# Patient Record
Sex: Male | Born: 1945 | Race: White | Hispanic: No | Marital: Married | Smoking: Never smoker
Health system: Southern US, Community
[De-identification: ages and names within clinical notes are randomized; demographics above are authoritative.]

## PROBLEM LIST (undated history)

## (undated) DIAGNOSIS — F419 Anxiety disorder, unspecified: Secondary | ICD-10-CM

## (undated) DIAGNOSIS — F329 Major depressive disorder, single episode, unspecified: Secondary | ICD-10-CM

## (undated) DIAGNOSIS — F32A Depression, unspecified: Secondary | ICD-10-CM

## (undated) DIAGNOSIS — G4733 Obstructive sleep apnea (adult) (pediatric): Secondary | ICD-10-CM

## (undated) DIAGNOSIS — M1990 Unspecified osteoarthritis, unspecified site: Secondary | ICD-10-CM

## (undated) DIAGNOSIS — I509 Heart failure, unspecified: Secondary | ICD-10-CM

## (undated) DIAGNOSIS — N179 Acute kidney failure, unspecified: Secondary | ICD-10-CM

## (undated) DIAGNOSIS — I4891 Unspecified atrial fibrillation: Secondary | ICD-10-CM

## (undated) DIAGNOSIS — I10 Essential (primary) hypertension: Secondary | ICD-10-CM

## (undated) DIAGNOSIS — G20A1 Parkinson's disease without dyskinesia, without mention of fluctuations: Secondary | ICD-10-CM

## (undated) DIAGNOSIS — N319 Neuromuscular dysfunction of bladder, unspecified: Secondary | ICD-10-CM

## (undated) DIAGNOSIS — F209 Schizophrenia, unspecified: Secondary | ICD-10-CM

## (undated) DIAGNOSIS — R569 Unspecified convulsions: Secondary | ICD-10-CM

## (undated) HISTORY — PX: TONSILLECTOMY: SUR1361

## (undated) HISTORY — PX: COLONOSCOPY: SHX174

## (undated) HISTORY — PX: CHOLECYSTECTOMY: SHX55

## (undated) HISTORY — DX: Unspecified convulsions: R56.9

## (undated) HISTORY — DX: Anxiety disorder, unspecified: F41.9

---

## 2002-02-03 ENCOUNTER — Ambulatory Visit (HOSPITAL_COMMUNITY): Admission: RE | Admit: 2002-02-03 | Discharge: 2002-02-03 | Payer: Self-pay | Admitting: Internal Medicine

## 2002-02-04 ENCOUNTER — Observation Stay (HOSPITAL_COMMUNITY): Admission: RE | Admit: 2002-02-04 | Discharge: 2002-02-05 | Payer: Self-pay | Admitting: General Surgery

## 2013-06-10 ENCOUNTER — Encounter (INDEPENDENT_AMBULATORY_CARE_PROVIDER_SITE_OTHER): Payer: Self-pay | Admitting: *Deleted

## 2013-06-14 ENCOUNTER — Other Ambulatory Visit (INDEPENDENT_AMBULATORY_CARE_PROVIDER_SITE_OTHER): Payer: Self-pay | Admitting: *Deleted

## 2013-06-14 ENCOUNTER — Telehealth (INDEPENDENT_AMBULATORY_CARE_PROVIDER_SITE_OTHER): Payer: Self-pay | Admitting: *Deleted

## 2013-06-14 ENCOUNTER — Encounter (INDEPENDENT_AMBULATORY_CARE_PROVIDER_SITE_OTHER): Payer: Self-pay | Admitting: *Deleted

## 2013-06-14 DIAGNOSIS — Z1211 Encounter for screening for malignant neoplasm of colon: Secondary | ICD-10-CM

## 2013-06-14 MED ORDER — PEG-KCL-NACL-NASULF-NA ASC-C 100 G PO SOLR: 1.0000 | Freq: Once | ORAL | Status: DC

## 2013-06-14 NOTE — Telephone Encounter (Signed)
Patient needs movi prep 

## 2013-07-07 DIAGNOSIS — R55 Syncope and collapse: Secondary | ICD-10-CM

## 2013-07-13 ENCOUNTER — Ambulatory Visit (INDEPENDENT_AMBULATORY_CARE_PROVIDER_SITE_OTHER): Payer: Medicare Other | Admitting: Radiology

## 2013-07-13 DIAGNOSIS — R404 Transient alteration of awareness: Secondary | ICD-10-CM

## 2013-07-13 NOTE — Procedures (Signed)
  History:  Cory Pacheco is a 67 year old gentleman with a history of seizures with generalized tonic-clonic episodes, new onset. The patient was just discharged from the hospital on 07/08/2013 after having a generalized seizure at home. The patient is being evaluated for this.  This is a routine EEG. No skull defects are noted. Medications include aspirin, Depakote, folic acid, hydrochlorothiazide, potassium, lisinopril, Norvasc, Pravachol, Risperdal, and Xanax.   EEG classification: Normal awake  Description of the recording: The background rhythms of this recording consists of a fairly well modulated medium amplitude alpha rhythm of 8 Hz that is reactive to eye opening and closure. As the record progresses, the patient appears to remain in the waking state throughout the recording. Photic stimulation was performed, resulting in a bilateral and symmetric photic driving response. Hyperventilation was also performed, resulting in a minimal buildup of the background rhythm activities without significant slowing seen. At no time during the recording does there appear to be evidence of spike or spike wave discharges or evidence of focal slowing. EKG monitor shows no evidence of cardiac rhythm abnormalities with a heart rate of 84.  Impression: This is a normal EEG recording in the waking state. No evidence of ictal or interictal discharges are seen.

## 2013-07-14 ENCOUNTER — Telehealth (INDEPENDENT_AMBULATORY_CARE_PROVIDER_SITE_OTHER): Payer: Self-pay | Admitting: *Deleted

## 2013-07-14 NOTE — Telephone Encounter (Signed)
  Procedure: tcs  Reason/Indication:  screening  Has patient had this procedure before?  Yes, 11 yrs ago  If so, when, by whom and where?    Is there a family history of colon cancer?  no  Who?  What age when diagnosed?    Is patient diabetic?   no      Does patient have prosthetic heart valve?  no  Do you have a pacemaker?  no  Has patient ever had endocarditis? no  Has patient had joint replacement within last 12 months?  no  Is patient on Coumadin, Plavix and/or Aspirin? yes  Medications: asa 81 mg daily, hctz 25 mg daily, lisinopril 10 mg 1/2 tab daily, pravastatin 10 mg daily, divalproex 500 mg daily, amlodipine 10 mg 1/2 tab daily, alprazolam 0.5 mg bid, risperidone 2 mg 1/2 tab bid, folic acid 1 mg 1 tab daily, potassium 20 meq daily, bupropion 150 mg bid, geratol plus daily, vit d 2000 mg daily, fish oil bid, glucosamine 1500 mg daily, fiber therapy prn   Allergies: nkda  Medication Adjustment: asa 2 days  Procedure date & time: 08/03/13 at 930

## 2013-07-15 NOTE — Telephone Encounter (Signed)
agree

## 2013-07-19 ENCOUNTER — Encounter (HOSPITAL_COMMUNITY): Payer: Self-pay | Admitting: Pharmacy Technician

## 2013-07-29 ENCOUNTER — Ambulatory Visit: Payer: Medicare Other | Admitting: Diagnostic Neuroimaging

## 2013-08-03 ENCOUNTER — Encounter (HOSPITAL_COMMUNITY): Payer: Self-pay | Admitting: *Deleted

## 2013-08-03 ENCOUNTER — Ambulatory Visit (HOSPITAL_COMMUNITY)
Admission: RE | Admit: 2013-08-03 | Discharge: 2013-08-03 | Disposition: A | Discharge status: Home / Self Care | Payer: Medicare Other | Source: Ambulatory Visit | Attending: Internal Medicine | Admitting: Internal Medicine

## 2013-08-03 ENCOUNTER — Encounter (HOSPITAL_COMMUNITY)
Admission: RE | Disposition: A | Discharge status: Home / Self Care | Payer: Self-pay | Source: Ambulatory Visit | Attending: Internal Medicine

## 2013-08-03 DIAGNOSIS — I10 Essential (primary) hypertension: Secondary | ICD-10-CM | POA: Insufficient documentation

## 2013-08-03 DIAGNOSIS — F329 Major depressive disorder, single episode, unspecified: Secondary | ICD-10-CM | POA: Insufficient documentation

## 2013-08-03 DIAGNOSIS — Q438 Other specified congenital malformations of intestine: Secondary | ICD-10-CM | POA: Insufficient documentation

## 2013-08-03 DIAGNOSIS — F3289 Other specified depressive episodes: Secondary | ICD-10-CM | POA: Insufficient documentation

## 2013-08-03 DIAGNOSIS — Z1211 Encounter for screening for malignant neoplasm of colon: Secondary | ICD-10-CM | POA: Insufficient documentation

## 2013-08-03 DIAGNOSIS — Z79899 Other long term (current) drug therapy: Secondary | ICD-10-CM | POA: Insufficient documentation

## 2013-08-03 DIAGNOSIS — M129 Arthropathy, unspecified: Secondary | ICD-10-CM | POA: Insufficient documentation

## 2013-08-03 HISTORY — PX: COLONOSCOPY: SHX5424

## 2013-08-03 HISTORY — DX: Depression, unspecified: F32.A

## 2013-08-03 HISTORY — DX: Major depressive disorder, single episode, unspecified: F32.9

## 2013-08-03 HISTORY — DX: Essential (primary) hypertension: I10

## 2013-08-03 HISTORY — DX: Unspecified osteoarthritis, unspecified site: M19.90

## 2013-08-03 SURGERY — COLONOSCOPY
Anesthesia: Moderate Sedation

## 2013-08-03 MED ORDER — MIDAZOLAM HCL 5 MG/5ML IJ SOLN
INTRAMUSCULAR | Status: AC
  Filled 2013-08-03: qty 10

## 2013-08-03 MED ORDER — MIDAZOLAM HCL 5 MG/5ML IJ SOLN
INTRAMUSCULAR | Status: DC | PRN
  Administered 2013-08-03: 1 mg via INTRAVENOUS
  Administered 2013-08-03 (×3): 2 mg via INTRAVENOUS

## 2013-08-03 MED ORDER — STERILE WATER FOR IRRIGATION IR SOLN
Status: DC | PRN
  Administered 2013-08-03: 09:00:00

## 2013-08-03 MED ORDER — SODIUM CHLORIDE 0.9 % IV SOLN
INTRAVENOUS | Status: DC
  Administered 2013-08-03: 09:00:00 via INTRAVENOUS

## 2013-08-03 MED ORDER — MEPERIDINE HCL 50 MG/ML IJ SOLN
INTRAMUSCULAR | Status: DC | PRN
  Administered 2013-08-03: 25 mg via INTRAVENOUS

## 2013-08-03 MED ORDER — MEPERIDINE HCL 50 MG/ML IJ SOLN
INTRAMUSCULAR | Status: AC
  Filled 2013-08-03: qty 1

## 2013-08-03 NOTE — H&P (Signed)
Cory Pacheco is an 67 y.o. male.   Chief Complaint: Patient is here for colonoscopy. HPI: Patient is 67 year old Caucasian male who presents for screening colonoscopy. His Lasix was 11 years ago. He denies abdominal pain change in his bowel habits or rectal bleeding. Family history is negative for CRC or polyps.  Past Medical History  Diagnosis Date  . Hypertension   . Depression   . Arthritis     Past Surgical History  Procedure Laterality Date  . Cholecystectomy    . Colonoscopy    . Tonsillectomy      History reviewed. No pertinent family history. Social History:  reports that he has never smoked. He does not have any smokeless tobacco history on file. His alcohol and drug histories are not on file.  Allergies: No Known Allergies  Medications Prior to Admission  Medication Sig Dispense Refill  . ALPRAZolam (XANAX) 0.25 MG tablet Take 0.25 mg by mouth 2 (two) times daily.      Marland Kitchen amLODipine (NORVASC) 2.5 MG tablet Take 1.25 mg by mouth daily.      Marland Kitchen aspirin EC 81 MG tablet Take 81 mg by mouth daily.      . Calcium Citrate (CITRACAL PO) Take 1 tablet by mouth daily.      . Cholecalciferol (VITAMIN D3) 2000 UNITS TABS Take 1 tablet by mouth daily.      . divalproex (DEPAKOTE) 125 MG DR tablet Take 125 mg by mouth at bedtime.      . folic acid (FOLVITE) 1 MG tablet Take 2 mg by mouth daily.      . Glucosamine HCl-MSM (MSM GLUCOSAMINE PO) Take 1,500 mg by mouth daily.      . hydrochlorothiazide (MICROZIDE) 12.5 MG capsule Take 12.5 mg by mouth daily.      Marland Kitchen lisinopril (PRINIVIL,ZESTRIL) 2.5 MG tablet Take 1.25 mg by mouth daily.      . peg 3350 powder (MOVIPREP) 100 G SOLR Take 1 kit (100 g total) by mouth once.  1 kit  0  . potassium chloride (MICRO-K) 10 MEQ CR capsule Take 10 mEq by mouth daily.      . pravastatin (PRAVACHOL) 10 MG tablet Take 10 mg by mouth daily.      . risperiDONE (RISPERDAL) 0.25 MG tablet Take 0.125 mg by mouth 2 (two) times daily.        No results  found for this or any previous visit (from the past 48 hour(s)). No results found.  ROS  Blood pressure 134/78, pulse 82, temperature 97.7 F (36.5 C), temperature source Oral, resp. rate 17, height 5\' 11"  (1.803 m), weight 215 lb (97.523 kg), SpO2 93.00%. Physical Exam  Constitutional: He appears well-developed and well-nourished.  HENT:  Mouth/Throat: Oropharynx is clear and moist.  Eyes: Conjunctivae are normal. No scleral icterus.  Neck: No thyromegaly present.  Cardiovascular: Normal rate, regular rhythm and normal heart sounds.   No murmur heard. Respiratory: Effort normal and breath sounds normal.  GI: Soft. He exhibits no distension and no mass. There is no tenderness.  Musculoskeletal: He exhibits no edema.  Lymphadenopathy:    He has no cervical adenopathy.  Neurological: He is alert.  Skin: Skin is warm and dry.     Assessment/Plan Average risk screening colonoscopy.  Bettyjean Stefanski U 08/03/2013, 8:56 AM

## 2013-08-03 NOTE — Op Note (Signed)
COLONOSCOPY PROCEDURE REPORT  PATIENT:  Cory Pacheco  MR#:  [id number] Birthdate:  Jan 21, 1946, 67 y.o., male Endoscopist:  Dr. Malissa Hippo, MD Referred By:  Dr. Donzetta Sprung, MD Procedure Date: 08/03/2013  Procedure:   Colonoscopy  Indications:  Patient is 67 year old Caucasian male who is undergoing average risk screening colonoscopy.  Informed Consent:  The procedure and risks were reviewed with the patient and informed consent was obtained.  Medications:  Demerol 25 mg IV Versed 7 mg IV  Description of procedure:  After a digital rectal exam was performed, that colonoscope was advanced from the anus through the rectum and colon to the area of the cecum, ileocecal valve and appendiceal orifice. The cecum was deeply intubated. These structures were well-seen and photographed for the record. From the level of the cecum and ileocecal valve, the scope was slowly and cautiously withdrawn. The mucosal surfaces were carefully surveyed utilizing scope tip to flexion to facilitate fold flattening as needed. The scope was pulled down into the rectum where a thorough exam including retroflexion was performed.  Findings:  Prep satisfactory. He had a lot of liquid stool. Redundant colon but normal mucosa throughout without polyps or diverticular changes. Normal rectal mucosa and anal rectal junction.   Therapeutic/Diagnostic Maneuvers Performed:  None  Complications:  None  Cecal Withdrawal Time:  11 minutes  Impression:  Examination performed to cecum. Redundant but otherwise normal colon.  Recommendations:  Standard instructions given. Next screening exam in 10 years.  REHMAN,NAJEEB U  08/03/2013 9:35 AM  CC: Dr. Donzetta Sprung, MD & Dr. Bonnetta Barry ref. provider found

## 2013-08-04 ENCOUNTER — Encounter (HOSPITAL_COMMUNITY): Payer: Self-pay | Admitting: Internal Medicine

## 2016-02-19 DIAGNOSIS — I10 Essential (primary) hypertension: Secondary | ICD-10-CM | POA: Diagnosis not present

## 2016-02-19 DIAGNOSIS — F3132 Bipolar disorder, current episode depressed, moderate: Secondary | ICD-10-CM | POA: Diagnosis not present

## 2016-02-19 DIAGNOSIS — E782 Mixed hyperlipidemia: Secondary | ICD-10-CM | POA: Diagnosis not present

## 2016-02-19 DIAGNOSIS — E119 Type 2 diabetes mellitus without complications: Secondary | ICD-10-CM | POA: Diagnosis not present

## 2016-02-19 DIAGNOSIS — K210 Gastro-esophageal reflux disease with esophagitis: Secondary | ICD-10-CM | POA: Diagnosis not present

## 2016-02-29 DIAGNOSIS — G40309 Generalized idiopathic epilepsy and epileptic syndromes, not intractable, without status epilepticus: Secondary | ICD-10-CM | POA: Diagnosis not present

## 2016-02-29 DIAGNOSIS — K219 Gastro-esophageal reflux disease without esophagitis: Secondary | ICD-10-CM | POA: Diagnosis not present

## 2016-02-29 DIAGNOSIS — F3132 Bipolar disorder, current episode depressed, moderate: Secondary | ICD-10-CM | POA: Diagnosis not present

## 2016-02-29 DIAGNOSIS — E782 Mixed hyperlipidemia: Secondary | ICD-10-CM | POA: Diagnosis not present

## 2016-02-29 DIAGNOSIS — I10 Essential (primary) hypertension: Secondary | ICD-10-CM | POA: Diagnosis not present

## 2016-02-29 DIAGNOSIS — Z0001 Encounter for general adult medical examination with abnormal findings: Secondary | ICD-10-CM | POA: Diagnosis not present

## 2016-02-29 DIAGNOSIS — Z1212 Encounter for screening for malignant neoplasm of rectum: Secondary | ICD-10-CM | POA: Diagnosis not present

## 2016-06-27 DIAGNOSIS — F3132 Bipolar disorder, current episode depressed, moderate: Secondary | ICD-10-CM | POA: Diagnosis not present

## 2016-06-27 DIAGNOSIS — E119 Type 2 diabetes mellitus without complications: Secondary | ICD-10-CM | POA: Diagnosis not present

## 2016-06-27 DIAGNOSIS — G40309 Generalized idiopathic epilepsy and epileptic syndromes, not intractable, without status epilepticus: Secondary | ICD-10-CM | POA: Diagnosis not present

## 2016-06-27 DIAGNOSIS — I10 Essential (primary) hypertension: Secondary | ICD-10-CM | POA: Diagnosis not present

## 2016-06-27 DIAGNOSIS — K210 Gastro-esophageal reflux disease with esophagitis: Secondary | ICD-10-CM | POA: Diagnosis not present

## 2016-06-27 DIAGNOSIS — E782 Mixed hyperlipidemia: Secondary | ICD-10-CM | POA: Diagnosis not present

## 2016-06-30 DIAGNOSIS — K219 Gastro-esophageal reflux disease without esophagitis: Secondary | ICD-10-CM | POA: Diagnosis not present

## 2016-06-30 DIAGNOSIS — G4733 Obstructive sleep apnea (adult) (pediatric): Secondary | ICD-10-CM | POA: Diagnosis not present

## 2016-06-30 DIAGNOSIS — F3132 Bipolar disorder, current episode depressed, moderate: Secondary | ICD-10-CM | POA: Diagnosis not present

## 2016-06-30 DIAGNOSIS — G40309 Generalized idiopathic epilepsy and epileptic syndromes, not intractable, without status epilepticus: Secondary | ICD-10-CM | POA: Diagnosis not present

## 2016-06-30 DIAGNOSIS — I10 Essential (primary) hypertension: Secondary | ICD-10-CM | POA: Diagnosis not present

## 2016-06-30 DIAGNOSIS — E782 Mixed hyperlipidemia: Secondary | ICD-10-CM | POA: Diagnosis not present

## 2016-06-30 DIAGNOSIS — E119 Type 2 diabetes mellitus without complications: Secondary | ICD-10-CM | POA: Diagnosis not present

## 2016-06-30 DIAGNOSIS — Z6836 Body mass index (BMI) 36.0-36.9, adult: Secondary | ICD-10-CM | POA: Diagnosis not present

## 2016-09-29 DIAGNOSIS — Z23 Encounter for immunization: Secondary | ICD-10-CM | POA: Diagnosis not present

## 2016-10-27 DIAGNOSIS — E119 Type 2 diabetes mellitus without complications: Secondary | ICD-10-CM | POA: Diagnosis not present

## 2016-10-27 DIAGNOSIS — I10 Essential (primary) hypertension: Secondary | ICD-10-CM | POA: Diagnosis not present

## 2016-10-27 DIAGNOSIS — E782 Mixed hyperlipidemia: Secondary | ICD-10-CM | POA: Diagnosis not present

## 2016-10-31 DIAGNOSIS — K219 Gastro-esophageal reflux disease without esophagitis: Secondary | ICD-10-CM | POA: Diagnosis not present

## 2016-10-31 DIAGNOSIS — E782 Mixed hyperlipidemia: Secondary | ICD-10-CM | POA: Diagnosis not present

## 2016-10-31 DIAGNOSIS — E119 Type 2 diabetes mellitus without complications: Secondary | ICD-10-CM | POA: Diagnosis not present

## 2016-10-31 DIAGNOSIS — Z6836 Body mass index (BMI) 36.0-36.9, adult: Secondary | ICD-10-CM | POA: Diagnosis not present

## 2016-10-31 DIAGNOSIS — F3132 Bipolar disorder, current episode depressed, moderate: Secondary | ICD-10-CM | POA: Diagnosis not present

## 2016-10-31 DIAGNOSIS — G40309 Generalized idiopathic epilepsy and epileptic syndromes, not intractable, without status epilepticus: Secondary | ICD-10-CM | POA: Diagnosis not present

## 2016-10-31 DIAGNOSIS — I10 Essential (primary) hypertension: Secondary | ICD-10-CM | POA: Diagnosis not present

## 2016-10-31 DIAGNOSIS — G4733 Obstructive sleep apnea (adult) (pediatric): Secondary | ICD-10-CM | POA: Diagnosis not present

## 2016-11-15 DIAGNOSIS — E7800 Pure hypercholesterolemia, unspecified: Secondary | ICD-10-CM | POA: Diagnosis not present

## 2016-11-15 DIAGNOSIS — R7989 Other specified abnormal findings of blood chemistry: Secondary | ICD-10-CM | POA: Diagnosis not present

## 2016-11-15 DIAGNOSIS — D529 Folate deficiency anemia, unspecified: Secondary | ICD-10-CM | POA: Diagnosis not present

## 2016-11-15 DIAGNOSIS — I10 Essential (primary) hypertension: Secondary | ICD-10-CM | POA: Diagnosis not present

## 2016-11-15 DIAGNOSIS — F209 Schizophrenia, unspecified: Secondary | ICD-10-CM | POA: Diagnosis not present

## 2016-11-15 DIAGNOSIS — F319 Bipolar disorder, unspecified: Secondary | ICD-10-CM | POA: Diagnosis not present

## 2016-11-15 DIAGNOSIS — J9811 Atelectasis: Secondary | ICD-10-CM | POA: Diagnosis not present

## 2016-11-15 DIAGNOSIS — Z79899 Other long term (current) drug therapy: Secondary | ICD-10-CM | POA: Diagnosis not present

## 2016-11-15 DIAGNOSIS — R569 Unspecified convulsions: Secondary | ICD-10-CM | POA: Diagnosis not present

## 2016-11-15 DIAGNOSIS — G40409 Other generalized epilepsy and epileptic syndromes, not intractable, without status epilepticus: Secondary | ICD-10-CM | POA: Diagnosis not present

## 2016-11-17 DIAGNOSIS — Z6835 Body mass index (BMI) 35.0-35.9, adult: Secondary | ICD-10-CM | POA: Diagnosis not present

## 2016-11-17 DIAGNOSIS — G40309 Generalized idiopathic epilepsy and epileptic syndromes, not intractable, without status epilepticus: Secondary | ICD-10-CM | POA: Diagnosis not present

## 2016-11-24 DIAGNOSIS — G40309 Generalized idiopathic epilepsy and epileptic syndromes, not intractable, without status epilepticus: Secondary | ICD-10-CM | POA: Diagnosis not present

## 2016-11-24 DIAGNOSIS — E782 Mixed hyperlipidemia: Secondary | ICD-10-CM | POA: Diagnosis not present

## 2016-11-24 DIAGNOSIS — F3132 Bipolar disorder, current episode depressed, moderate: Secondary | ICD-10-CM | POA: Diagnosis not present

## 2016-11-24 DIAGNOSIS — I10 Essential (primary) hypertension: Secondary | ICD-10-CM | POA: Diagnosis not present

## 2016-11-24 DIAGNOSIS — E1065 Type 1 diabetes mellitus with hyperglycemia: Secondary | ICD-10-CM | POA: Diagnosis not present

## 2016-11-24 DIAGNOSIS — Z9189 Other specified personal risk factors, not elsewhere classified: Secondary | ICD-10-CM | POA: Diagnosis not present

## 2016-11-24 DIAGNOSIS — K210 Gastro-esophageal reflux disease with esophagitis: Secondary | ICD-10-CM | POA: Diagnosis not present

## 2017-02-01 DIAGNOSIS — Z79899 Other long term (current) drug therapy: Secondary | ICD-10-CM | POA: Diagnosis not present

## 2017-02-01 DIAGNOSIS — F209 Schizophrenia, unspecified: Secondary | ICD-10-CM | POA: Diagnosis not present

## 2017-02-01 DIAGNOSIS — Z882 Allergy status to sulfonamides status: Secondary | ICD-10-CM | POA: Diagnosis not present

## 2017-02-01 DIAGNOSIS — R42 Dizziness and giddiness: Secondary | ICD-10-CM | POA: Diagnosis not present

## 2017-02-01 DIAGNOSIS — E785 Hyperlipidemia, unspecified: Secondary | ICD-10-CM | POA: Diagnosis not present

## 2017-02-01 DIAGNOSIS — W19XXXA Unspecified fall, initial encounter: Secondary | ICD-10-CM | POA: Diagnosis not present

## 2017-02-01 DIAGNOSIS — R410 Disorientation, unspecified: Secondary | ICD-10-CM | POA: Diagnosis not present

## 2017-02-01 DIAGNOSIS — G40409 Other generalized epilepsy and epileptic syndromes, not intractable, without status epilepticus: Secondary | ICD-10-CM | POA: Diagnosis not present

## 2017-02-01 DIAGNOSIS — Z7982 Long term (current) use of aspirin: Secondary | ICD-10-CM | POA: Diagnosis not present

## 2017-02-01 DIAGNOSIS — Z818 Family history of other mental and behavioral disorders: Secondary | ICD-10-CM | POA: Diagnosis not present

## 2017-02-01 DIAGNOSIS — I10 Essential (primary) hypertension: Secondary | ICD-10-CM | POA: Diagnosis not present

## 2017-02-01 DIAGNOSIS — Z9049 Acquired absence of other specified parts of digestive tract: Secondary | ICD-10-CM | POA: Diagnosis not present

## 2017-02-01 DIAGNOSIS — Z981 Arthrodesis status: Secondary | ICD-10-CM | POA: Diagnosis not present

## 2017-02-01 DIAGNOSIS — E876 Hypokalemia: Secondary | ICD-10-CM | POA: Diagnosis not present

## 2017-02-01 DIAGNOSIS — Z809 Family history of malignant neoplasm, unspecified: Secondary | ICD-10-CM | POA: Diagnosis not present

## 2017-02-01 DIAGNOSIS — Z85828 Personal history of other malignant neoplasm of skin: Secondary | ICD-10-CM | POA: Diagnosis not present

## 2017-02-01 DIAGNOSIS — Z888 Allergy status to other drugs, medicaments and biological substances status: Secondary | ICD-10-CM | POA: Diagnosis not present

## 2017-02-01 DIAGNOSIS — M4810 Ankylosing hyperostosis [Forestier], site unspecified: Secondary | ICD-10-CM | POA: Diagnosis not present

## 2017-02-01 DIAGNOSIS — E119 Type 2 diabetes mellitus without complications: Secondary | ICD-10-CM | POA: Diagnosis not present

## 2017-02-01 DIAGNOSIS — E871 Hypo-osmolality and hyponatremia: Secondary | ICD-10-CM | POA: Diagnosis not present

## 2017-02-01 DIAGNOSIS — Z9852 Vasectomy status: Secondary | ICD-10-CM | POA: Diagnosis not present

## 2017-02-01 DIAGNOSIS — R270 Ataxia, unspecified: Secondary | ICD-10-CM | POA: Diagnosis not present

## 2017-02-01 DIAGNOSIS — D72819 Decreased white blood cell count, unspecified: Secondary | ICD-10-CM | POA: Diagnosis not present

## 2017-02-01 DIAGNOSIS — S5012XA Contusion of left forearm, initial encounter: Secondary | ICD-10-CM | POA: Diagnosis not present

## 2017-02-03 DIAGNOSIS — E871 Hypo-osmolality and hyponatremia: Secondary | ICD-10-CM | POA: Diagnosis not present

## 2017-02-27 DIAGNOSIS — E782 Mixed hyperlipidemia: Secondary | ICD-10-CM | POA: Diagnosis not present

## 2017-02-27 DIAGNOSIS — E1065 Type 1 diabetes mellitus with hyperglycemia: Secondary | ICD-10-CM | POA: Diagnosis not present

## 2017-02-27 DIAGNOSIS — I10 Essential (primary) hypertension: Secondary | ICD-10-CM | POA: Diagnosis not present

## 2017-02-27 DIAGNOSIS — K210 Gastro-esophageal reflux disease with esophagitis: Secondary | ICD-10-CM | POA: Diagnosis not present

## 2017-02-27 DIAGNOSIS — G4733 Obstructive sleep apnea (adult) (pediatric): Secondary | ICD-10-CM | POA: Diagnosis not present

## 2017-02-27 DIAGNOSIS — Z9189 Other specified personal risk factors, not elsewhere classified: Secondary | ICD-10-CM | POA: Diagnosis not present

## 2017-02-27 DIAGNOSIS — F3132 Bipolar disorder, current episode depressed, moderate: Secondary | ICD-10-CM | POA: Diagnosis not present

## 2017-02-27 DIAGNOSIS — G40309 Generalized idiopathic epilepsy and epileptic syndromes, not intractable, without status epilepticus: Secondary | ICD-10-CM | POA: Diagnosis not present

## 2017-03-03 DIAGNOSIS — Z1212 Encounter for screening for malignant neoplasm of rectum: Secondary | ICD-10-CM | POA: Diagnosis not present

## 2017-03-03 DIAGNOSIS — I10 Essential (primary) hypertension: Secondary | ICD-10-CM | POA: Diagnosis not present

## 2017-03-03 DIAGNOSIS — K219 Gastro-esophageal reflux disease without esophagitis: Secondary | ICD-10-CM | POA: Diagnosis not present

## 2017-03-03 DIAGNOSIS — E782 Mixed hyperlipidemia: Secondary | ICD-10-CM | POA: Diagnosis not present

## 2017-03-03 DIAGNOSIS — G40309 Generalized idiopathic epilepsy and epileptic syndromes, not intractable, without status epilepticus: Secondary | ICD-10-CM | POA: Diagnosis not present

## 2017-03-03 DIAGNOSIS — E119 Type 2 diabetes mellitus without complications: Secondary | ICD-10-CM | POA: Diagnosis not present

## 2017-03-03 DIAGNOSIS — F3132 Bipolar disorder, current episode depressed, moderate: Secondary | ICD-10-CM | POA: Diagnosis not present

## 2017-03-03 DIAGNOSIS — Z0001 Encounter for general adult medical examination with abnormal findings: Secondary | ICD-10-CM | POA: Diagnosis not present

## 2017-03-05 DIAGNOSIS — G40309 Generalized idiopathic epilepsy and epileptic syndromes, not intractable, without status epilepticus: Secondary | ICD-10-CM | POA: Diagnosis not present

## 2017-03-05 DIAGNOSIS — Z6834 Body mass index (BMI) 34.0-34.9, adult: Secondary | ICD-10-CM | POA: Diagnosis not present

## 2017-03-05 DIAGNOSIS — M4810 Ankylosing hyperostosis [Forestier], site unspecified: Secondary | ICD-10-CM | POA: Diagnosis not present

## 2017-04-26 DIAGNOSIS — R569 Unspecified convulsions: Secondary | ICD-10-CM | POA: Diagnosis not present

## 2017-04-26 DIAGNOSIS — Z79899 Other long term (current) drug therapy: Secondary | ICD-10-CM | POA: Diagnosis not present

## 2017-04-26 DIAGNOSIS — E7800 Pure hypercholesterolemia, unspecified: Secondary | ICD-10-CM | POA: Diagnosis not present

## 2017-04-26 DIAGNOSIS — F419 Anxiety disorder, unspecified: Secondary | ICD-10-CM | POA: Diagnosis not present

## 2017-04-26 DIAGNOSIS — I10 Essential (primary) hypertension: Secondary | ICD-10-CM | POA: Diagnosis not present

## 2017-04-26 DIAGNOSIS — G40409 Other generalized epilepsy and epileptic syndromes, not intractable, without status epilepticus: Secondary | ICD-10-CM | POA: Diagnosis not present

## 2017-05-05 ENCOUNTER — Inpatient Hospital Stay (HOSPITAL_COMMUNITY): Payer: PPO

## 2017-05-05 ENCOUNTER — Inpatient Hospital Stay (HOSPITAL_COMMUNITY)
Admission: AD | Admit: 2017-05-05 | Discharge: 2017-05-12 | DRG: 100 | Disposition: A | Discharge status: Skilled Nursing Facility | Payer: PPO | Source: Other Acute Inpatient Hospital | Attending: Internal Medicine | Admitting: Internal Medicine

## 2017-05-05 ENCOUNTER — Encounter (HOSPITAL_COMMUNITY): Payer: Self-pay

## 2017-05-05 DIAGNOSIS — K219 Gastro-esophageal reflux disease without esophagitis: Secondary | ICD-10-CM | POA: Diagnosis present

## 2017-05-05 DIAGNOSIS — E7800 Pure hypercholesterolemia, unspecified: Secondary | ICD-10-CM | POA: Diagnosis not present

## 2017-05-05 DIAGNOSIS — Z7982 Long term (current) use of aspirin: Secondary | ICD-10-CM

## 2017-05-05 DIAGNOSIS — M6281 Muscle weakness (generalized): Secondary | ICD-10-CM | POA: Diagnosis not present

## 2017-05-05 DIAGNOSIS — M1990 Unspecified osteoarthritis, unspecified site: Secondary | ICD-10-CM | POA: Diagnosis present

## 2017-05-05 DIAGNOSIS — E119 Type 2 diabetes mellitus without complications: Secondary | ICD-10-CM | POA: Diagnosis not present

## 2017-05-05 DIAGNOSIS — J9600 Acute respiratory failure, unspecified whether with hypoxia or hypercapnia: Secondary | ICD-10-CM | POA: Diagnosis not present

## 2017-05-05 DIAGNOSIS — Z8249 Family history of ischemic heart disease and other diseases of the circulatory system: Secondary | ICD-10-CM | POA: Diagnosis not present

## 2017-05-05 DIAGNOSIS — Z4659 Encounter for fitting and adjustment of other gastrointestinal appliance and device: Secondary | ICD-10-CM | POA: Diagnosis not present

## 2017-05-05 DIAGNOSIS — G40909 Epilepsy, unspecified, not intractable, without status epilepticus: Secondary | ICD-10-CM | POA: Diagnosis not present

## 2017-05-05 DIAGNOSIS — R569 Unspecified convulsions: Secondary | ICD-10-CM

## 2017-05-05 DIAGNOSIS — J9690 Respiratory failure, unspecified, unspecified whether with hypoxia or hypercapnia: Secondary | ICD-10-CM | POA: Diagnosis not present

## 2017-05-05 DIAGNOSIS — R2681 Unsteadiness on feet: Secondary | ICD-10-CM | POA: Diagnosis not present

## 2017-05-05 DIAGNOSIS — E785 Hyperlipidemia, unspecified: Secondary | ICD-10-CM | POA: Diagnosis present

## 2017-05-05 DIAGNOSIS — Z9049 Acquired absence of other specified parts of digestive tract: Secondary | ICD-10-CM | POA: Diagnosis not present

## 2017-05-05 DIAGNOSIS — Z79899 Other long term (current) drug therapy: Secondary | ICD-10-CM

## 2017-05-05 DIAGNOSIS — Z4682 Encounter for fitting and adjustment of non-vascular catheter: Secondary | ICD-10-CM | POA: Diagnosis not present

## 2017-05-05 DIAGNOSIS — E872 Acidosis: Secondary | ICD-10-CM | POA: Diagnosis present

## 2017-05-05 DIAGNOSIS — E876 Hypokalemia: Secondary | ICD-10-CM | POA: Diagnosis present

## 2017-05-05 DIAGNOSIS — I10 Essential (primary) hypertension: Secondary | ICD-10-CM | POA: Diagnosis present

## 2017-05-05 DIAGNOSIS — R4182 Altered mental status, unspecified: Secondary | ICD-10-CM | POA: Diagnosis not present

## 2017-05-05 DIAGNOSIS — R1312 Dysphagia, oropharyngeal phase: Secondary | ICD-10-CM | POA: Diagnosis not present

## 2017-05-05 DIAGNOSIS — L89151 Pressure ulcer of sacral region, stage 1: Secondary | ICD-10-CM | POA: Diagnosis present

## 2017-05-05 DIAGNOSIS — G40901 Epilepsy, unspecified, not intractable, with status epilepticus: Principal | ICD-10-CM

## 2017-05-05 DIAGNOSIS — L8990 Pressure ulcer of unspecified site, unspecified stage: Secondary | ICD-10-CM | POA: Insufficient documentation

## 2017-05-05 DIAGNOSIS — R34 Anuria and oliguria: Secondary | ICD-10-CM | POA: Diagnosis not present

## 2017-05-05 DIAGNOSIS — F419 Anxiety disorder, unspecified: Secondary | ICD-10-CM | POA: Diagnosis not present

## 2017-05-05 DIAGNOSIS — F319 Bipolar disorder, unspecified: Secondary | ICD-10-CM | POA: Diagnosis not present

## 2017-05-05 DIAGNOSIS — R41841 Cognitive communication deficit: Secondary | ICD-10-CM | POA: Diagnosis not present

## 2017-05-05 DIAGNOSIS — G9340 Encephalopathy, unspecified: Secondary | ICD-10-CM | POA: Diagnosis not present

## 2017-05-05 DIAGNOSIS — G4700 Insomnia, unspecified: Secondary | ICD-10-CM | POA: Diagnosis not present

## 2017-05-05 DIAGNOSIS — R410 Disorientation, unspecified: Secondary | ICD-10-CM | POA: Diagnosis not present

## 2017-05-05 LAB — CBC WITH DIFFERENTIAL/PLATELET
Basophils Absolute: 0 10*3/uL (ref 0.0–0.1)
Basophils Relative: 0 %
Eosinophils Absolute: 0.1 10*3/uL (ref 0.0–0.7)
Eosinophils Relative: 1 %
HCT: 38.9 % — ABNORMAL LOW (ref 39.0–52.0)
Hemoglobin: 13.4 g/dL (ref 13.0–17.0)
Lymphocytes Relative: 20 %
Lymphs Abs: 1.6 10*3/uL (ref 0.7–4.0)
MCH: 33 pg (ref 26.0–34.0)
MCHC: 34.4 g/dL (ref 30.0–36.0)
MCV: 95.8 fL (ref 78.0–100.0)
Monocytes Absolute: 0.6 10*3/uL (ref 0.1–1.0)
Monocytes Relative: 8 %
Neutro Abs: 5.8 10*3/uL (ref 1.7–7.7)
Neutrophils Relative %: 71 %
Platelets: 199 10*3/uL (ref 150–400)
RBC: 4.06 MIL/uL — ABNORMAL LOW (ref 4.22–5.81)
RDW: 13 % (ref 11.5–15.5)
WBC: 8.2 10*3/uL (ref 4.0–10.5)

## 2017-05-05 LAB — COMPREHENSIVE METABOLIC PANEL
ALT: 17 U/L (ref 17–63)
AST: 24 U/L (ref 15–41)
Albumin: 3.2 g/dL — ABNORMAL LOW (ref 3.5–5.0)
Alkaline Phosphatase: 48 U/L (ref 38–126)
Anion gap: 8 (ref 5–15)
BUN: 7 mg/dL (ref 6–20)
CO2: 23 mmol/L (ref 22–32)
Calcium: 8.5 mg/dL — ABNORMAL LOW (ref 8.9–10.3)
Chloride: 103 mmol/L (ref 101–111)
Creatinine, Ser: 0.86 mg/dL (ref 0.61–1.24)
GFR calc Af Amer: >60 mL/min (ref >60)
GFR calc non Af Amer: >60 mL/min (ref >60)
Glucose, Bld: 116 mg/dL — ABNORMAL HIGH (ref 65–99)
Potassium: 3.4 mmol/L — ABNORMAL LOW (ref 3.5–5.1)
Sodium: 134 mmol/L — ABNORMAL LOW (ref 135–145)
Total Bilirubin: 0.8 mg/dL (ref 0.3–1.2)
Total Protein: 5.9 g/dL — ABNORMAL LOW (ref 6.5–8.1)

## 2017-05-05 LAB — BLOOD GAS, ARTERIAL
Acid-base deficit: 1.4 mmol/L (ref 0.0–2.0)
Bicarbonate: 22.1 mmol/L (ref 20.0–28.0)
Drawn by: 441371
FIO2: 40
MECHVT: 550 mL
O2 Saturation: 99.3 %
PEEP: 5 cmH2O
Patient temperature: 98.4
RATE: 24 resp/min
pCO2 arterial: 32.9 mmHg (ref 32.0–48.0)
pH, Arterial: 7.443 (ref 7.350–7.450)
pO2, Arterial: 180 mmHg — ABNORMAL HIGH (ref 83.0–108.0)

## 2017-05-05 LAB — URINALYSIS, ROUTINE W REFLEX MICROSCOPIC
Bacteria, UA: NONE SEEN
Bilirubin Urine: NEGATIVE
Glucose, UA: NEGATIVE mg/dL
Ketones, ur: 5 mg/dL — AB
Leukocytes, UA: NEGATIVE
Nitrite: NEGATIVE
Protein, ur: 30 mg/dL — AB
Specific Gravity, Urine: 1.015 (ref 1.005–1.030)
pH: 6 (ref 5.0–8.0)

## 2017-05-05 LAB — PROTIME-INR
INR: 1.15
Prothrombin Time: 14.8 seconds (ref 11.4–15.2)

## 2017-05-05 LAB — LACTIC ACID, PLASMA: Lactic Acid, Venous: 2.3 mmol/L (ref 0.5–1.9)

## 2017-05-05 LAB — PHOSPHORUS: Phosphorus: 2.1 mg/dL — ABNORMAL LOW (ref 2.5–4.6)

## 2017-05-05 LAB — PROCALCITONIN: Procalcitonin: <0.1 ng/mL

## 2017-05-05 LAB — MAGNESIUM: Magnesium: 1.8 mg/dL (ref 1.7–2.4)

## 2017-05-05 LAB — MRSA PCR SCREENING: MRSA by PCR: NEGATIVE

## 2017-05-05 LAB — VALPROIC ACID LEVEL: Valproic Acid Lvl: 60 ug/mL (ref 50.0–100.0)

## 2017-05-05 MED ORDER — SODIUM CHLORIDE 0.9 % IV SOLN
INTRAVENOUS | Status: DC
  Administered 2017-05-05 (×2): via INTRAVENOUS

## 2017-05-05 MED ORDER — SODIUM CHLORIDE 0.9 % IV SOLN
500.0000 mg | Freq: Two times a day (BID) | INTRAVENOUS | Status: DC
  Administered 2017-05-05 – 2017-05-06 (×2): 500 mg via INTRAVENOUS
  Filled 2017-05-05 (×2): qty 5

## 2017-05-05 MED ORDER — MIDAZOLAM HCL 2 MG/2ML IJ SOLN
1.0000 mg | INTRAMUSCULAR | Status: DC | PRN
  Administered 2017-05-05 – 2017-05-06 (×2): 1 mg via INTRAVENOUS
  Filled 2017-05-05 (×2): qty 2

## 2017-05-05 MED ORDER — ORAL CARE MOUTH RINSE
15.0000 mL | OROMUCOSAL | Status: DC
  Administered 2017-05-05 – 2017-05-06 (×12): 15 mL via OROMUCOSAL

## 2017-05-05 MED ORDER — POTASSIUM CHLORIDE 10 MEQ/100ML IV SOLN
10.0000 meq | INTRAVENOUS | Status: AC
  Administered 2017-05-05 – 2017-05-06 (×4): 10 meq via INTRAVENOUS
  Filled 2017-05-05: qty 100

## 2017-05-05 MED ORDER — SODIUM CHLORIDE 0.9 % IV SOLN: 500.0000 mg | Freq: Two times a day (BID) | INTRAVENOUS | Status: DC

## 2017-05-05 MED ORDER — MIDAZOLAM HCL 2 MG/2ML IJ SOLN
1.0000 mg | INTRAMUSCULAR | Status: DC | PRN
  Administered 2017-05-06 (×2): 1 mg via INTRAVENOUS
  Filled 2017-05-05 (×3): qty 2

## 2017-05-05 MED ORDER — PROPOFOL 1000 MG/100ML IV EMUL
5.0000 ug/kg/min | INTRAVENOUS | Status: DC
  Administered 2017-05-05 (×3): 41.36 ug/kg/min via INTRAVENOUS
  Administered 2017-05-05: 45 ug/kg/min via INTRAVENOUS
  Administered 2017-05-05: 41.36 ug/kg/min via INTRAVENOUS
  Administered 2017-05-06: 40 ug/kg/min via INTRAVENOUS
  Administered 2017-05-06: 35 ug/kg/min via INTRAVENOUS
  Administered 2017-05-06: 50 ug/kg/min via INTRAVENOUS
  Administered 2017-05-06: 40 ug/kg/min via INTRAVENOUS
  Filled 2017-05-05 (×9): qty 100

## 2017-05-05 MED ORDER — ACETAMINOPHEN 325 MG PO TABS: 650.0000 mg | ORAL_TABLET | ORAL | Status: DC | PRN

## 2017-05-05 MED ORDER — CHLORHEXIDINE GLUCONATE 0.12% ORAL RINSE (MEDLINE KIT)
15.0000 mL | Freq: Two times a day (BID) | OROMUCOSAL | Status: DC
  Administered 2017-05-05 – 2017-05-06 (×2): 15 mL via OROMUCOSAL

## 2017-05-05 MED ORDER — PANTOPRAZOLE SODIUM 40 MG IV SOLR
40.0000 mg | Freq: Every day | INTRAVENOUS | Status: DC
  Administered 2017-05-05 – 2017-05-08 (×4): 40 mg via INTRAVENOUS
  Filled 2017-05-05 (×5): qty 40

## 2017-05-05 MED ORDER — MIDAZOLAM HCL 5 MG/ML IJ SOLN
0.0000 mg/h | INTRAMUSCULAR | Status: DC
  Filled 2017-05-05: qty 10

## 2017-05-05 MED ORDER — HEPARIN SODIUM (PORCINE) 5000 UNIT/ML IJ SOLN
5000.0000 [IU] | Freq: Three times a day (TID) | INTRAMUSCULAR | Status: DC
  Administered 2017-05-05 – 2017-05-12 (×23): 5000 [IU] via SUBCUTANEOUS
  Filled 2017-05-05 (×23): qty 1

## 2017-05-05 NOTE — Progress Notes (Signed)
Critical Lctic Acid of 2.3 Paged and spoke with Georgann Housekeeper PA. No orders, level is greatly improved from admission.

## 2017-05-05 NOTE — Care Management Note (Signed)
Case Management Note  Patient Details  Name: Cory Pacheco MRN: [id number] Date of Birth: Apr 09, 1946  Subjective/Objective:  Pt admitted on 05/05/17 with confusion and seizure activity.  PTA, pt independent and living at home with spouse.  [facility], but wife reports he does not have a neurologist.                    Action/Plan: Pt currently remains intubated.  Will follow for discharge planning as pt progresses.    Expected Discharge Date:       Expected Discharge Plan:     In-House Referral:     Discharge planning Services  CM Consult  Post Acute Care Choice:    Choice offered to:     DME Arranged:    DME Agency:     HH Arranged:    HH Agency:     Status of Service:  In process, will continue to follow  If discussed at Long Length of Stay Meetings, dates discussed:    Additional Comments:  Reinaldo Raddle, RN, BSN  Trauma/Neuro ICU Case Manager [phone]

## 2017-05-05 NOTE — Progress Notes (Signed)
Midazolam from [facility] ER wasted down sink. 150 ml waste witnessed by [facility].

## 2017-05-05 NOTE — Procedures (Signed)
ELECTROENCEPHALOGRAM REPORT  Date of Study: 05/05/2017  Patient's Name: Cory Pacheco MRN: [id number] Date of Birth: 09/25/1946  Referring Provider: Dr. Roland Rack  Clinical History: This is a 70 year old man with seizures.  Medications: levETIRAcetam (KEPPRA) 500 mg in sodium chloride 0.9 % 100 mL IVPB  acetaminophen (TYLENOL) tablet 650 mg  propofol (DIPRIVAN) 1000 MG/100ML infusion  pantoprazole (PROTONIX) injection 40 mg   Technical Summary: A multichannel digital EEG recording measured by the international 10-20 system with electrodes applied with paste and impedances below 5000 ohms performed as portable with EKG monitoring in an intubated and sedated patient.  Hyperventilation and photic stimulation were not performed.  The digital EEG was referentially recorded, reformatted, and digitally filtered in a variety of bipolar and referential montages for optimal display.   Description: The patient is intubated and sedated on Propofol during the recording. There is no clear posterior dominant rhythm. The background consists of a large amount of diffuse 4-5 Hz theta and 2-3 Hz delta slowing admixed with diffuse alpha and beta activity. With noxious stimulation, there is slight increase in faster frequencies and muscle artifact. Normal sleep architecture is not seen. Hyperventilation and photic stimulation were not performed.  There were no epileptiform discharges or electrographic seizures seen.    EKG lead was unremarkable.  Impression: This sedated EEG is abnormal due to diffuse background slowing.  Clinical Correlation of the above findings indicates diffuse cerebral dysfunction that is non-specific in etiology and can be seen with hypoxic/ischemic injury, toxic/metabolic encephalopathies, or medication effect from Propofol. There were no electrographic seizures seen in this study.  The absence of epileptiform discharges does not rule out a clinical diagnosis of epilepsy.   Clinical correlation is advised.   Ellouise Newer, M.D.

## 2017-05-05 NOTE — H&P (Addendum)
PULMONARY / CRITICAL CARE MEDICINE   Name: Cory Pacheco MRN: [id number] DOB: August 03, 1946    ADMISSION DATE:  05/05/2017 CONSULTATION DATE:  05/05/2017  REFERRING MD:  [facility]  CHIEF COMPLAINT:  Altered mental status, seizure  HISTORY OF PRESENT ILLNESS:   71 year old male with past medical history as below, which is significant for seizures managed on Depakote, bipolar disorder, hypertension. The patient is encephalopathic and therefore history is taken from chart review and the patient's wife. She is not the best historian. 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 About 5 years ago he suffered a fall where he injured his head. Imaging at that time was negative. 3 months later he experienced his first seizure. Since then he has continued to have poorly controlled seizure activity. He has already had to encounter year 2018. Most recently he suffered a seizure on May 13. He was taken to the ER where workup was negative and he was discharged home. Since that time he has experienced intermittent alterations in mental status. His wife claims that he will "talk out of his head" and be very confused. This would happen briefly and then he would return to his usual state of health. She took him to his primary psychiatrist 5/21 who instructed them to utilize the when necessary risperidone. Then overnight in the early a.m. hours of 5/22 he awoke from sleep and was speaking gibberish. It was for that reason she took him to the [facility] emergency department. While in the emergency department he suffered one grand mal seizure and required intubation.  He was given a loading dose of Keppra. Laboratory evaluation significant for bicarbonate 12.6, anion gap 34, lactic acid 12.4, Depakote level 82. He was transferred to [facility] for ICU care.  PAST MEDICAL HISTORY :  He  has a past medical  history of Arthritis; Depression; and Hypertension.  PAST SURGICAL HISTORY: He  has a past surgical history that includes Cholecystectomy; Colonoscopy; Tonsillectomy; and Colonoscopy (N/A, 08/03/2013).  No Known Allergies  No current facility-administered medications on file prior to encounter.    Current Outpatient Prescriptions on File Prior to Encounter  Medication Sig  . ALPRAZolam (XANAX) 0.25 MG tablet Take 0.25 mg by mouth 2 (two) times daily.  Marland Kitchen amLODipine (NORVASC) 2.5 MG tablet Take 1.25 mg by mouth daily.  Marland Kitchen aspirin EC 81 MG tablet Take 81 mg by mouth daily.  . Calcium Citrate (CITRACAL PO) Take 1 tablet by mouth daily.  . Cholecalciferol (VITAMIN D3) 2000 UNITS TABS Take 1 tablet by mouth daily.  . divalproex (DEPAKOTE) 125 MG DR tablet Take 125 mg by mouth at bedtime.  . folic acid (FOLVITE) 1 MG tablet Take 2 mg by mouth daily.  . Glucosamine HCl-MSM (MSM GLUCOSAMINE PO) Take 1,500 mg by mouth daily.  . hydrochlorothiazide (MICROZIDE) 12.5 MG capsule Take 12.5 mg by mouth daily.  Marland Kitchen lisinopril (PRINIVIL,ZESTRIL) 2.5 MG tablet Take 1.25 mg by mouth daily.  . potassium chloride (MICRO-K) 10 MEQ CR capsule Take 10 mEq by mouth daily.  . pravastatin (PRAVACHOL) 10 MG tablet Take 10 mg by mouth daily.  . risperiDONE (RISPERDAL) 0.25 MG tablet Take 0.125 mg by mouth 2 (two) times daily.    FAMILY HISTORY:  His has no family status information on file.    SOCIAL HISTORY: He  reports that he has never smoked. He does not have any smokeless  tobacco history on file.  REVIEW OF SYSTEMS:   Unable as patient is encephalopathic and intubated  SUBJECTIVE:  No seizure on exam at [facility]   VITAL SIGNS: BP 135/86   Pulse 75   Resp (!) 21   Ht 6\' 2"  (1.88 m)   Wt 108.8 kg (239 lb 13.8 oz)   SpO2 99%   BMI 30.80 kg/m   HEMODYNAMICS:    VENTILATOR SETTINGS: Vent Mode: PRVC FiO2 (%):  [40 %] 40 % Set Rate:  [18 bmp] 18 bmp Vt Set:  [550 mL] 550 mL PEEP:  [5 cmH20] 5  cmH20 Plateau Pressure:  [24 cmH20] 24 cmH20  INTAKE / OUTPUT: No intake/output data recorded.  PHYSICAL EXAMINATION: General: elderly male on vent in NAD Neuro:  RASS -3.  HEENT:  [place]/AT, PERRL, no JVD Cardiovascular:  RRR, no MRG Lungs:  Clear, unlabored Abdomen:  SOft, non-tender, non-distended Musculoskeletal:  NO acute deformity Skin:  Grossly intact  LABS:  BMET No results for input(s): NA, K, CL, CO2, BUN, CREATININE, GLUCOSE in the last 168 hours.  Electrolytes No results for input(s): CALCIUM, MG, PHOS in the last 168 hours.  CBC No results for input(s): WBC, HGB, HCT, PLT in the last 168 hours.  Coag's No results for input(s): APTT, INR in the last 168 hours.  Sepsis Markers No results for input(s): LATICACIDVEN, PROCALCITON, O2SATVEN in the last 168 hours.  ABG No results for input(s): PHART, PCO2ART, PO2ART in the last 168 hours.  Liver Enzymes No results for input(s): AST, ALT, ALKPHOS, BILITOT, ALBUMIN in the last 168 hours.  Cardiac Enzymes No results for input(s): TROPONINI, PROBNP in the last 168 hours.  Glucose No results for input(s): GLUCAP in the last 168 hours.  Imaging No results found.   STUDIES:  CT head 5/22 >>> EEG 5/22 >>>  CULTURES: BCx2 5/22 >  ANTIBIOTICS:   SIGNIFICANT EVENTS:   LINES/TUBES: L fem CVL 5/22 >  DISCUSSION: 71 year old male with history of seizure managed on depakote. Presented 5/22 with one week confusion following a seizure. Had seizure again in ED. Intubated after prolonged postictal period and transferred to [facility].   ASSESSMENT / PLAN:  PULMONARY A: Need for mechanical ventilation in the setting of status epilepticus P:   Full vent support Chest x-ray ABG Ventilator associated pneumonia prevention bundle  CARDIOVASCULAR A:  History of hypertension   P:  Telemetry monitoring in ICU setting Holding home HCTZ, lisinopril  RENAL A:   Anion Gap Metabolic acidosis (lactic 12 on  admit)  P:   Repeat BMP Ensure lactic clearing Maintenance IVF NS 75/Hr  GASTROINTESTINAL A:   No acute issues  P:   NPO Protonix for SUP  HEMATOLOGIC A:   No acute issues  P:  CBC Coags  INFECTIOUS A:   No acute issues  P:   Follow cultures Monitor off ABX  ENDOCRINE A:   No acute issues   P:   Follow glucose on chemistry  NEUROLOGIC A:   Status epilepticus with history of seizure. Restarted on risperidone which can lower seizure threshold. History or Bipolar disorder and questionable schizophrenia history P:   RASS goal: 0 to -1 Keppra 500mg  BID Propofol infusion PRN versed CT head EEG Will hold home depakote. Level therapuetic at time of seizure. May need new therapy as he has been having seizures three times this year already.   FAMILY  - Updates: Wife updated bedside  - Inter-disciplinary family meet or Palliative Care meeting  due by:  5/30   Joneen Roach, AGACNP-BC [facility] Pager [phone] or [phone]  05/05/2017 8:03 AM  STAFF NOTE: Cindi Carbon, MD FACP have personally reviewed patient's available data, including medical history, events of note, physical examination and test results as part of my evaluation. I have discussed with resident/NP and other care providers such as pharmacist, RN and RRT. In addition, I personally evaluated patient and elicited key findings of: white male, no shaking, lungs clear, no distress, abdo soft, BS wnl, no r/g, trace edema, h/o reviewed and all labs from more head reviewed, presentation c/w uncontrolled seizures on therapeutic Depakote with lactic elevated from same and VDRF, needs stat abg to ensure compensation for lactic acidosis, stat pcxr, requires imaging now for etiology seizures other then ineffective meds, any fevers will LP, will consult neurology, ppi, SSI, sub q heparin once CT head done, wife updated in full, keppra load on top done and will continue  maintenance, it is concerning that Risperdal can lower seziure threshold and could have provoked, will dc, he also has a fem line we need to dc as had a stool encounter with this area, also size 6 ETT, I would like to avoid changing in a pt that has a high chance of extubation fast, will wean PS 8 when sbt The patient is critically ill with multiple organ systems failure and requires high complexity decision making for assessment and support, frequent evaluation and titration of therapies, application of advanced monitoring technologies and extensive interpretation of multiple databases.   Critical Care Time devoted to patient care services described in this note is 35 Minutes. This time reflects time of care of this signee: Rory Percy, MD FACP. This critical care time does not reflect procedure time, or teaching time or supervisory time of PA/NP/Med student/Med Resident etc but could involve care discussion time. Rest per NP/medical resident whose note is outlined above and that I agree with   Mcarthur Rossetti. Tyson Alias, MD, FACP Pgr: [phone] [facility] 05/05/2017 8:27 AM

## 2017-05-05 NOTE — Consult Note (Signed)
NEURO HOSPITALIST CONSULT NOTE   Requestig physician: Dr. Titus Mould   Reason for Consult: Possible status   History obtained from: Chart  HPI:                                                                                                                                          Cory Pacheco is an 71 y.o. male who has a seizure history and is followed by the [facility] hospital. 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 5 years ago he suffered a fall in which she started to have seizures 3 months later. He is continuing to have poorly controlled seizure activity. Most recently he did suffer a seizure which was tonic-clonic on May 13. At that time he was taken to the ED where they increased his Depakote to 1250 mg twice a day and sent home. On Monday patient was noted to suddenly have abnormal speech and talking nonsensically. The wife became very concerned and brought him to [facility] emergency department. At that point he did suffer a grand mal seizure the last for about 1 minute and then was having decreased airway which at that point he was intubated. At that hospital his Depakote level was 82. Patient was transferred to [facility] ICU. Patient has remained intubated on propofol and Versed. Recently his Versed has been turned off but he remains on propofol. Currently patient is intubated and breathing over the vent, localizing to noxious stimuli. At this point he is going down for a stat CT of head and then will obtain EEG.   Talking to wife this was atypical for his normal seizure. Usually he screams before but his time he did not. He can go three months without seizures. . Over the past year he as had 3 total seizures. There is concern Resperidal is triggering seizures. He did have Risperdal last night.   Past Medical History:  Diagnosis Date  . Arthritis   . Depression   . Hypertension     Past Surgical History:  Procedure Laterality Date  .  CHOLECYSTECTOMY    . COLONOSCOPY    . COLONOSCOPY N/A 08/03/2013   Procedure: COLONOSCOPY;  Surgeon: Rogene Houston, MD;  Location: [facility];  Service: Endoscopy;  Laterality: N/A;  930  . TONSILLECTOMY       Family History: Mother HTN Father HTN  Social History:  reports that he has never smoked. He does not have any smokeless tobacco history on file. His alcohol and drug histories are not on file.  No Known Allergies  MEDICATIONS:  Scheduled: . heparin  5,000 Units Subcutaneous Q8H  . pantoprazole (PROTONIX) IV  40 mg Intravenous QHS     ROS:                                                                                                                                       History obtained from unobtainable from patient due to Intubation     Blood pressure 135/86, pulse 75, resp. rate (!) 21, height 6\' 2"  (1.88 m), weight 108.8 kg (239 lb 13.8 oz), SpO2 99 %.   Neurologic Examination:                                                                                                      HEENT-  Normocephalic, no lesions, without obvious abnormality.  Normal external eye and conjunctiva.  Normal TM's bilaterally.  Normal auditory canals and external ears. Normal external nose, mucus membranes and septum.  Normal pharynx. Cardiovascular- S1, S2 normal, pulses palpable throughout   Lungs- chest clear, no wheezing, rales, normal symmetric air entry Abdomen- soft, non-tender; bowel sounds normal; no masses,  no organomegaly Extremities- no edema Lymph-no adenopathy palpable Musculoskeletal-no joint tenderness, deformity or swelling Skin-warm and dry, no hyperpigmentation, vitiligo, or suspicious lesions  Neurological Examination Mental Status: Currently intubated, breathing over the vent, localizes to sternal rub with bilateral arms. Cranial Nerves: II: No  blink to threat, pupils equal 1 mm, round, disconjugate but there is no eye deviation III,IV, VI: ptosis not present, extra-ocular motions intact bilaterally V,VII: symmetric, winces to noxious stimuli VIII: Unable to assess IX,X: Unable to assess XI: Unable to assess  XII: Unable to assess Motor: Localizes to pain and withdraws antigravity to noxious stimuli Sensory: As above Deep Tendon Reflexes: 2+ and symmetric throughout upper extremities with no knee jerk or ankle jerk Plantars: Upgoing bilaterally Cerebellar: Not able to assess Gait: Not able to assess      Lab Results: Basic Metabolic Panel: No results for input(s): NA, K, CL, CO2, GLUCOSE, BUN, CREATININE, CALCIUM, MG, PHOS in the last 168 hours.  Liver Function Tests: No results for input(s): AST, ALT, ALKPHOS, BILITOT, PROT, ALBUMIN in the last 168 hours. No results for input(s): LIPASE, AMYLASE in the last 168 hours. No results for input(s): AMMONIA in the last 168 hours.  CBC: No results for input(s): WBC, NEUTROABS, HGB, HCT, MCV, PLT in the last 168 hours.  Cardiac Enzymes: No results for input(s): CKTOTAL, CKMB, CKMBINDEX, TROPONINI in the last 168 hours.  Lipid Panel: No results for input(s): CHOL, TRIG, HDL, CHOLHDL, VLDL, LDLCALC in the last 168 hours.  CBG: No results for input(s): GLUCAP in the last 168 hours.  Microbiology: No results found for this or any previous visit.  Coagulation Studies: No results for input(s): LABPROT, INR in the last 72 hours.  Imaging: No results found.     Assessment and plan per attending neurologist  Etta Quill PA-C [facility] [phone]  05/05/2017, 8:31 AM   Assessment/Plan: 71 year old male presenting to [facility] after witnessed tonic-clonic seizure intubated due to concerns for airway in post-ictal state. I have concern that he may have been having a manic episode due to statesments of the wife such as his claim to speak  vietnamese, etc. I will want to rule out ongoign seizure with EEG and assess structural issues with CT. Risperdal could possibly have lowered seizure threshold some, no other obivious reason for increased frequency.   1) Stat CT 2) Stat EEG.  3) If above negative, would consider MRI brain 4) will follow.   Roland Rack, MD [facility] [phone]  If 7pm- 7am, please page neurology on call as listed in [place].

## 2017-05-05 NOTE — Progress Notes (Signed)
[place] Progress Note Patient Name: Cory Pacheco DOB: 05/27/1946 MRN: [id number]   Date of Service  05/05/2017  HPI/Events of Note  K+ = 3.4 and Creatinine = 0.86 this AM and was not replaced.   eICU Interventions  Will replace K+ and repeat BMP in AM.     Intervention Category Major Interventions: Electrolyte abnormality - evaluation and management  Amyriah Buras Eugene 05/05/2017, 8:20 PM

## 2017-05-05 NOTE — Progress Notes (Addendum)
2119- Pt seen to be having light tremor in left upper extremity sustaining for 11 min. Propofol increased to 50 mcg/kg/min. PRN dose midazolam admin.  5/23 0100- pt noted to have 30 ml/hr output over the past 6 hrs , calculated minimum output  (0.5 mL/xkg/h)  For given weight x=108.8 kg is 0.5 mL/108.8kg/h =  52mL/hr. sommer MD made aware orders to be entered by provider. 0223- pt observed having mild shaking of left upper extremity, PRN midazolam admin with prompt cessation of shaking total time observed shaking 2 min 0607- notified provided R Byrum MD of standing order to call if SYS BP <100, provider made aware of two events over night and step wise decrease of propofol in response to SYS <100 current hemodynamic status, and current Nero status. No new orders at this time.

## 2017-05-05 NOTE — Progress Notes (Signed)
EEG completed, results pending. 

## 2017-05-06 ENCOUNTER — Inpatient Hospital Stay (HOSPITAL_COMMUNITY): Payer: PPO

## 2017-05-06 DIAGNOSIS — L8990 Pressure ulcer of unspecified site, unspecified stage: Secondary | ICD-10-CM | POA: Insufficient documentation

## 2017-05-06 DIAGNOSIS — Z4659 Encounter for fitting and adjustment of other gastrointestinal appliance and device: Secondary | ICD-10-CM

## 2017-05-06 LAB — BASIC METABOLIC PANEL
Anion gap: 8 (ref 5–15)
BUN: 7 mg/dL (ref 6–20)
CO2: 22 mmol/L (ref 22–32)
Calcium: 8.5 mg/dL — ABNORMAL LOW (ref 8.9–10.3)
Chloride: 105 mmol/L (ref 101–111)
Creatinine, Ser: 0.81 mg/dL (ref 0.61–1.24)
GFR calc Af Amer: >60 mL/min (ref >60)
GFR calc non Af Amer: >60 mL/min (ref >60)
Glucose, Bld: 95 mg/dL (ref 65–99)
Potassium: 4.2 mmol/L (ref 3.5–5.1)
Sodium: 135 mmol/L (ref 135–145)

## 2017-05-06 LAB — GLUCOSE, CAPILLARY
Glucose-Capillary: 85 mg/dL (ref 65–99)
Glucose-Capillary: 93 mg/dL (ref 65–99)
Glucose-Capillary: 84 mg/dL (ref 65–99)
Glucose-Capillary: 91 mg/dL (ref 65–99)

## 2017-05-06 LAB — CBC
HCT: 39.6 % (ref 39.0–52.0)
Hemoglobin: 14 g/dL (ref 13.0–17.0)
MCH: 33.9 pg (ref 26.0–34.0)
MCHC: 35.4 g/dL (ref 30.0–36.0)
MCV: 95.9 fL (ref 78.0–100.0)
Platelets: 158 10*3/uL (ref 150–400)
RBC: 4.13 MIL/uL — ABNORMAL LOW (ref 4.22–5.81)
RDW: 12.8 % (ref 11.5–15.5)
WBC: 7.8 10*3/uL (ref 4.0–10.5)

## 2017-05-06 LAB — PHOSPHORUS
Phosphorus: 2.8 mg/dL (ref 2.5–4.6)
Phosphorus: 4 mg/dL (ref 2.5–4.6)

## 2017-05-06 LAB — MAGNESIUM
Magnesium: 1.8 mg/dL (ref 1.7–2.4)
Magnesium: 1.9 mg/dL (ref 1.7–2.4)

## 2017-05-06 MED ORDER — VITAL HIGH PROTEIN PO LIQD: 1000.0000 mL | ORAL | Status: DC

## 2017-05-06 MED ORDER — ADULT MULTIVITAMIN LIQUID CH
15.0000 mL | Freq: Every day | ORAL | Status: DC
  Administered 2017-05-09 – 2017-05-12 (×4): 15 mL
  Filled 2017-05-06 (×8): qty 15

## 2017-05-06 MED ORDER — SODIUM CHLORIDE 0.9 % IV SOLN
500.0000 mg | Freq: Once | INTRAVENOUS | Status: AC
  Administered 2017-05-06: 500 mg via INTRAVENOUS
  Filled 2017-05-06 (×2): qty 5

## 2017-05-06 MED ORDER — SODIUM CHLORIDE 0.9 % IV BOLUS (SEPSIS)
1000.0000 mL | Freq: Once | INTRAVENOUS | Status: AC
  Administered 2017-05-06: 1000 mL via INTRAVENOUS

## 2017-05-06 MED ORDER — LEVETIRACETAM 500 MG PO TABS: 500.0000 mg | ORAL_TABLET | Freq: Two times a day (BID) | ORAL | Status: DC

## 2017-05-06 MED ORDER — PRO-STAT SUGAR FREE PO LIQD: 60.0000 mL | Freq: Four times a day (QID) | ORAL | Status: DC

## 2017-05-06 MED ORDER — VITAL HIGH PROTEIN PO LIQD
1000.0000 mL | ORAL | Status: DC
  Administered 2017-05-06: 1000 mL

## 2017-05-06 MED ORDER — LORAZEPAM 2 MG/ML IJ SOLN
2.0000 mg | INTRAMUSCULAR | Status: DC | PRN
  Administered 2017-05-06: 2 mg via INTRAVENOUS
  Filled 2017-05-06: qty 1

## 2017-05-06 MED ORDER — ORAL CARE MOUTH RINSE
15.0000 mL | Freq: Two times a day (BID) | OROMUCOSAL | Status: DC
  Administered 2017-05-07 – 2017-05-11 (×10): 15 mL via OROMUCOSAL

## 2017-05-06 MED ORDER — VALPROATE SODIUM 500 MG/5ML IV SOLN
500.0000 mg | Freq: Three times a day (TID) | INTRAVENOUS | Status: DC
  Administered 2017-05-06 – 2017-05-12 (×17): 500 mg via INTRAVENOUS
  Filled 2017-05-06 (×20): qty 5

## 2017-05-06 MED ORDER — SODIUM CHLORIDE 0.9 % IV SOLN
1000.0000 mg | Freq: Two times a day (BID) | INTRAVENOUS | Status: DC
  Administered 2017-05-06 – 2017-05-09 (×6): 1000 mg via INTRAVENOUS
  Filled 2017-05-06 (×7): qty 10

## 2017-05-06 MED ORDER — PRO-STAT SUGAR FREE PO LIQD
60.0000 mL | Freq: Every day | ORAL | Status: DC
  Administered 2017-05-06: 60 mL
  Filled 2017-05-06: qty 60

## 2017-05-06 MED ORDER — PRO-STAT SUGAR FREE PO LIQD: 30.0000 mL | Freq: Two times a day (BID) | ORAL | Status: DC

## 2017-05-06 MED ORDER — CHLORHEXIDINE GLUCONATE 0.12 % MT SOLN
15.0000 mL | Freq: Two times a day (BID) | OROMUCOSAL | Status: DC
  Administered 2017-05-06 – 2017-05-09 (×5): 15 mL via OROMUCOSAL
  Filled 2017-05-06 (×6): qty 15

## 2017-05-06 NOTE — Progress Notes (Signed)
Subjective: Had 2 episodes last night concerning for seizure  Exam: Vitals:   05/06/17 0930 05/06/17 1000  BP: 138/81 (!) 147/84  Pulse: 84 95  Resp: (!) 21 20  Temp:     Gen: In bed, NAD Resp: non-labored breathing, no acute distress Abd: soft, nt  Neuro: MS: Opens eyes, follows commands CN: Pupils equal round reactive, extra movements intact he does cross midline both directions Motor: He moves all extremities against gravity Sensory: Responds to noxious stim relation 4  Pertinent Labs: BMP-unremarkable  Impression: 71 year old male with breakthrough seizures who appears to be improving. With his 2 episodes last night, I will add additional medication, but we'll perform continuous to confirm ictal nature of any further spells  Recommendations: 1) increase Keppra to a gram twice a day 2) valproic acid 500 mg 3 times a day 3) neurology will continue to follow  Roland Rack, MD [facility] [phone]  If 7pm- 7am, please page neurology on call as listed in [place].

## 2017-05-06 NOTE — Progress Notes (Signed)
vLTM EEG running. No skin breakdown. Tested event button. Notified neuro

## 2017-05-06 NOTE — Progress Notes (Signed)
[place] Progress Note Patient Name: Cory Pacheco DOB: 1946/06/17 MRN: [id number]   Date of Service  05/06/2017  HPI/Events of Note  Oliguria   eICU Interventions  Will bolus with 0.9 NaCl 1 liter IV over 1 hour now.      Intervention Category Intermediate Interventions: Oliguria - evaluation and management  Sommer,Steven Eugene 05/06/2017, 1:12 AM

## 2017-05-06 NOTE — Progress Notes (Signed)
Initial Nutrition Assessment  DOCUMENTATION CODES:   Obesity unspecified  INTERVENTION:   Vital High Protein @ 10 ml/hr (240 ml/day) 60 ml Prostat five times per day Provides: 1240 kcal, 171 grams protein, and 200 ml free water.  TF regimen and propofol at current rate providing 1794 total kcal/day   NUTRITION DIAGNOSIS:   Inadequate oral intake related to inability to eat as evidenced by NPO status.  GOAL:   Patient will meet greater than or equal to 90% of their needs  MONITOR:   TF tolerance, Vent status  REASON FOR ASSESSMENT:   Consult, Ventilator Enteral/tube feeding initiation and management  ASSESSMENT:   Pt with hx of sz d/o, bipolar d/o, HTN admitted with with confusion following a sz a week ago, had another seizure in the ED and intubated for prolonged postictal period.    Pt discussed during ICU rounds and with RN.   Patient is currently intubated on ventilator support Propofol: 21 ml/hr provides: 554 kcal per day Spoke with wife at bedside who reports no recent weight changes, good appetite PTA. No findings on physical exam.   Diet Order:  Diet NPO time specified  Skin:   (stage I sacrum)  Last BM:  unknown  Height:   Ht Readings from Last 1 Encounters:  05/05/17 6\' 2"  (1.88 m)    Weight:   Wt Readings from Last 1 Encounters:  05/06/17 245 lb 6 oz (111.3 kg)    Ideal Body Weight:  86.3 kg  BMI:  Body mass index is 31.5 kg/m.  Estimated Nutritional Needs:   Kcal:  4166-0630  Protein:  >/= 172 grams  Fluid:  2 L/day  EDUCATION NEEDS:   No education needs identified at this time  [place], [place], [place] Pager [phone] After Hours Pager

## 2017-05-06 NOTE — Progress Notes (Signed)
PULMONARY / CRITICAL CARE MEDICINE   Name: Cory Pacheco MRN: [id number] DOB: [birth year]    ADMISSION DATE:  05/05/2017 CONSULTATION DATE:  05/05/2017  REFERRING MD:  [facility] emergency department  CHIEF COMPLAINT:  Altered mental status, seizure  HISTORY OF PRESENT ILLNESS:   71 year old male with past medical history as below, which is significant for seizures managed on Depakote, bipolar disorder, hypertension. ETT needed  SUBJECTIVE:  some twitching left shoulder concerns    VITAL SIGNS: BP 116/70   Pulse 69   Temp 97.4 F (36.3 C) (Axillary)   Resp (!) 24   Ht 6\' 2"  (1.88 m)   Wt 111.3 kg (245 lb 6 oz)   SpO2 100%   BMI 31.50 kg/m   HEMODYNAMICS:    VENTILATOR SETTINGS: Vent Mode: PRVC FiO2 (%):  [30 %] 30 % Set Rate:  [24 bmp] 24 bmp Vt Set:  [550 mL] 550 mL PEEP:  [5 cmH20] 5 cmH20 Plateau Pressure:  [16 cmH20-24 cmH20] 17 cmH20  INTAKE / OUTPUT: I/O last 3 completed shifts: In: 3089.2 [I.V.:2479.2; IV Piggyback:610] Out: 1575 [Urine:1575]  PHYSICAL EXAMINATION: General: rass-1, vent Neuro: rass -1, on prop, no focuas on exam, moving ext, perrl HEENT: ett, jvd wnl PULM: CTA reduced CV: ss1 s2 RRR no r GI: soft, bs wnl no r/g Extremities:  No edema, no rash  LABS:  BMET  Recent Labs Lab 05/05/17 0950 05/06/17 0253  NA 134* 135  K 3.4* 4.2  CL 103 105  CO2 23 22  BUN 7 7  CREATININE 0.86 0.81  GLUCOSE 116* 95    Electrolytes  Recent Labs Lab 05/05/17 0950 05/06/17 0253  CALCIUM 8.5* 8.5*  MG 1.8 1.8  PHOS 2.1* 2.8    CBC  Recent Labs Lab 05/05/17 0950 05/06/17 0253  WBC 8.2 7.8  HGB 13.4 14.0  HCT 38.9* 39.6  PLT 199 158    Coag's  Recent Labs Lab 05/05/17 0950  INR 1.15    Sepsis Markers  Recent Labs Lab 05/05/17 0950  LATICACIDVEN 2.3*  PROCALCITON <0.10    ABG  Recent Labs Lab 05/05/17 0809  PHART 7.443  PCO2ART 32.9  PO2ART 180*    Liver Enzymes  Recent Labs Lab  05/05/17 0950  AST 24  ALT 17  ALKPHOS 48  BILITOT 0.8  ALBUMIN 3.2*    Cardiac Enzymes No results for input(s): TROPONINI, PROBNP in the last 168 hours.  Glucose No results for input(s): GLUCAP in the last 168 hours.  Imaging Ct Head Wo Contrast  Result Date: 05/05/2017 CLINICAL DATA:  Intermittent alteration in mental status. Confused. History seizure disorder. EXAM: CT HEAD WITHOUT CONTRAST TECHNIQUE: Contiguous axial images were obtained from the base of the skull through the vertex without intravenous contrast. COMPARISON:  02/01/2017 FINDINGS: Brain: No evidence of acute infarction, hemorrhage, extra-axial collection, ventriculomegaly, or mass effect. Generalized cerebral atrophy. Periventricular white matter low attenuation likely secondary to microangiopathy. Vascular: Cerebrovascular atherosclerotic calcifications are noted. Skull: Negative for fracture or focal lesion. Sinuses/Orbits: Visualized portions of the orbits are unremarkable. Visualized portions of the paranasal sinuses and mastoid air cells are unremarkable. Other: None. IMPRESSION: No acute intracranial pathology. Electronically Signed   By: Elige Ko   On: 05/05/2017 09:13   Dg Chest Port 1 View  Result Date: 05/05/2017 CLINICAL DATA:  71 year old male with altered mental status, intubated. EXAM: PORTABLE CHEST 1 VIEW COMPARISON:  0500 hours today and earlier. FINDINGS: Portable AP semi upright view at 1057 hours. Endotracheal tube  tip at the level the clavicles in good position. Normal cardiac size and mediastinal contours. Allowing for portable technique the lungs are clear. Improved lung volumes. Stable cholecystectomy clips. Partially visible thoracolumbar posterior spinal hardware. IMPRESSION: 1. Endotracheal tube tip in good position at the level the clavicles. 2.  No acute cardiopulmonary abnormality. Electronically Signed   By: Odessa Fleming M.D.   On: 05/05/2017 11:12     STUDIES:  CT head 5/22 >>neg acute EEG  5/22 >>>This sedated EEG is abnormal due to diffuse background slowing.  CULTURES: BCx2 5/22 >  ANTIBIOTICS:   SIGNIFICANT EVENTS: 5/22- ct, eeg  LINES/TUBES: L fem CVL 5/22 >5/23  DISCUSSION: 71 year old male with history of seizure managed on depakote. Presented 5/22 with one week confusion following a seizure. Had seizure again in ED. Intubated after prolonged postictal period and transferred to [facility].   ASSESSMENT / PLAN:  PULMONARY A: Need for mechanical ventilation in the setting of status epilepticus P:   Last abg reviewed, slight reduce MV Wean today cpap 5 ps5, goal 2 hours, with WUA pcxr in am to ensure no asp infiltrate  CARDIOVASCULAR A:  History of hypertension   P:  Tele Holding home HCTZ, lisinopril ( evaluate upper airway in future) Bp ok on own  RENAL A:   Anion Gap Metabolic acidosis (lactic 12 on admit)  P:   Chem in am  kvo fluids today  GASTROINTESTINAL A:   Remains on vent, NPO  P:   NPO Protonix for SUP Start feeds, await neuro to support extubation  HEMATOLOGIC A:   No acute issues  P:  Pt wnl scd Ensure sub q hep  INFECTIOUS A:   No asp noted Pct neg  P:   pcxr in am  Fem line was dc 5/22, told had stool on it at outside hospital  ENDOCRINE A:   No acute issues   P:   Follow glucose cbg q4h  NEUROLOGIC A:   Status epilepticus with history of seizure. Restarted on risperidone which can lower seizure threshold. History or Bipolar disorder and questionable schizophrenia history P:   RASS goal: 0 to -1 Keppra 500mg  BID, with twitching, should we increase Keep prn ativan? Propofol infusion, with wua in full to off PRN versed CT head reviewed, role MRI? EEG neg, repeat needed with twitchign or cEEG>?  FAMILY  - Updates: Wife updated bedside 5/22 updated by me  - Inter-disciplinary family meet or Palliative Care meeting due by:  5/30  Ccm time 30 min   Mcarthur Rossetti. Tyson Alias, MD, FACP Pgr:  [phone] [facility] 05/06/2017 8:51 AM

## 2017-05-06 NOTE — Progress Notes (Signed)
PCCM Progress Note.  Call to assess pt with air leak from ETT.  He was intubated for airway protection in setting of seizure.  CXR clear.  Had good inhaled tidal volume with pressure support.  BP 118/69   Pulse 78   Temp 99.1 F (37.3 C) (Axillary)   Resp 18   Ht 6\' 2"  (1.88 m)   Wt 245 lb 6 oz (111.3 kg)   SpO2 100%   BMI 31.50 kg/m   Follows simple commands.  HR regular.  No wheeze.  Abd soft.  Moves extremities.  CMP Latest Ref Rng & Units 05/06/2017 05/05/2017  Glucose 65 - 99 mg/dL 95 116(H)  BUN 6 - 20 mg/dL 7 7  Creatinine 0.61 - 1.24 mg/dL 0.81 0.86  Sodium 135 - 145 mmol/L 135 134(L)  Potassium 3.5 - 5.1 mmol/L 4.2 3.4(L)  Chloride 101 - 111 mmol/L 105 103  CO2 22 - 32 mmol/L 22 23  Calcium 8.9 - 10.3 mg/dL 8.5(L) 8.5(L)  Total Protein 6.5 - 8.1 g/dL - 5.9(L)  Total Bilirubin 0.3 - 1.2 mg/dL - 0.8  Alkaline Phos 38 - 126 U/L - 48  AST 15 - 41 U/L - 24  ALT 17 - 63 U/L - 17   CBC Latest Ref Rng & Units 05/06/2017 05/05/2017  WBC 4.0 - 10.5 K/uL 7.8 8.2  Hemoglobin 13.0 - 17.0 g/dL 14.0 13.4  Hematocrit 39.0 - 52.0 % 39.6 38.9(L)  Platelets 150 - 400 K/uL 158 199   ABG    Component Value Date/Time   PHART 7.443 05/05/2017 0809   PCO2ART 32.9 05/05/2017 0809   PO2ART 180 (H) 05/05/2017 0809   HCO3 22.1 05/05/2017 0809   ACIDBASEDEF 1.4 05/05/2017 0809   O2SAT 99.3 05/05/2017 0809   Ct Head Wo Contrast  Result Date: 05/05/2017 CLINICAL DATA:  Intermittent alteration in mental status. Confused. History seizure disorder. EXAM: CT HEAD WITHOUT CONTRAST TECHNIQUE: Contiguous axial images were obtained from the base of the skull through the vertex without intravenous contrast. COMPARISON:  02/01/2017 FINDINGS: Brain: No evidence of acute infarction, hemorrhage, extra-axial collection, ventriculomegaly, or mass effect. Generalized cerebral atrophy. Periventricular white matter low attenuation likely secondary to microangiopathy. Vascular: Cerebrovascular atherosclerotic  calcifications are noted. Skull: Negative for fracture or focal lesion. Sinuses/Orbits: Visualized portions of the orbits are unremarkable. Visualized portions of the paranasal sinuses and mastoid air cells are unremarkable. Other: None. IMPRESSION: No acute intracranial pathology. Electronically Signed   By: Kathreen Devoid   On: 05/05/2017 09:13   Dg Chest Port 1 View  Result Date: 05/05/2017 CLINICAL DATA:  71 year old male with altered mental status, intubated. EXAM: PORTABLE CHEST 1 VIEW COMPARISON:  0500 hours today and earlier. FINDINGS: Portable AP semi upright view at 1057 hours. Endotracheal tube tip at the level the clavicles in good position. Normal cardiac size and mediastinal contours. Allowing for portable technique the lungs are clear. Improved lung volumes. Stable cholecystectomy clips. Partially visible thoracolumbar posterior spinal hardware. IMPRESSION: 1. Endotracheal tube tip in good position at the level the clavicles. 2.  No acute cardiopulmonary abnormality. Electronically Signed   By: Genevie Ann M.D.   On: 05/05/2017 11:12   Dg Abd Portable 1v  Result Date: 05/06/2017 CLINICAL DATA:  OG tube placement. EXAM: PORTABLE ABDOMEN - 1 VIEW COMPARISON:  None. FINDINGS: OG tube tip is in the region of the distal stomach. Bowel gas pattern is normal. IMPRESSION: OG tube tip in the region of the distal stomach. Electronically Signed   By: Jeneen Rinks  Maxwell M.D.   On: 05/06/2017 13:13    Assessment/plan:  Compromised airway in setting of seizure. - air leak from ETT - meets criteria for extubation - will proceed with extubation - noted to be difficult airway at outside hospital >> pt's wife reports issue was related to immobility of neck  Chesley Mires, MD [place] 05/06/2017, 4:52 PM Pager:  [phone] After 3pm call: [phone]

## 2017-05-06 NOTE — Progress Notes (Signed)
Vent alarming almost constantly. Unable to reach RT. E Link called to camera in to help me troubleshoot. Pt in no distress, but not returning volumes. Dr Halford Chessman to room. Propofol stopped in hopes of extubating. If not, may exchange ETT.

## 2017-05-07 ENCOUNTER — Encounter (HOSPITAL_COMMUNITY): Payer: Self-pay

## 2017-05-07 ENCOUNTER — Inpatient Hospital Stay (HOSPITAL_COMMUNITY): Payer: PPO

## 2017-05-07 LAB — BASIC METABOLIC PANEL
Anion gap: 8 (ref 5–15)
BUN: <5 mg/dL — ABNORMAL LOW (ref 6–20)
CO2: 26 mmol/L (ref 22–32)
Calcium: 9.1 mg/dL (ref 8.9–10.3)
Chloride: 106 mmol/L (ref 101–111)
Creatinine, Ser: 0.81 mg/dL (ref 0.61–1.24)
GFR calc Af Amer: >60 mL/min (ref >60)
GFR calc non Af Amer: >60 mL/min (ref >60)
Glucose, Bld: 99 mg/dL (ref 65–99)
Potassium: 3.5 mmol/L (ref 3.5–5.1)
Sodium: 140 mmol/L (ref 135–145)

## 2017-05-07 LAB — GLUCOSE, CAPILLARY
Glucose-Capillary: 85 mg/dL (ref 65–99)
Glucose-Capillary: 88 mg/dL (ref 65–99)
Glucose-Capillary: 87 mg/dL (ref 65–99)
Glucose-Capillary: 91 mg/dL (ref 65–99)
Glucose-Capillary: 80 mg/dL (ref 65–99)

## 2017-05-07 LAB — PHOSPHORUS
Phosphorus: 3.9 mg/dL (ref 2.5–4.6)
Phosphorus: 3.1 mg/dL (ref 2.5–4.6)

## 2017-05-07 LAB — MAGNESIUM
Magnesium: 1.9 mg/dL (ref 1.7–2.4)
Magnesium: 2 mg/dL (ref 1.7–2.4)

## 2017-05-07 NOTE — Progress Notes (Signed)
Subjective: Extubated, no further episodes concerning for seizure  Exam: Vitals:   05/07/17 1300 05/07/17 1400  BP: (!) 149/86 (!) 148/95  Pulse: 88 86  Resp: 18 16  Temp:     Gen: In bed, NAD Resp: non-labored breathing, no acute distress Abd: soft, nt  Neuro: MS: Opens eyes, follows commands, he is able to answer questions, but is not oriented CN: Pupils equal round reactive, extra movements intact, face symmetric Motor: He displays good strength in all 4 extremities Sensory: Endorses symmetric sensation to light touch bilaterally  Pertinent Labs: BMP-unremarkable  Impression: 71 year old male with breakthrough seizures. He appears to be delirious at this point, but is clearly improving.  Recommendations: 1) Keppra 1 g twice a day 2) valproic acid 500 mg 3 times a day 3) neurology will continue to follow  Roland Rack, MD [facility] [phone]  If 7pm- 7am, please page neurology on call as listed in [place].

## 2017-05-07 NOTE — Procedures (Signed)
Continuous Video-EEG Monitoring Report  Patient: Cory Pacheco, [place]     EEG No.ID: [id number]     DOB: 1946/04/30 Age: 71 Room#4N21  MED REC NO: [id number] Gender: Male   TECH: [place]     Physician: Winfield Cunas     Referring Physician: Roland Rack     Report Date: 05/07/2017      Study Duration: 05/06/2017 11:56 to 05/07/2017 08:48 CPT Code:  25366 Diagnosis:  Seizure (R56.9); Altered mental status (R41.82)  History: This is a 71 year old male with a reported history of epilepsy presenting with breakthrough seizures and altered mental status.  Video-EEG monitoring was performed to evaluate for seizures.  Technical Details:  Long-term video-EEG monitoring was performed using standard setting per the guidelines.  Briefly, a minimum of 21 electrodes were placed on scalp according to the International 10-20 or/and 10-10 Systems.  Supplemental electrodes were placed as needed.  Single EKG electrode was also used to detect cardiac arrhythmia.  Patient's behavior was continuously recorded on video simultaneously with EEG.  A minimum of 16 channels were used for data display.  Each epoch of study was reviewed manually daily and as needed using standard referential and bipolar montages.    EEG Description:  There was generalized polymorphic delta slowing.  Diffuse beta activity was present, which was most likely due to medication effect (such as benzodiazepine and propfol).  No posterior dominant rhythm or sleep architecture was seen.  No epileptiform discharges or seizures were present.  Impression:  This is an abnormal EEG due to the presence of generalized polymorphic delta slowing, suggesting severe encephalopathy, but medication effect (such as benzodiazepine and propofol) could not be excluded.    Reading Physician: Winfield Cunas, MD, PhD

## 2017-05-07 NOTE — Progress Notes (Signed)
Initial Nutrition Assessment  DOCUMENTATION CODES:   Obesity unspecified  INTERVENTION:   Magic cup TID with meals, each supplement provides 290 kcal and 9 grams of protein  NUTRITION DIAGNOSIS:   Inadequate oral intake related to dysphagia (limited cognition) as evidenced by  (per RN report). Ongoing.   GOAL:   Patient will meet greater than or equal to 90% of their needs Progressing.   MONITOR:   PO intake, Supplement acceptance, Diet advancement, Skin  ASSESSMENT:   Pt with hx of sz d/o, bipolar d/o, HTN admitted with with confusion following a sz a week ago, had another seizure in the ED and intubated for prolonged postictal period.    Pt discussed during ICU rounds and with RN.   Pt extubated yesterday.  SLP eval today reports that pt with dysphagia and has decreased mentation which limits his PO intake.   Diet Order:  DIET DYS 2 Room service appropriate? Yes; Fluid consistency: Nectar Thick  Skin:   (stage I sacrum)  Last BM:  5/23  Height:   Ht Readings from Last 1 Encounters:  05/05/17 6\' 2"  (1.88 m)    Weight:   Wt Readings from Last 1 Encounters:  05/07/17 253 lb 8.5 oz (115 kg)    Ideal Body Weight:  86.3 kg  BMI:  Body mass index is 32.55 kg/m.  Estimated Nutritional Needs:   Kcal:  1900-2100  Protein:  100-115 grams  Fluid:  2 L/day  EDUCATION NEEDS:   No education needs identified at this time  [place], [place], [place] Pager [phone] After Hours Pager

## 2017-05-07 NOTE — Evaluation (Signed)
Clinical/Bedside Swallow Evaluation Patient Details  Name: Cory Pacheco MRN: [id number] Date of Birth: December 05, 1946  Today's Date: 05/07/2017 Time: SLP Start Time (ACUTE ONLY): 1448 SLP Stop Time (ACUTE ONLY): 1510 SLP Time Calculation (min) (ACUTE ONLY): 22 min  Past Medical History:  Past Medical History:  Diagnosis Date  . Arthritis   . Depression   . Hypertension    Past Surgical History:  Past Surgical History:  Procedure Laterality Date  . CHOLECYSTECTOMY    . COLONOSCOPY    . COLONOSCOPY N/A 08/03/2013   Procedure: COLONOSCOPY;  Surgeon: Malissa Hippo, MD;  Location: [facility];  Service: Endoscopy;  Laterality: N/A;  930  . TONSILLECTOMY     HPI:  Cory Bessler Crownoveris an 71 y.o.malewho has a seizure history and is followed by the [facility] hospital. 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 5 years ago he suffered a fall in which she started to have seizures 3 months later. He is continuing to have poorly controlled seizure activity. e did suffer a grand mal seizure the last for about 1 minute and then was having decreased airway which at that point he was intubated, 05/05/17 with extubation 05/06/17. Chest x ray without active disease.     Assessment / Plan / Recommendation Clinical Impression  Pt presents with a moderate oropharyngeal dysphagia of multifactorial etiology which is exacerbated by recent intubation and decreased mentation. Pt inconsistently able to follow simple commands. Vocal quality noted to be hoarse and weak with extubation noted 5/23. Pt with delayed AP transit and decreased bolus cohesion of solid PO. Thin liquids trials by teaspoon resulted in intermittent immediate & delayed coughing, concerning for reduced airway protection. Trials of nectar thick liquids without overt s/sx of aspiration. Recommend conservative dysphagia 2 (chopped) and nectar thick liquid diet with full supervision with all PO. Spouse at bedside for swallow education and diet  recommendations. ST to continue to monitor for upgraded PO trials and diet tolerance.   SLP Visit Diagnosis: Dysphagia, oropharyngeal phase (R13.12)    Aspiration Risk  Moderate aspiration risk    Diet Recommendation   Dysphagia 2 (chopped) nectar thick liquids   Medication Administration: Whole meds with puree    Other  Recommendations Oral Care Recommendations: Oral care BID Other Recommendations: Order thickener from pharmacy   Follow up Recommendations 24 hour supervision/assistance      Frequency and Duration min 2x/week  1 week       Prognosis Prognosis for Safe Diet Advancement: Fair Barriers to Reach Goals: Time post onset;Cognitive deficits      Swallow Study   General Date of Onset: 05/05/17 HPI: Cory Honorato Crownoveris an 71 y.o.malewho has a seizure history and is followed by the [facility] hospital. 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 5 years ago he suffered a fall in which she started to have seizures 3 months later. He is continuing to have poorly controlled seizure activity. e did suffer a grand mal seizure the last for about 1 minute and then was having decreased airway which at that point he was intubated, 05/05/17 with extubation 05/06/17. Chest x ray without active disease.   Type of Study: Bedside Swallow Evaluation Previous Swallow Assessment: none on file  Diet Prior to this Study: NPO Temperature Spikes Noted: No Respiratory Status: Nasal cannula History of Recent Intubation: Yes Length of Intubations (days): 1 days Date extubated: 05/06/17 Behavior/Cognition: Requires cueing;Distractible;Lethargic/Drowsy Oral Cavity Assessment: Within Functional Limits Oral Care Completed by SLP: No Oral Cavity - Dentition: Adequate  natural dentition Self-Feeding Abilities: Needs assist Patient Positioning: Upright in bed Baseline Vocal Quality: Low vocal intensity (hoarse) Volitional Cough: Strong Volitional Swallow: Able to elicit    Oral/Motor/Sensory  Function Overall Oral Motor/Sensory Function: Generalized oral weakness   Ice Chips Ice chips: Impaired Presentation: Spoon Oral Phase Impairments: Reduced lingual movement/coordination Oral Phase Functional Implications: Prolonged oral transit Pharyngeal Phase Impairments: Suspected delayed Swallow;Multiple swallows;Throat Clearing - Delayed   Thin Liquid Thin Liquid: Impaired Presentation: Spoon Oral Phase Impairments: Reduced lingual movement/coordination Oral Phase Functional Implications: Prolonged oral transit Pharyngeal  Phase Impairments: Suspected delayed Swallow;Multiple swallows;Cough - Immediate;Cough - Delayed;Throat Clearing - Delayed    Nectar Thick Nectar Thick Liquid: Within functional limits   Honey Thick Honey Thick Liquid: Not tested   Puree Puree: Within functional limits   Solid   GO   Solid: Impaired Presentation: Spoon Oral Phase Impairments: Impaired mastication;Reduced lingual movement/coordination Oral Phase Functional Implications: Impaired mastication;Prolonged oral transit Pharyngeal Phase Impairments: Multiple swallows;Suspected delayed Swallow        Marcene Duos MA, CCC-SLP Acute Care Speech Language Pathologist    Kennieth Rad 05/07/2017,3:25 PM

## 2017-05-07 NOTE — Progress Notes (Signed)
vLTM EEG complete. Results pending. No skin breakdown

## 2017-05-07 NOTE — Progress Notes (Signed)
PULMONARY / CRITICAL CARE MEDICINE   Name: Cory Pacheco MRN: [id number] DOB: [birth year]    ADMISSION DATE:  05/05/2017 CONSULTATION DATE:  05/05/2017  REFERRING MD:  [facility]  CHIEF COMPLAINT:  Altered mental status, seizure  HISTORY OF PRESENT ILLNESS:   71 year old male with past medical history as below, which is significant for seizures managed on Depakote, bipolar disorder, hypertension. ETT needed  SUBJECTIVE:  Air leak, extubated ceeg on going No focus overnight Neg 3.3 liters  VITAL SIGNS: BP (!) 144/80   Pulse 90   Temp 98 F (36.7 C) (Oral)   Resp (!) 22   Ht 6\' 2"  (1.88 m)   Wt 115 kg (253 lb 8.5 oz)   SpO2 98%   BMI 32.55 kg/m   HEMODYNAMICS:    VENTILATOR SETTINGS: Vent Mode: PRVC FiO2 (%):  [30 %] 30 % Set Rate:  [18 bmp-24 bmp] 18 bmp Vt Set:  [550 mL] 550 mL PEEP:  [5 cmH20] 5 cmH20 Plateau Pressure:  [17 cmH20-18 cmH20] 18 cmH20  INTAKE / OUTPUT: I/O last 3 completed shifts: In: 2488.2 [I.V.:1773.2; IV Piggyback:715] Out: 7253 [GUYQI:3474; Stool:1]  PHYSICAL EXAMINATION: General: no distress, no ETT Neuro: awakens, O x1, moves all ext HEENT: no ET, no stridor PULM: CTA CV:  s1 s2 RRT GI: soft, slight obese, NT,. ND,. nor Extremities: no edema   LABS:  BMET  Recent Labs Lab 05/05/17 0950 05/06/17 0253 05/07/17 0430  NA 134* 135 140  K 3.4* 4.2 3.5  CL 103 105 106  CO2 23 22 26   BUN 7 7 <5*  CREATININE 0.86 0.81 0.81  GLUCOSE 116* 95 99    Electrolytes  Recent Labs Lab 05/05/17 0950 05/06/17 0253 05/06/17 1647 05/07/17 0430  CALCIUM 8.5* 8.5*  --  9.1  MG 1.8 1.8 1.9 1.9  PHOS 2.1* 2.8 4.0 3.9    CBC  Recent Labs Lab 05/05/17 0950 05/06/17 0253  WBC 8.2 7.8  HGB 13.4 14.0  HCT 38.9* 39.6  PLT 199 158    Coag's  Recent Labs Lab 05/05/17 0950  INR 1.15    Sepsis Markers  Recent Labs Lab 05/05/17 0950  LATICACIDVEN 2.3*  PROCALCITON <0.10    ABG  Recent  Labs Lab 05/05/17 0809  PHART 7.443  PCO2ART 32.9  PO2ART 180*    Liver Enzymes  Recent Labs Lab 05/05/17 0950  AST 24  ALT 17  ALKPHOS 48  BILITOT 0.8  ALBUMIN 3.2*    Cardiac Enzymes No results for input(s): TROPONINI, PROBNP in the last 168 hours.  Glucose  Recent Labs Lab 05/06/17 1147 05/06/17 1613 05/06/17 1946 05/06/17 2313 05/07/17 0329 05/07/17 0720  GLUCAP 85 93 84 91 85 88    Imaging Dg Chest Port 1 View  Result Date: 05/07/2017 CLINICAL DATA:  Acute respiratory failure EXAM: PORTABLE CHEST 1 VIEW COMPARISON:  05/05/2017 FINDINGS: Cardiac shadow is stable. Aortic calcifications are seen. Postoperative changes are noted. The lungs are well aerated bilaterally. No focal infiltrate is noted. No bony abnormality is seen. IMPRESSION: No active disease. Electronically Signed   By: Alcide Clever M.D.   On: 05/07/2017 07:50   Dg Abd Portable 1v  Result Date: 05/06/2017 CLINICAL DATA:  OG tube placement. EXAM: PORTABLE ABDOMEN - 1 VIEW COMPARISON:  None. FINDINGS: OG tube tip is in the region of the distal stomach. Bowel gas pattern is normal. IMPRESSION: OG tube tip in the region of the distal stomach. Electronically Signed   By: Fayrene Fearing  Maxwell M.D.   On: 05/06/2017 13:13     STUDIES:  CT head 5/22 >>neg acute EEG 5/22 >>>This sedated EEG is abnormal due to diffuse background slowing.  CULTURES: BCx2 5/22 >  ANTIBIOTICS:   SIGNIFICANT EVENTS: 5/22- ct, eeg 5/23 - cuff leak, extubated, cEEG  LINES/TUBES: L fem CVL 5/22 >5/23  DISCUSSION: 71 year old male with history of seizure managed on depakote. Presented 5/22 with one week confusion following a seizure. Had seizure again in ED. Intubated after prolonged postictal period and transferred to [facility]   ASSESSMENT / PLAN:  PULMONARY A: Need for mechanical ventilation in the setting of status epilepticus P:   IS as able upright  CARDIOVASCULAR A:  History of hypertension   P:   Tele Holding home HCTZ still  RENAL A:   Anion Gap Metabolic acidosis (lactic 12 on admit) reoslved Had neg balance 3 liters P:   Chem in am  kvo fluids today Need to observe output further, may need UA, urine na, osm Na remains WNL  GASTROINTESTINAL A:   R/o dyspahgia  P:   NPO Protonix for SUP Need SLP  HEMATOLOGIC A:   No acute issues  P:  Pt wnl scd Ensure sub q hep  INFECTIOUS A:   No asp noted Pct neg  P:   Monitor temp curve  ENDOCRINE A:   Glu WNL   P:   Follow glucose cbg q4h  NEUROLOGIC A:   Status epilepticus with history of seizure. Restarted on risperidone which can lower seizure threshold. History or Bipolar disorder and questionable schizophrenia history P:   RASS goal: 0 to -1 Keppra per neuro PRN versed - would limit this roel MRi? cEEG on going  FAMILY  - Updates: Wife updated bedside 5/22, 5.24 updated by me  - Inter-disciplinary family meet or Palliative Care meeting due by:  5/30  Ccm time 30 min   Mcarthur Rossetti. Tyson Alias, MD, FACP Pgr: [phone] [facility] 05/07/2017 8:20 AM

## 2017-05-08 ENCOUNTER — Encounter (HOSPITAL_COMMUNITY): Payer: Self-pay

## 2017-05-08 ENCOUNTER — Inpatient Hospital Stay (HOSPITAL_COMMUNITY): Payer: PPO

## 2017-05-08 DIAGNOSIS — I10 Essential (primary) hypertension: Secondary | ICD-10-CM

## 2017-05-08 DIAGNOSIS — R569 Unspecified convulsions: Secondary | ICD-10-CM

## 2017-05-08 LAB — BASIC METABOLIC PANEL
Anion gap: 7 (ref 5–15)
BUN: 10 mg/dL (ref 6–20)
CO2: 27 mmol/L (ref 22–32)
Calcium: 9.2 mg/dL (ref 8.9–10.3)
Chloride: 104 mmol/L (ref 101–111)
Creatinine, Ser: 0.79 mg/dL (ref 0.61–1.24)
GFR calc Af Amer: >60 mL/min (ref >60)
GFR calc non Af Amer: >60 mL/min (ref >60)
Glucose, Bld: 79 mg/dL (ref 65–99)
Potassium: 3.4 mmol/L — ABNORMAL LOW (ref 3.5–5.1)
Sodium: 138 mmol/L (ref 135–145)

## 2017-05-08 LAB — GLUCOSE, CAPILLARY
Glucose-Capillary: 88 mg/dL (ref 65–99)
Glucose-Capillary: 88 mg/dL (ref 65–99)

## 2017-05-08 MED ORDER — RESOURCE THICKENUP CLEAR PO POWD
ORAL | Status: DC | PRN
  Filled 2017-05-08: qty 125

## 2017-05-08 MED ORDER — POTASSIUM CHLORIDE 10 MEQ/100ML IV SOLN
10.0000 meq | INTRAVENOUS | Status: AC
  Administered 2017-05-08 (×4): 10 meq via INTRAVENOUS
  Filled 2017-05-08 (×4): qty 100

## 2017-05-08 MED ORDER — ALPRAZOLAM 0.25 MG PO TABS: 0.2500 mg | ORAL_TABLET | Freq: Two times a day (BID) | ORAL | Status: DC | PRN

## 2017-05-08 MED ORDER — AMLODIPINE BESYLATE 2.5 MG PO TABS
2.5000 mg | ORAL_TABLET | Freq: Every day | ORAL | Status: DC
  Administered 2017-05-08 – 2017-05-12 (×5): 2.5 mg via ORAL
  Filled 2017-05-08 (×5): qty 1

## 2017-05-08 NOTE — Progress Notes (Signed)
[facility] ADULT ICU REPLACEMENT PROTOCOL FOR AM LAB REPLACEMENT ONLY  The patient does not apply for the [facility] Adult ICU Electrolyte Replacment Protocol based on the criteria listed below:    Is urine output >/= 0.5 ml/kg/hr for the last 6 hours? Yes.   Patient's UOP is 0.3 ml/kg/hr  Abnormal electrolyte(s)K3.4  If a panic level lab has been reported, has the CCM MD in charge been notified? Yes.  .   Physician:  Jasper Loser, MD  [facility] 05/08/2017 6:17 AM

## 2017-05-08 NOTE — Progress Notes (Signed)
Occupational Therapy Evaluation Patient Details Name: Cory Pacheco MRN: [id number] DOB: July 18, 1946 Today's Date: 05/08/2017    History of Present Illness 71 year old male with history of seizure managed on depakote. Presented 5/22 with one week confusion following a seizure. Had seizure again in ED. Intubated after prolonged postictal period and transferred to [facility], seen by neurology, seizure has been controlled, successfully extubated. Encephalopathy.    Clinical Impression   PTA, pt lived at ome with wife and was independent with ADL and mobility. Pt currently requires min A for mobility and Mod A for ADL due to below deficits. Feel pt would benefit from rehab at SNF to facilitate return to PLOF. Will follow acutely to maximize functional level of independence and facilitate DC to next venue of care.     Follow Up Recommendations  SNF;Supervision/Assistance - 24 hour    Equipment Recommendations  None recommended by OT    Recommendations for Other Services       Precautions / Restrictions Precautions Precautions: Fall      Mobility Bed Mobility Overal bed mobility: Needs Assistance Bed Mobility: Supine to Sit     Supine to sit: Min assist        Transfers Overall transfer level: Needs assistance   Transfers: Sit to/from Stand;Stand Pivot Transfers Sit to Stand: Min assist Stand pivot transfers: Min assist;+2 physical assistance       General transfer comment: several LOB during mobility - primarily posteriorly    Balance Overall balance assessment: Needs assistance   Sitting balance-Leahy Scale: Fair Sitting balance - Comments: r ibas in chair     Standing balance-Leahy Scale: Poor                             ADL either performed or assessed with clinical judgement   ADL Overall ADL's : Needs assistance/impaired Eating/Feeding: Minimal assistance;Cueing for safety;Cueing for sequencing;Cueing for compensatory  techinques Eating/Feeding Details (indicate cue type and reason): Attempted to educaqte wife on safe feeding tachniques. Educated wife on need to have pt sitting upright and alert before she attempts to assist him with meals. On entry, pt reclined @ 60 degrees HOB with wife pouring thickened liquid into his mouth. Wife appears to need further education on this. Grooming: Standing;Minimal assistance Grooming Details (indicate cue type and reason): mod vc for sequening and task completion Upper Body Bathing: Moderate assistance   Lower Body Bathing: Moderate assistance;Sit to/from stand   Upper Body Dressing : Moderate assistance;Sitting   Lower Body Dressing: Moderate assistance;Sit to/from stand   Toilet Transfer: Minimal assistance;+2 for safety/equipment Toilet Transfer Details (indicate cue type and reason): simulated         Functional mobility during ADLs: +2 for safety/equipment;Minimal assistance;Cueing for safety;Cueing for sequencing General ADL Comments: Pt easily distracted/poor attention to complete tasks adn cues for seqeusing of tasks. Educated wife on need to minimize distraction during functional tasks.     Vision Baseline Vision/History: Wears glasses Additional Comments: does not have glasses at this time     Perception Perception Comments: will further assess   Praxis Praxis Praxis tested?: Deficits Deficits: Initiation;Organization    Pertinent Vitals/Pain Pain Assessment: Faces Faces Pain Scale: Hurts little more Pain Location: L hand with use Pain Descriptors / Indicators: Grimacing Pain Intervention(s): Limited activity within patient's tolerance     Hand Dominance Left   Extremity/Trunk Assessment Upper Extremity Assessment Upper Extremity Assessment: Generalized weakness   Lower Extremity Assessment  Lower Extremity Assessment: Defer to PT evaluation   Cervical / Trunk Assessment Cervical / Trunk Assessment: Kyphotic   Communication  Communication Communication: No difficulties   Cognition Arousal/Alertness: Awake/alert Behavior During Therapy: Flat affect Overall Cognitive Status: Impaired/Different from baseline Area of Impairment: Orientation;Attention;Memory;Following commands;Safety/judgement;Awareness;Problem solving                 Orientation Level: Disoriented to;Place;Time Current Attention Level: Sustained Memory: Decreased recall of precautions;Decreased short-term memory Following Commands: Follows one step commands with increased time Safety/Judgement: Decreased awareness of safety;Decreased awareness of deficits Awareness: Emergent Problem Solving: Slow processing;Decreased initiation;Difficulty sequencing;Requires verbal cues     General Comments       Exercises     Shoulder Instructions      Home Living Family/patient expects to be discharged to:: Private residence Living Arrangements: Spouse/significant other Available Help at Discharge: Family;Available PRN/intermittently (wife works part time) Type of Home: House Home Access: Stairs to enter Secretary/administrator of Steps: 2 Entrance Stairs-Rails: Right;Left Home Layout: One level     Bathroom Shower/Tub: Producer, television/film/video: Handicapped height Bathroom Accessibility: Yes How Accessible: Accessible via walker Home Equipment: Walker - 2 wheels;Bedside commode;Walker - standard;Cane - single point          Prior Functioning/Environment Level of Independence: Independent                 OT Problem List: Decreased strength;Decreased activity tolerance;Impaired balance (sitting and/or standing);Decreased coordination;Decreased cognition;Decreased safety awareness;Decreased knowledge of use of DME or AE      OT Treatment/Interventions: Self-care/ADL training;Therapeutic exercise;DME and/or AE instruction;Therapeutic activities;Cognitive remediation/compensation;Visual/perceptual  remediation/compensation;Patient/family education;Balance training    OT Goals(Current goals can be found in the care plan section) Acute Rehab OT Goals Patient Stated Goal: to get better OT Goal Formulation: With patient/family Time For Goal Achievement: 05/22/17 Potential to Achieve Goals: Good ADL Goals Pt Will Perform Upper Body Bathing: with supervision;with set-up;sitting Pt Will Perform Lower Body Bathing: with supervision;with set-up;sit to/from stand Pt Will Transfer to Toilet: with supervision;ambulating;bedside commode Pt Will Perform Toileting - Clothing Manipulation and hygiene: with supervision;sit to/from stand Additional ADL Goal #1: Pt will demonstrate anticipatory awareness during ADL task in nondistractive environment  OT Frequency: Min 2X/week   Barriers to D/C:            Co-evaluation PT/OT/SLP Co-Evaluation/Treatment: Yes Reason for Co-Treatment: Necessary to address cognition/behavior during functional activity;For patient/therapist safety   OT goals addressed during session: ADL's and self-care      AM-PAC PT "6 Clicks" Daily Activity     Outcome Measure Help from another person eating meals?: A Lot Help from another person taking care of personal grooming?: A Lot Help from another person toileting, which includes using toliet, bedpan, or urinal?: A Lot Help from another person bathing (including washing, rinsing, drying)?: A Lot Help from another person to put on and taking off regular upper body clothing?: A Lot Help from another person to put on and taking off regular lower body clothing?: A Lot 6 Click Score: 12   End of Session Equipment Utilized During Treatment: Gait belt Nurse Communication: Mobility status  Activity Tolerance: Patient tolerated treatment well Patient left: in chair;with call bell/phone within reach;with chair alarm set;with family/visitor present  OT Visit Diagnosis: Unsteadiness on feet (R26.81);Muscle weakness  (generalized) (M62.81);Cognitive communication deficit (R41.841) Symptoms and signs involving cognitive functions: Other cerebrovascular disease                Time: 1200-1226 OT Time  Calculation (min): 26 min Charges:  OT General Charges $OT Visit: 1 Procedure OT Evaluation $OT Eval Moderate Complexity: 1 Procedure G-Codes:     Cory Pacheco, OT/L  [phone] 05/08/2017  Cory Pacheco,HILLARY 05/08/2017, 1:33 PM

## 2017-05-08 NOTE — Progress Notes (Signed)
Speech Language Pathology Treatment: Dysphagia  Patient Details Name: Cory Pacheco MRN: [id number] DOB: 07-05-1946 Today's Date: 05/08/2017 Time: 1027-2536 SLP Time Calculation (min) (ACUTE ONLY): 16 min  Assessment / Plan / Recommendation Clinical Impression  Pt remains confused, needing Mod cues for awareness, sustained attention, and basic problem solving. RN reports coughing this morning when pt attempted thin liquids and when he drank nectar thick liquids from a straw. His wife shares that she has been mixing his chopped solids with the oatmeal to make it softer to reduce coughing as well. During skilled observation from SLP he does still have intermittent coughing that appears to be with larger boluses, although he does cough with his Cory Pacheco, too. Recommend to proceed with MBS to better assess oropharyngeal swallow. Until then would continue current diet with aspiration precautions and strategies listed below to maximize safety. Should he continue to cough, would consider holding POs until test is completed this afternoon.   HPI HPI: Cory Pacheco an 71 y.o.malewho has a seizure history and is followed by the [facility] hospital. 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 5 years ago he suffered a fall in which she started to have seizures 3 months later. He is continuing to have poorly controlled seizure activity. e did suffer a grand mal seizure the last for about 1 minute and then was having decreased airway which at that point he was intubated, 05/05/17 with extubation 05/06/17. Chest x ray without active disease.        SLP Plan  Continue with current plan of care       Recommendations  Diet recommendations: Dysphagia 2 (fine chop);Nectar-thick liquid Liquids provided via: Pacheco;No straw Medication Administration: Whole meds with puree Supervision: Staff to assist with self feeding;Full supervision/cueing for compensatory strategies Compensations: Minimize  environmental distractions;Slow rate;Small sips/bites Postural Changes and/or Swallow Maneuvers: Seated upright 90 degrees                Oral Care Recommendations: Oral care BID Follow up Recommendations: Home health SLP;24 hour supervision/assistance SLP Visit Diagnosis: Dysphagia, unspecified (R13.10) Plan: Continue with current plan of care       GO                Maxcine Ham 05/08/2017, 9:32 AM  Maxcine Ham, M.A. CCC-SLP [phone]

## 2017-05-08 NOTE — Progress Notes (Signed)
Subjective: Mental status is improving, though not quite at baseline  Exam: Vitals:   05/08/17 1115 05/08/17 1513  BP: 139/80 (!) 143/76  Pulse: 82 74  Resp: 20 18  Temp: 98.2 F (36.8 C) 97.9 F (36.6 C)   Gen: In bed, NAD Resp: non-labored breathing, no acute distress Abd: soft, nt  Neuro: MS: Opens eyes, follows commands, he is Oriented to hospital, year but not month CN: Pupils equal round reactive, extra movements intact, face symmetric Motor: He displays good strength in all 4 extremities Sensory: Endorses symmetric sensation to light touch bilaterally  Pertinent Labs: BMP-unremarkable  Impression: 71 year old male with breakthrough seizures who was intubated in the setting of postictal state. He is currently mildly delirious, but improving. I do wonder about some underlying neurocognitive deficit. I would expect him to continue to improve. I do think it would be reasonable to perform an MRI given that he is still mildly confused.  Recommendations: 1) Keppra 1 g twice a day 2) valproic acid 500 mg 3 times a day 3) neurology will continue to follow  Roland Rack, MD [facility] [phone]  If 7pm- 7am, please page neurology on call as listed in [place].

## 2017-05-08 NOTE — Progress Notes (Signed)
PROGRESS NOTE                                                                                                                                                                                                             Patient Demographics:    Cory Pacheco, is a 71 y.o. male, DOB - 10/29/1946, NWG:[id number]  Admit date - 05/05/2017   Admitting Physician Oretha Milch, MD  Outpatient Primary MD for the patient is Richardean Chimera, MD  LOS - 3  Brief Narrative   71 year old male with history of seizure managed on depakote. Presented 5/22 with one week confusion following a seizure. Had seizure again in ED. Intubated after prolonged postictal period and transferred to [facility], seen by neurology, seizure has been controlled, successfully extubated.   Subjective:    [name] today Denies any complaints , afebrile, no chills, no chest pain, no shortness of breath , no nausea or vomiting     Assessment  & Plan :    Active Problems:   Seizure (HCC)   Status epilepticus (HCC)   Acute respiratory failure (HCC)   Pressure injury of skin   Encounter for orogastric (OG) tube placement  Encephalopathy - Secondary to seizures/postictal seizures, not back at baseline yet, currently with delirium. - Continue with dysphagia diet, followed by SLP Seizures - Management per neurology, continuous video EEG 5/24, suggestive of severe encephalopathy, with no epileptiform discharges or seizures were present. - On Keppra and valproic acid  Respiratory failure - intubated  for airway protection, stability, back to baseline - Resolved  Hypertension - Blood pressure started to increase, will resume on amlodipine, continue to hold hydrochlorothiazide and lisinopril  Hyperlipidemia - Continue to hold statin  Code Status : Full  Family Communication  : wife at bedside  Disposition Plan  : pending PT  Consults  :  PCCM, neurology  Procedures  :  ETT, continuous EEG  DVT Prophylaxis  :   Heparin - SCDs   Lab Results  Component Value Date   PLT 158 05/06/2017    Antibiotics  :   Anti-infectives    None        Objective:   Vitals:   05/08/17 0700 05/08/17 0800 05/08/17 0900 05/08/17 1000  BP: 131/71 (!) 144/81 140/88 (!) 145/75  Pulse: 75 77 86  80  Resp: 13 16 13 18   Temp:  98.1 F (36.7 C)    TempSrc:  Oral    SpO2: 95% 95% 95% 98%  Weight:      Height:        Wt Readings from Last 3 Encounters:  05/08/17 110.7 kg (244 lb 0.8 oz)  08/03/13 97.5 kg (215 lb)     Intake/Output Summary (Last 24 hours) at 05/08/17 1058 Last data filed at 05/08/17 1045  Gross per 24 hour  Intake              780 ml  Output             1175 ml  Net             -395 ml     Physical Exam  Awake Alert, Oriented X 2, Or appropriate today per wife Symmetrical Chest wall movement, Good air movement bilaterally, CTAB RRR,No Gallops,Rubs or new Murmurs, No Parasternal Heave +ve B.Sounds, Abd Soft, No tenderness,  No rebound - guarding or rigidity. No Cyanosis, Clubbing or edema, No new Rash or bruise      Data Review:    CBC  Recent Labs Lab 05/05/17 0950 05/06/17 0253  WBC 8.2 7.8  HGB 13.4 14.0  HCT 38.9* 39.6  PLT 199 158  MCV 95.8 95.9  MCH 33.0 33.9  MCHC 34.4 35.4  RDW 13.0 12.8  LYMPHSABS 1.6  --   MONOABS 0.6  --   EOSABS 0.1  --   BASOSABS 0.0  --     Chemistries   Recent Labs Lab 05/05/17 0950 05/06/17 0253 05/06/17 1647 05/07/17 0430 05/07/17 1624 05/08/17 0345  NA 134* 135  --  140  --  138  K 3.4* 4.2  --  3.5  --  3.4*  CL 103 105  --  106  --  104  CO2 23 22  --  26  --  27  GLUCOSE 116* 95  --  99  --  79  BUN 7 7  --  <5*  --  10  CREATININE 0.86 0.81  --  0.81  --  0.79  CALCIUM 8.5* 8.5*  --  9.1  --  9.2  MG 1.8 1.8 1.9 1.9 2.0  --   AST 24  --   --   --   --   --   ALT 17  --   --   --   --   --   ALKPHOS 48  --   --   --   --   --   BILITOT 0.8  --   --   --   --   --      ------------------------------------------------------------------------------------------------------------------ No results for input(s): CHOL, HDL, LDLCALC, TRIG, CHOLHDL, LDLDIRECT in the last 72 hours.  No results found for: HGBA1C ------------------------------------------------------------------------------------------------------------------ No results for input(s): TSH, T4TOTAL, T3FREE, THYROIDAB in the last 72 hours.  Invalid input(s): FREET3 ------------------------------------------------------------------------------------------------------------------ No results for input(s): VITAMINB12, FOLATE, FERRITIN, TIBC, IRON, RETICCTPCT in the last 72 hours.  Coagulation profile  Recent Labs Lab 05/05/17 0950  INR 1.15    No results for input(s): DDIMER in the last 72 hours.  Cardiac Enzymes No results for input(s): CKMB, TROPONINI, MYOGLOBIN in the last 168 hours.  Invalid input(s): CK ------------------------------------------------------------------------------------------------------------------ No results found for: BNP  Inpatient Medications  Scheduled Meds: . chlorhexidine  15 mL Mouth Rinse BID  . heparin  5,000 Units Subcutaneous Q8H  .  mouth rinse  15 mL Mouth Rinse q12n4p  . multivitamin  15 mL Per Tube Daily  . pantoprazole (PROTONIX) IV  40 mg Intravenous QHS   Continuous Infusions: . sodium chloride 10 mL/hr at 05/08/17 0200  . levETIRAcetam 1,000 mg (05/08/17 0403)  . potassium chloride 10 mEq (05/08/17 0925)  . valproate sodium Stopped (05/08/17 0650)   PRN Meds:.acetaminophen, LORazepam  Micro Results Recent Results (from the past 240 hour(s))  MRSA PCR Screening     Status: None   Collection Time: 05/05/17  8:20 AM  Result Value Ref Range Status   MRSA by PCR NEGATIVE NEGATIVE Final    Comment:        The GeneXpert MRSA Assay (FDA approved for NASAL specimens only), is one component of a comprehensive MRSA colonization surveillance  program. It is not intended to diagnose MRSA infection nor to guide or monitor treatment for MRSA infections.   Culture, blood (routine x 2)     Status: None (Preliminary result)   Collection Time: 05/05/17 10:52 AM  Result Value Ref Range Status   Specimen Description BLOOD LEFT HAND  Final   Special Requests IN PEDIATRIC BOTTLE Blood Culture adequate volume  Final   Culture NO GROWTH 2 DAYS  Final   Report Status PENDING  Incomplete  Culture, blood (routine x 2)     Status: None (Preliminary result)   Collection Time: 05/05/17 10:52 AM  Result Value Ref Range Status   Specimen Description BLOOD RIGHT HAND  Final   Special Requests IN PEDIATRIC BOTTLE Blood Culture adequate volume  Final   Culture NO GROWTH 2 DAYS  Final   Report Status PENDING  Incomplete    Radiology Reports Ct Head Wo Contrast  Result Date: 05/05/2017 CLINICAL DATA:  Intermittent alteration in mental status. Confused. History seizure disorder. EXAM: CT HEAD WITHOUT CONTRAST TECHNIQUE: Contiguous axial images were obtained from the base of the skull through the vertex without intravenous contrast. COMPARISON:  02/01/2017 FINDINGS: Brain: No evidence of acute infarction, hemorrhage, extra-axial collection, ventriculomegaly, or mass effect. Generalized cerebral atrophy. Periventricular white matter low attenuation likely secondary to microangiopathy. Vascular: Cerebrovascular atherosclerotic calcifications are noted. Skull: Negative for fracture or focal lesion. Sinuses/Orbits: Visualized portions of the orbits are unremarkable. Visualized portions of the paranasal sinuses and mastoid air cells are unremarkable. Other: None. IMPRESSION: No acute intracranial pathology. Electronically Signed   By: Elige Ko   On: 05/05/2017 09:13   Dg Chest Port 1 View  Result Date: 05/07/2017 CLINICAL DATA:  Acute respiratory failure EXAM: PORTABLE CHEST 1 VIEW COMPARISON:  05/05/2017 FINDINGS: Cardiac shadow is stable. Aortic  calcifications are seen. Postoperative changes are noted. The lungs are well aerated bilaterally. No focal infiltrate is noted. No bony abnormality is seen. IMPRESSION: No active disease. Electronically Signed   By: Alcide Clever M.D.   On: 05/07/2017 07:50   Dg Chest Port 1 View  Result Date: 05/05/2017 CLINICAL DATA:  71 year old male with altered mental status, intubated. EXAM: PORTABLE CHEST 1 VIEW COMPARISON:  0500 hours today and earlier. FINDINGS: Portable AP semi upright view at 1057 hours. Endotracheal tube tip at the level the clavicles in good position. Normal cardiac size and mediastinal contours. Allowing for portable technique the lungs are clear. Improved lung volumes. Stable cholecystectomy clips. Partially visible thoracolumbar posterior spinal hardware. IMPRESSION: 1. Endotracheal tube tip in good position at the level the clavicles. 2.  No acute cardiopulmonary abnormality. Electronically Signed   By: Althea Grimmer.D.  On: 05/05/2017 11:12   Dg Abd Portable 1v  Result Date: 05/06/2017 CLINICAL DATA:  OG tube placement. EXAM: PORTABLE ABDOMEN - 1 VIEW COMPARISON:  None. FINDINGS: OG tube tip is in the region of the distal stomach. Bowel gas pattern is normal. IMPRESSION: OG tube tip in the region of the distal stomach. Electronically Signed   By: Francene Boyers M.D.   On: 05/06/2017 13:13     Amalie Koran M.D on 05/08/2017 at 10:58 AM  Between 7am to 7pm - Pager - [phone]  After 7pm go to [email or web address] - password [facility]  [facility] -  Office  [phone]

## 2017-05-08 NOTE — Evaluation (Signed)
Physical Therapy Evaluation Patient Details Name: Cory Pacheco MRN: [id number] DOB: April 05, 1946 Today's Date: 05/08/2017   History of Present Illness  71 year old male with history of seizure managed on depakote. Presented 5/22 with one week confusion following a seizure. Had seizure again in ED. Intubated after prolonged postictal period and transferred to [facility], seen by neurology, seizure has been controlled, successfully extubated. Encephalopathy.   Clinical Impression  Patient presents with generalized weakness, impaired balance, confusion, decreased attention, and impaired mobility s/p above. Tolerated gait training with Min A for balance/safety. Pt with several LOB during gait training posteriorly and to the right. Pt independent PTA. Would benefit from SNF to maximize independence and mobility prior to return home. Will follow acutely.    Follow Up Recommendations SNF;Supervision/Assistance - 24 hour    Equipment Recommendations  None recommended by PT    Recommendations for Other Services       Precautions / Restrictions Precautions Precautions: Fall Restrictions Weight Bearing Restrictions: No      Mobility  Bed Mobility Overal bed mobility: Needs Assistance Bed Mobility: Supine to Sit     Supine to sit: Min assist     General bed mobility comments: INcreased time and use of rail. No dizziness.  Transfers Overall transfer level: Needs assistance   Transfers: Sit to/from Stand;Stand Pivot Transfers Sit to Stand: Min assist Stand pivot transfers: Min assist;+2 physical assistance       General transfer comment: several LOB during mobility - primarily posteriorly and to the right.   Ambulation/Gait Ambulation/Gait assistance: Mod assist;Min assist Ambulation Distance (Feet): 40 Feet Assistive device: None Gait Pattern/deviations: Step-through pattern;Step-to pattern;Shuffle;Staggering right;Leaning posteriorly;Trunk flexed Gait velocity: decreased   General Gait Details: Pt with flexed posture (hx of spine surgeries), shuffling like gait with decreased stance time RLE, instability noted and LOB x3 to the right and posteriorly. Unsteady.  Stairs            Wheelchair Mobility    Modified Rankin (Stroke Patients Only) Modified Rankin (Stroke Patients Only) Pre-Morbid Rankin Score: No symptoms Modified Rankin: Moderately severe disability     Balance Overall balance assessment: Needs assistance Sitting-balance support: Feet supported;No upper extremity supported Sitting balance-Leahy Scale: Fair Sitting balance - Comments: right lean in chair   Standing balance support: During functional activity Standing balance-Leahy Scale: Poor Standing balance comment: Requires UE support in standing to prevent LOB. Able to stand at sink and wash hands but needs Min-Mod A due to posteriorly lean.                             Pertinent Vitals/Pain Pain Assessment: Faces Pain Score: 4  Faces Pain Scale: Hurts little more Pain Location: right hand Pain Descriptors / Indicators: Grimacing Pain Intervention(s): Limited activity within patient's tolerance    Home Living Family/patient expects to be discharged to:: Private residence Living Arrangements: Spouse/significant other Available Help at Discharge: Family;Available PRN/intermittently Type of Home: House Home Access: Stairs to enter Entrance Stairs-Rails: Doctor, general practice of Steps: 2 Home Layout: One level Home Equipment: Walker - 2 wheels;Bedside commode;Walker - standard;Cane - single point      Prior Function Level of Independence: Independent               Hand Dominance   Dominant Hand: Left    Extremity/Trunk Assessment   Upper Extremity Assessment Upper Extremity Assessment: Defer to OT evaluation    Lower Extremity Assessment Lower Extremity Assessment:  Generalized weakness (Possibly more functional weakness noted in RLE  due to LOB towards right and instability noted with gait.)    Cervical / Trunk Assessment Cervical / Trunk Assessment: Kyphotic  Communication   Communication: No difficulties  Cognition Arousal/Alertness: Awake/alert Behavior During Therapy: Flat affect Overall Cognitive Status: Impaired/Different from baseline Area of Impairment: Orientation;Attention;Memory;Following commands;Safety/judgement;Awareness;Problem solving                 Orientation Level: Disoriented to;Place;Time Current Attention Level: Sustained Memory: Decreased recall of precautions;Decreased short-term memory Following Commands: Follows one step commands with increased time Safety/Judgement: Decreased awareness of safety;Decreased awareness of deficits Awareness: Emergent Problem Solving: Slow processing;Decreased initiation;Difficulty sequencing;Requires verbal cues        General Comments General comments (skin integrity, edema, etc.): Wife present during session.    Exercises     Assessment/Plan    PT Assessment Patient needs continued PT services  PT Problem List Decreased strength;Decreased mobility;Decreased safety awareness;Decreased activity tolerance;Decreased balance;Decreased cognition       PT Treatment Interventions DME instruction;Therapeutic activities;Therapeutic exercise;Cognitive remediation;Gait training;Patient/family education;Balance training;Functional mobility training    PT Goals (Current goals can be found in the Care Plan section)  Acute Rehab PT Goals Patient Stated Goal: to get better PT Goal Formulation: With patient/family Time For Goal Achievement: 05/22/17 Potential to Achieve Goals: Fair    Frequency Min 2X/week   Barriers to discharge Decreased caregiver support wife works during the day part time    Co-evaluation PT/OT/SLP Co-Evaluation/Treatment: Yes Reason for Co-Treatment: Necessary to address cognition/behavior during functional activity;For  patient/therapist safety PT goals addressed during session: Mobility/safety with mobility;Balance OT goals addressed during session: ADL's and self-care       AM-PAC PT "6 Clicks" Daily Activity  Outcome Measure Difficulty turning over in bed (including adjusting bedclothes, sheets and blankets)?: None Difficulty moving from lying on back to sitting on the side of the bed? : Total Difficulty sitting down on and standing up from a chair with arms (e.g., wheelchair, bedside commode, etc,.)?: Total Help needed moving to and from a bed to chair (including a wheelchair)?: A Little Help needed walking in hospital room?: A Lot Help needed climbing 3-5 steps with a railing? : A Lot 6 Click Score: 13    End of Session Equipment Utilized During Treatment: Gait belt Activity Tolerance: Patient tolerated treatment well Patient left: in chair;with call bell/phone within reach;with family/visitor present;with chair alarm set Nurse Communication: Mobility status PT Visit Diagnosis: Unsteadiness on feet (R26.81);Other symptoms and signs involving the nervous system (R29.898);Other abnormalities of gait and mobility (R26.89);Muscle weakness (generalized) (M62.81)    Time: 0737-1062 PT Time Calculation (min) (ACUTE ONLY): 26 min   Charges:   PT Evaluation $PT Eval Moderate Complexity: 1 Procedure     PT G Codes:        Mylo Red, PT, DPT [phone]    Blake Divine A Adeline Petitfrere 05/08/2017, 2:41 PM

## 2017-05-08 NOTE — Progress Notes (Signed)
Modified Barium Swallow Progress Note  Patient Details  Name: Cory Pacheco MRN: [id number] Date of Birth: 09-03-1946  Today's Date: 05/08/2017  Modified Barium Swallow completed.  Full report located under Chart Review in the Imaging Section.  Brief recommendations include the following:  Clinical Impression  Pt has a moderate oropharyngeal dysphagia, with oral phase largely impacted by mentation. He has reduced bolus cohesion and slow transit of all consistencies, and he consumes liquids at a rapid rate of intake despite cues from SLP to attempt smaller, single sips. Barium spills into the pharynx with thickened liquids pooling all the way in the pyriform sinuses in large volumes before he initiates a swallow. Pt has reduced hyolaryngeal movement, epiglottic deflection, and airway closure, causing deep, silent penetration and intermittent aspiration with nectar thick liquids. His cued cough is not effective at clearing laryngeal penetrates. He has similar penetration with soft solid boluses, but when he consumes purees and honey thick liquids he has only intermittent, shallow penetration that he can more easily clear. Pt has moderate-severe vallecular residue post-swallow, but this is reduced with cues for a second swallow. Recommend downgrade to Dys 1 diet and honey thick liquids with use of a second swallow and full supervision to monitor for small, single boluses.   Swallow Evaluation Recommendations       SLP Diet Recommendations: Dysphagia 1 (Puree) solids;Honey thick liquids   Liquid Administration via: Cup;No straw   Medication Administration: Crushed with puree   Supervision: Staff to assist with self feeding;Full supervision/cueing for compensatory strategies   Compensations: Minimize environmental distractions;Slow rate;Small sips/bites;Multiple dry swallows after each bite/sip   Postural Changes: Remain semi-upright after after feeds/meals (Comment);Seated upright at 90  degrees   Oral Care Recommendations: Oral care BID   Other Recommendations: Order thickener from pharmacy;Prohibited food (jello, ice cream, thin soups);Remove water pitcher    Germain Osgood 05/08/2017,3:46 PM   Germain Osgood, M.A. CCC-SLP [phone]

## 2017-05-08 NOTE — Progress Notes (Signed)
Pt arrived to 5M16 via wheelchair.  Alert, in no apparent distress.  VSS.  Will continue to monitor.  Cori Razor, RN

## 2017-05-09 ENCOUNTER — Inpatient Hospital Stay (HOSPITAL_COMMUNITY): Payer: PPO

## 2017-05-09 LAB — CBC
HCT: 39.7 % (ref 39.0–52.0)
Hemoglobin: 14 g/dL (ref 13.0–17.0)
MCH: 33 pg (ref 26.0–34.0)
MCHC: 35.3 g/dL (ref 30.0–36.0)
MCV: 93.6 fL (ref 78.0–100.0)
Platelets: 210 10*3/uL (ref 150–400)
RBC: 4.24 MIL/uL (ref 4.22–5.81)
RDW: 12.5 % (ref 11.5–15.5)
WBC: 6.7 10*3/uL (ref 4.0–10.5)

## 2017-05-09 LAB — BASIC METABOLIC PANEL
Anion gap: 9 (ref 5–15)
BUN: 12 mg/dL (ref 6–20)
CO2: 27 mmol/L (ref 22–32)
Calcium: 9.3 mg/dL (ref 8.9–10.3)
Chloride: 102 mmol/L (ref 101–111)
Creatinine, Ser: 0.8 mg/dL (ref 0.61–1.24)
GFR calc Af Amer: >60 mL/min (ref >60)
GFR calc non Af Amer: >60 mL/min (ref >60)
Glucose, Bld: 94 mg/dL (ref 65–99)
Potassium: 3.4 mmol/L — ABNORMAL LOW (ref 3.5–5.1)
Sodium: 138 mmol/L (ref 135–145)

## 2017-05-09 MED ORDER — VITAMIN D3 50 MCG (2000 UT) PO TABS: 1.0000 | ORAL_TABLET | Freq: Every day | ORAL | Status: DC

## 2017-05-09 MED ORDER — PRAVASTATIN SODIUM 40 MG PO TABS
40.0000 mg | ORAL_TABLET | Freq: Every day | ORAL | Status: DC
  Administered 2017-05-09 – 2017-05-11 (×3): 40 mg via ORAL
  Filled 2017-05-09 (×3): qty 1

## 2017-05-09 MED ORDER — POTASSIUM CHLORIDE CRYS ER 20 MEQ PO TBCR
40.0000 meq | EXTENDED_RELEASE_TABLET | Freq: Once | ORAL | Status: AC
  Administered 2017-05-09: 40 meq via ORAL
  Filled 2017-05-09: qty 2

## 2017-05-09 MED ORDER — ASPIRIN EC 81 MG PO TBEC
81.0000 mg | DELAYED_RELEASE_TABLET | Freq: Every day | ORAL | Status: DC
  Administered 2017-05-09 – 2017-05-12 (×4): 81 mg via ORAL
  Filled 2017-05-09 (×4): qty 1

## 2017-05-09 MED ORDER — SERTRALINE HCL 50 MG PO TABS
75.0000 mg | ORAL_TABLET | Freq: Every day | ORAL | Status: DC
  Administered 2017-05-09 – 2017-05-12 (×4): 75 mg via ORAL
  Filled 2017-05-09 (×4): qty 2

## 2017-05-09 MED ORDER — SODIUM CHLORIDE 0.9 % IV SOLN
500.0000 mg | Freq: Two times a day (BID) | INTRAVENOUS | Status: DC
  Administered 2017-05-09 – 2017-05-12 (×6): 500 mg via INTRAVENOUS
  Filled 2017-05-09 (×7): qty 5

## 2017-05-09 MED ORDER — POTASSIUM CHLORIDE CRYS ER 20 MEQ PO TBCR
20.0000 meq | EXTENDED_RELEASE_TABLET | Freq: Every day | ORAL | Status: DC
  Administered 2017-05-10 – 2017-05-12 (×3): 20 meq via ORAL
  Filled 2017-05-09 (×3): qty 1

## 2017-05-09 MED ORDER — CHOLECALCIFEROL 10 MCG (400 UNIT) PO TABS
400.0000 [IU] | ORAL_TABLET | Freq: Every day | ORAL | Status: DC
  Administered 2017-05-10 – 2017-05-12 (×3): 400 [IU] via ORAL
  Filled 2017-05-09 (×3): qty 1

## 2017-05-09 MED ORDER — PANTOPRAZOLE SODIUM 40 MG PO TBEC
40.0000 mg | DELAYED_RELEASE_TABLET | Freq: Every day | ORAL | Status: DC
  Administered 2017-05-09 – 2017-05-12 (×4): 40 mg via ORAL
  Filled 2017-05-09 (×3): qty 1

## 2017-05-09 MED ORDER — FOLIC ACID 1 MG PO TABS
2.0000 mg | ORAL_TABLET | Freq: Every day | ORAL | Status: DC
  Administered 2017-05-09 – 2017-05-12 (×4): 2 mg via ORAL
  Filled 2017-05-09 (×4): qty 2

## 2017-05-09 NOTE — Progress Notes (Signed)
structures are symmetric without focal signal abnormality. Small foci of T2 hyperintensity in the cerebral white matter are unchanged from the prior MRI and nonspecific but compatible with mild chronic small vessel ischemic disease. Vascular: Major intracranial vascular flow voids are preserved. Skull and upper cervical spine: Unremarkable bone marrow signal. Sinuses/Orbits: Bilateral cataract extraction. Mild bilateral ethmoid air cell mucosal thickening. No significant mastoid fluid. Other: None. IMPRESSION: 1. No acute intracranial abnormality. 2. Mild chronic small vessel ischemic disease. 3. 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 Electronically Signed   By: Logan Bores M.D.   On: 05/09/2017 09:08   Dg Chest Port 1 View  Result Date: 05/07/2017 CLINICAL DATA:  Acute respiratory failure EXAM: PORTABLE CHEST 1 VIEW COMPARISON:  05/05/2017 FINDINGS: Cardiac shadow is stable. Aortic calcifications are seen. Postoperative changes are noted. The lungs are well aerated bilaterally. No focal infiltrate is noted. No bony abnormality is seen. IMPRESSION: No active disease. Electronically Signed   By: Inez Catalina M.D.   On: 05/07/2017  07:50   Dg Chest Port 1 View  Result Date: 05/05/2017 CLINICAL DATA:  71 year old male with altered mental status, intubated. EXAM: PORTABLE CHEST 1 VIEW COMPARISON:  0500 hours today and earlier. FINDINGS: Portable AP semi upright view at 1057 hours. Endotracheal tube tip at the level the clavicles in good position. Normal cardiac size and mediastinal contours. Allowing for portable technique the lungs are clear. Improved lung volumes. Stable cholecystectomy clips. Partially visible thoracolumbar posterior spinal hardware. IMPRESSION: 1. Endotracheal tube tip in good position at the level the clavicles. 2.  No acute cardiopulmonary abnormality. Electronically Signed   By: Genevie Ann M.D.   On: 05/05/2017 11:12   Dg Abd Portable 1v  Result Date: 05/06/2017 CLINICAL DATA:  OG tube placement. EXAM: PORTABLE ABDOMEN - 1 VIEW COMPARISON:  None. FINDINGS: OG tube tip is in the region of the distal stomach. Bowel gas pattern is normal. IMPRESSION: OG tube tip in the region of the distal stomach. Electronically Signed   By: Lorriane Shire M.D.   On: 05/06/2017 13:13   Dg Swallowing Func-speech Pathology  Result Date: 05/08/2017 Objective Swallowing Evaluation: Type of Study: MBS-Modified Barium Swallow Study Patient Details Name: Cory Pacheco MRN: [id number] Date of Birth: 10-17-1946 Today's Date: 05/08/2017 Time: SLP Start Time (ACUTE ONLY): 1301-SLP Stop Time (ACUTE ONLY): 1312 SLP Time Calculation (min) (ACUTE ONLY): 11 min Past Medical History: Past Medical History: Diagnosis Date . Arthritis  . Depression  . Hypertension  Past Surgical History: Past Surgical History: Procedure Laterality Date . CHOLECYSTECTOMY   . COLONOSCOPY   . COLONOSCOPY N/A 08/03/2013  Procedure: COLONOSCOPY;  Surgeon: Rogene Houston, MD;  [facility];  Service: Endoscopy;  Laterality: N/A;  40 . TONSILLECTOMY   HPI: Cory Clauson Crownoveris an 71 y.o.malewho has a seizure history and is followed by the [facility].  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 5 years ago he suffered a fall in which she started to have seizures 3 months later. He is continuing to have poorly controlled seizure activity. e did suffer a grand mal seizure the last for about 1 minute and then was having decreased airway which at that point he was intubated, 05/05/17 with extubation 05/06/17. Chest x ray without active disease.   Subjective: pt alert, requires cueing Assessment / Plan / Recommendation CHL IP CLINICAL IMPRESSIONS 05/08/2017 Clinical Impression Pt has a moderate oropharyngeal dysphagia, with oral phase largely impacted by mentation. He has reduced bolus cohesion and slow transit  structures are symmetric without focal signal abnormality. Small foci of T2 hyperintensity in the cerebral white matter are unchanged from the prior MRI and nonspecific but compatible with mild chronic small vessel ischemic disease. Vascular: Major intracranial vascular flow voids are preserved. Skull and upper cervical spine: Unremarkable bone marrow signal. Sinuses/Orbits: Bilateral cataract extraction. Mild bilateral ethmoid air cell mucosal thickening. No significant mastoid fluid. Other: None. IMPRESSION: 1. No acute intracranial abnormality. 2. Mild chronic small vessel ischemic disease. 3. 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 Electronically Signed   By: Logan Bores M.D.   On: 05/09/2017 09:08   Dg Chest Port 1 View  Result Date: 05/07/2017 CLINICAL DATA:  Acute respiratory failure EXAM: PORTABLE CHEST 1 VIEW COMPARISON:  05/05/2017 FINDINGS: Cardiac shadow is stable. Aortic calcifications are seen. Postoperative changes are noted. The lungs are well aerated bilaterally. No focal infiltrate is noted. No bony abnormality is seen. IMPRESSION: No active disease. Electronically Signed   By: Inez Catalina M.D.   On: 05/07/2017  07:50   Dg Chest Port 1 View  Result Date: 05/05/2017 CLINICAL DATA:  71 year old male with altered mental status, intubated. EXAM: PORTABLE CHEST 1 VIEW COMPARISON:  0500 hours today and earlier. FINDINGS: Portable AP semi upright view at 1057 hours. Endotracheal tube tip at the level the clavicles in good position. Normal cardiac size and mediastinal contours. Allowing for portable technique the lungs are clear. Improved lung volumes. Stable cholecystectomy clips. Partially visible thoracolumbar posterior spinal hardware. IMPRESSION: 1. Endotracheal tube tip in good position at the level the clavicles. 2.  No acute cardiopulmonary abnormality. Electronically Signed   By: Genevie Ann M.D.   On: 05/05/2017 11:12   Dg Abd Portable 1v  Result Date: 05/06/2017 CLINICAL DATA:  OG tube placement. EXAM: PORTABLE ABDOMEN - 1 VIEW COMPARISON:  None. FINDINGS: OG tube tip is in the region of the distal stomach. Bowel gas pattern is normal. IMPRESSION: OG tube tip in the region of the distal stomach. Electronically Signed   By: Lorriane Shire M.D.   On: 05/06/2017 13:13   Dg Swallowing Func-speech Pathology  Result Date: 05/08/2017 Objective Swallowing Evaluation: Type of Study: MBS-Modified Barium Swallow Study Patient Details Name: Cory Pacheco MRN: [id number] Date of Birth: 10-17-1946 Today's Date: 05/08/2017 Time: SLP Start Time (ACUTE ONLY): 1301-SLP Stop Time (ACUTE ONLY): 1312 SLP Time Calculation (min) (ACUTE ONLY): 11 min Past Medical History: Past Medical History: Diagnosis Date . Arthritis  . Depression  . Hypertension  Past Surgical History: Past Surgical History: Procedure Laterality Date . CHOLECYSTECTOMY   . COLONOSCOPY   . COLONOSCOPY N/A 08/03/2013  Procedure: COLONOSCOPY;  Surgeon: Rogene Houston, MD;  [facility];  Service: Endoscopy;  Laterality: N/A;  40 . TONSILLECTOMY   HPI: Cory Clauson Crownoveris an 71 y.o.malewho has a seizure history and is followed by the [facility] hospital.  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 5 years ago he suffered a fall in which she started to have seizures 3 months later. He is continuing to have poorly controlled seizure activity. e did suffer a grand mal seizure the last for about 1 minute and then was having decreased airway which at that point he was intubated, 05/05/17 with extubation 05/06/17. Chest x ray without active disease.   Subjective: pt alert, requires cueing Assessment / Plan / Recommendation CHL IP CLINICAL IMPRESSIONS 05/08/2017 Clinical Impression Pt has a moderate oropharyngeal dysphagia, with oral phase largely impacted by mentation. He has reduced bolus cohesion and slow transit  structures are symmetric without focal signal abnormality. Small foci of T2 hyperintensity in the cerebral white matter are unchanged from the prior MRI and nonspecific but compatible with mild chronic small vessel ischemic disease. Vascular: Major intracranial vascular flow voids are preserved. Skull and upper cervical spine: Unremarkable bone marrow signal. Sinuses/Orbits: Bilateral cataract extraction. Mild bilateral ethmoid air cell mucosal thickening. No significant mastoid fluid. Other: None. IMPRESSION: 1. No acute intracranial abnormality. 2. Mild chronic small vessel ischemic disease. 3. 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 Electronically Signed   By: Logan Bores M.D.   On: 05/09/2017 09:08   Dg Chest Port 1 View  Result Date: 05/07/2017 CLINICAL DATA:  Acute respiratory failure EXAM: PORTABLE CHEST 1 VIEW COMPARISON:  05/05/2017 FINDINGS: Cardiac shadow is stable. Aortic calcifications are seen. Postoperative changes are noted. The lungs are well aerated bilaterally. No focal infiltrate is noted. No bony abnormality is seen. IMPRESSION: No active disease. Electronically Signed   By: Inez Catalina M.D.   On: 05/07/2017  07:50   Dg Chest Port 1 View  Result Date: 05/05/2017 CLINICAL DATA:  71 year old male with altered mental status, intubated. EXAM: PORTABLE CHEST 1 VIEW COMPARISON:  0500 hours today and earlier. FINDINGS: Portable AP semi upright view at 1057 hours. Endotracheal tube tip at the level the clavicles in good position. Normal cardiac size and mediastinal contours. Allowing for portable technique the lungs are clear. Improved lung volumes. Stable cholecystectomy clips. Partially visible thoracolumbar posterior spinal hardware. IMPRESSION: 1. Endotracheal tube tip in good position at the level the clavicles. 2.  No acute cardiopulmonary abnormality. Electronically Signed   By: Genevie Ann M.D.   On: 05/05/2017 11:12   Dg Abd Portable 1v  Result Date: 05/06/2017 CLINICAL DATA:  OG tube placement. EXAM: PORTABLE ABDOMEN - 1 VIEW COMPARISON:  None. FINDINGS: OG tube tip is in the region of the distal stomach. Bowel gas pattern is normal. IMPRESSION: OG tube tip in the region of the distal stomach. Electronically Signed   By: Lorriane Shire M.D.   On: 05/06/2017 13:13   Dg Swallowing Func-speech Pathology  Result Date: 05/08/2017 Objective Swallowing Evaluation: Type of Study: MBS-Modified Barium Swallow Study Patient Details Name: Cory Pacheco MRN: [id number] Date of Birth: 10-17-1946 Today's Date: 05/08/2017 Time: SLP Start Time (ACUTE ONLY): 1301-SLP Stop Time (ACUTE ONLY): 1312 SLP Time Calculation (min) (ACUTE ONLY): 11 min Past Medical History: Past Medical History: Diagnosis Date . Arthritis  . Depression  . Hypertension  Past Surgical History: Past Surgical History: Procedure Laterality Date . CHOLECYSTECTOMY   . COLONOSCOPY   . COLONOSCOPY N/A 08/03/2013  Procedure: COLONOSCOPY;  Surgeon: Rogene Houston, MD;  [facility];  Service: Endoscopy;  Laterality: N/A;  40 . TONSILLECTOMY   HPI: Cory Clauson Crownoveris an 71 y.o.malewho has a seizure history and is followed by the [facility] hospital.  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 5 years ago he suffered a fall in which she started to have seizures 3 months later. He is continuing to have poorly controlled seizure activity. e did suffer a grand mal seizure the last for about 1 minute and then was having decreased airway which at that point he was intubated, 05/05/17 with extubation 05/06/17. Chest x ray without active disease.   Subjective: pt alert, requires cueing Assessment / Plan / Recommendation CHL IP CLINICAL IMPRESSIONS 05/08/2017 Clinical Impression Pt has a moderate oropharyngeal dysphagia, with oral phase largely impacted by mentation. He has reduced bolus cohesion and slow transit  structures are symmetric without focal signal abnormality. Small foci of T2 hyperintensity in the cerebral white matter are unchanged from the prior MRI and nonspecific but compatible with mild chronic small vessel ischemic disease. Vascular: Major intracranial vascular flow voids are preserved. Skull and upper cervical spine: Unremarkable bone marrow signal. Sinuses/Orbits: Bilateral cataract extraction. Mild bilateral ethmoid air cell mucosal thickening. No significant mastoid fluid. Other: None. IMPRESSION: 1. No acute intracranial abnormality. 2. Mild chronic small vessel ischemic disease. 3. 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 Electronically Signed   By: Logan Bores M.D.   On: 05/09/2017 09:08   Dg Chest Port 1 View  Result Date: 05/07/2017 CLINICAL DATA:  Acute respiratory failure EXAM: PORTABLE CHEST 1 VIEW COMPARISON:  05/05/2017 FINDINGS: Cardiac shadow is stable. Aortic calcifications are seen. Postoperative changes are noted. The lungs are well aerated bilaterally. No focal infiltrate is noted. No bony abnormality is seen. IMPRESSION: No active disease. Electronically Signed   By: Inez Catalina M.D.   On: 05/07/2017  07:50   Dg Chest Port 1 View  Result Date: 05/05/2017 CLINICAL DATA:  71 year old male with altered mental status, intubated. EXAM: PORTABLE CHEST 1 VIEW COMPARISON:  0500 hours today and earlier. FINDINGS: Portable AP semi upright view at 1057 hours. Endotracheal tube tip at the level the clavicles in good position. Normal cardiac size and mediastinal contours. Allowing for portable technique the lungs are clear. Improved lung volumes. Stable cholecystectomy clips. Partially visible thoracolumbar posterior spinal hardware. IMPRESSION: 1. Endotracheal tube tip in good position at the level the clavicles. 2.  No acute cardiopulmonary abnormality. Electronically Signed   By: Genevie Ann M.D.   On: 05/05/2017 11:12   Dg Abd Portable 1v  Result Date: 05/06/2017 CLINICAL DATA:  OG tube placement. EXAM: PORTABLE ABDOMEN - 1 VIEW COMPARISON:  None. FINDINGS: OG tube tip is in the region of the distal stomach. Bowel gas pattern is normal. IMPRESSION: OG tube tip in the region of the distal stomach. Electronically Signed   By: Lorriane Shire M.D.   On: 05/06/2017 13:13   Dg Swallowing Func-speech Pathology  Result Date: 05/08/2017 Objective Swallowing Evaluation: Type of Study: MBS-Modified Barium Swallow Study Patient Details Name: Cory Pacheco MRN: [id number] Date of Birth: 10-17-1946 Today's Date: 05/08/2017 Time: SLP Start Time (ACUTE ONLY): 1301-SLP Stop Time (ACUTE ONLY): 1312 SLP Time Calculation (min) (ACUTE ONLY): 11 min Past Medical History: Past Medical History: Diagnosis Date . Arthritis  . Depression  . Hypertension  Past Surgical History: Past Surgical History: Procedure Laterality Date . CHOLECYSTECTOMY   . COLONOSCOPY   . COLONOSCOPY N/A 08/03/2013  Procedure: COLONOSCOPY;  Surgeon: Rogene Houston, MD;  [facility];  Service: Endoscopy;  Laterality: N/A;  40 . TONSILLECTOMY   HPI: Cory Clauson Crownoveris an 71 y.o.malewho has a seizure history and is followed by the [facility] hospital.  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 5 years ago he suffered a fall in which she started to have seizures 3 months later. He is continuing to have poorly controlled seizure activity. e did suffer a grand mal seizure the last for about 1 minute and then was having decreased airway which at that point he was intubated, 05/05/17 with extubation 05/06/17. Chest x ray without active disease.   Subjective: pt alert, requires cueing Assessment / Plan / Recommendation CHL IP CLINICAL IMPRESSIONS 05/08/2017 Clinical Impression Pt has a moderate oropharyngeal dysphagia, with oral phase largely impacted by mentation. He has reduced bolus cohesion and slow transit  PROGRESS NOTE                                                                                                                                                                                                             Patient Demographics:    Michelangelo Rindfleisch, is a 71 y.o. male, DOB - 07/11/1946, JFH:[id number]  Admit date - 05/05/2017   Admitting Physician Rigoberto Noel, MD  Outpatient Primary MD for the patient is Caryl Bis, MD  LOS - 4  Brief Narrative   71 year old male with history of seizure managed on depakote. Presented 5/22 with one week confusion following a seizure. Had seizure again in ED. Intubated after prolonged postictal period and transferred to [facility], seen by neurology, seizure has been controlled, successfully extubated.   Subjective:    [name] today Denies Any complaints today, reports he had could not sleep, finished all his breakfast, denies any chest pain, shortness of breath, nausea or vomiting .    Assessment  & Plan :    Active Problems:   Seizure ([place])   Status epilepticus ([place])   Acute respiratory failure ([facility])   Pressure injury of skin   Encounter for orogastric (OG) tube placement  Encephalopathy - Secondary to seizures/postictal seizures, not back at baseline yet, currently with delirium, this appears to be improving. - for  modified barium swallow today, - MRI brain with no acute findings.  Seizures - Management per neurology, continuous video EEG 5/24, suggestive of severe encephalopathy, with no epileptiform discharges or seizures were present. - On Keppra and valproic acid, Keppra dose has been decreased to 500 mg twice a day - Follow with Dr. Raynelle Highland , as an outpatient per neuro recommendation  Respiratory failure - intubated  for airway protection, successfully extubated, back to baseline - Resolved  Hypertension - Blood pressure acceptable on amlodipine , will resume on  amlodipine, continue to hold hydrochlorothiazide and lisinopril  Hyperlipidemia - Resume statin  GERD - Continue with PPI  Code Status : Full  Family Communication  : wife at bedside  Disposition Plan  : SNF in 24 hours.  Consults  :  PCCM, neurology  Procedures  : ETT, continuous EEG  DVT Prophylaxis  :   Heparin - SCDs   Lab Results  Component Value Date   PLT 210  structures are symmetric without focal signal abnormality. Small foci of T2 hyperintensity in the cerebral white matter are unchanged from the prior MRI and nonspecific but compatible with mild chronic small vessel ischemic disease. Vascular: Major intracranial vascular flow voids are preserved. Skull and upper cervical spine: Unremarkable bone marrow signal. Sinuses/Orbits: Bilateral cataract extraction. Mild bilateral ethmoid air cell mucosal thickening. No significant mastoid fluid. Other: None. IMPRESSION: 1. No acute intracranial abnormality. 2. Mild chronic small vessel ischemic disease. 3. 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 Electronically Signed   By: Logan Bores M.D.   On: 05/09/2017 09:08   Dg Chest Port 1 View  Result Date: 05/07/2017 CLINICAL DATA:  Acute respiratory failure EXAM: PORTABLE CHEST 1 VIEW COMPARISON:  05/05/2017 FINDINGS: Cardiac shadow is stable. Aortic calcifications are seen. Postoperative changes are noted. The lungs are well aerated bilaterally. No focal infiltrate is noted. No bony abnormality is seen. IMPRESSION: No active disease. Electronically Signed   By: Inez Catalina M.D.   On: 05/07/2017  07:50   Dg Chest Port 1 View  Result Date: 05/05/2017 CLINICAL DATA:  71 year old male with altered mental status, intubated. EXAM: PORTABLE CHEST 1 VIEW COMPARISON:  0500 hours today and earlier. FINDINGS: Portable AP semi upright view at 1057 hours. Endotracheal tube tip at the level the clavicles in good position. Normal cardiac size and mediastinal contours. Allowing for portable technique the lungs are clear. Improved lung volumes. Stable cholecystectomy clips. Partially visible thoracolumbar posterior spinal hardware. IMPRESSION: 1. Endotracheal tube tip in good position at the level the clavicles. 2.  No acute cardiopulmonary abnormality. Electronically Signed   By: Genevie Ann M.D.   On: 05/05/2017 11:12   Dg Abd Portable 1v  Result Date: 05/06/2017 CLINICAL DATA:  OG tube placement. EXAM: PORTABLE ABDOMEN - 1 VIEW COMPARISON:  None. FINDINGS: OG tube tip is in the region of the distal stomach. Bowel gas pattern is normal. IMPRESSION: OG tube tip in the region of the distal stomach. Electronically Signed   By: Lorriane Shire M.D.   On: 05/06/2017 13:13   Dg Swallowing Func-speech Pathology  Result Date: 05/08/2017 Objective Swallowing Evaluation: Type of Study: MBS-Modified Barium Swallow Study Patient Details Name: Cory Pacheco MRN: [id number] Date of Birth: 10-17-1946 Today's Date: 05/08/2017 Time: SLP Start Time (ACUTE ONLY): 1301-SLP Stop Time (ACUTE ONLY): 1312 SLP Time Calculation (min) (ACUTE ONLY): 11 min Past Medical History: Past Medical History: Diagnosis Date . Arthritis  . Depression  . Hypertension  Past Surgical History: Past Surgical History: Procedure Laterality Date . CHOLECYSTECTOMY   . COLONOSCOPY   . COLONOSCOPY N/A 08/03/2013  Procedure: COLONOSCOPY;  Surgeon: Rogene Houston, MD;  [facility];  Service: Endoscopy;  Laterality: N/A;  40 . TONSILLECTOMY   HPI: Cory Clauson Crownoveris an 71 y.o.malewho has a seizure history and is followed by the [facility] hospital.  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 5 years ago he suffered a fall in which she started to have seizures 3 months later. He is continuing to have poorly controlled seizure activity. e did suffer a grand mal seizure the last for about 1 minute and then was having decreased airway which at that point he was intubated, 05/05/17 with extubation 05/06/17. Chest x ray without active disease.   Subjective: pt alert, requires cueing Assessment / Plan / Recommendation CHL IP CLINICAL IMPRESSIONS 05/08/2017 Clinical Impression Pt has a moderate oropharyngeal dysphagia, with oral phase largely impacted by mentation. He has reduced bolus cohesion and slow transit

## 2017-05-09 NOTE — Progress Notes (Signed)
Going for MRI at 0705.

## 2017-05-09 NOTE — Progress Notes (Signed)
Subjective: Continues to improve, they did see cows this morning.  Exam: Vitals:   05/09/17 0616 05/09/17 0854  BP: 139/86 127/68  Pulse: 73 85  Resp: 16 16  Temp: 98 F (36.7 C) 97.6 F (36.4 C)   Gen: In bed, NAD Resp: non-labored breathing, no acute distress Abd: soft, nt  Neuro: MS: Opens eyes, follows commands, he is Oriented to hospital, Year and month CN: Pupils equal round reactive, extra movements intact, face symmetric Motor: He displays good strength in all 4 extremities Sensory: Endorses symmetric sensation to light touch bilaterally  Pertinent Labs: BMP-unremarkable  Impression: 71 year old male with breakthrough seizures who was intubated in the setting of postictal state. He is currently mildly delirious, but improving. I do wonder about some underlying neurocognitive deficit. I would expect him to continue to improve. MRI with no acute findings. Keppra can be slightly delayed. Jenna and I will reduce the dose of this today.  Recommendations: 1) decrease Keppra to 500 mg twice a day 2) valproic acid 500 mg 3 times a day 3) continue conservative therapy, I suspect that his delirium will gradually resolve over the coming weeks. 4) no further recommendations from a neurological standpoint this time, he can follow-up with outpatient neurology. Dr. Merlene Laughter in [place] would be the closest to them.  Roland Rack, MD [facility] [phone]  If 7pm- 7am, please page neurology on call as listed in [place].

## 2017-05-10 LAB — CULTURE, BLOOD (ROUTINE X 2)
Culture: NO GROWTH
Culture: NO GROWTH
Special Requests: ADEQUATE
Special Requests: ADEQUATE

## 2017-05-10 NOTE — Progress Notes (Signed)
Speech Language Pathology Treatment: Dysphagia  Patient Details Name: Cory Pacheco MRN: [id number] DOB: [birth year] Today's Date: 05/10/2017 Time: 2952-8413 SLP Time Calculation (min) (ACUTE ONLY): 20 min  Assessment / Plan / Recommendation Clinical Impression  Patient seen for dysphagia treatment; upright in chair with wife at side; lunch tray had just been delivered. Pt self feeds bites of puree, sips of honey-thick liquids with occasional cues to reduce bolus size, complete intermittent double swallow. Intermittent wet vocal quality; pt clears throat and completes dry swallow to clear with verbal cues. Given silent aspiration visualized on MBS, intermittent wet vocal quality at bedside and need for consistent cues for swallow compensation strategies, recommend continuing with current diet of dys 1 with honey-thick liquids. Pt will require continued SLP services in next venue of care to address deficits, pt would benefit repeat instrumental (MBS or FEES) in next level of care to determine appropriateness for advancement. Provided education to pt's wife re: these recommendations; she demonstrates understanding of verbal cuing for swallow strategies.   HPI HPI: Cory Pacheco a 71 y.o.malewho has a seizure history and is followed by the [facility] hospital. 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 5 years ago he suffered a fall in which she started to have seizures 3 months later. He is continuing to have poorly controlled seizure activity. e did suffer a grand mal seizure the last for about 1 minute and then was having decreased airway which at that point he was intubated, 05/05/17 with extubation 05/06/17. Chest x ray without active disease.        SLP Plan  Continue with current plan of care       Recommendations  Diet recommendations: Dysphagia 1 (puree);Honey-thick liquid Liquids provided via: Cup;No straw Medication Administration: Whole meds with puree Supervision: Full  supervision/cueing for compensatory strategies Compensations: Minimize environmental distractions;Slow rate;Small sips/bites;Clear throat intermittently;Multiple dry swallows after each bite/sip                Oral Care Recommendations: Oral care BID Follow up Recommendations: Skilled Nursing facility SLP Visit Diagnosis: Dysphagia, oropharyngeal phase (R13.12) Plan: Continue with current plan of care       GO               Cory Baton, MS, CCC-SLP Speech-Language Pathologist [phone]  Cory Pacheco 05/10/2017, 12:11 PM

## 2017-05-10 NOTE — NC FL2 (Signed)
[place] MEDICAID FL2 LEVEL OF CARE SCREENING TOOL     IDENTIFICATION  Patient Name: Cory Pacheco Birthdate: Apr 16, 1946 Sex: male Admission Date (Current Location): 05/05/2017  [facility] and [place] Number:  Producer, television/film/video and Address:  [facility]. [facility], [address] [place], [place] [address]      Provider Number: [id number]  Attending Physician Name and Address:  Elgergawy, Leana Roe, MD  Relative Name and Phone Number:       Current Level of Care: Hospital Recommended Level of Care: Skilled Nursing Facility Prior Approval Number:    Date Approved/Denied:   PASRR Number:    Discharge Plan: SNF    Current Diagnoses: Patient Active Problem List   Diagnosis Date Noted  . Pressure injury of skin 05/06/2017  . Encounter for orogastric (OG) tube placement   . Seizure (HCC) 05/05/2017  . Status epilepticus (HCC)   . Acute respiratory failure (HCC)     Orientation RESPIRATION BLADDER Height & Weight     Self  Normal Continent Weight: 244 lb 4.8 oz (110.8 kg) Height:  6\' 2"  (188 cm)  BEHAVIORAL SYMPTOMS/MOOD NEUROLOGICAL BOWEL NUTRITION STATUS    Convulsions/Seizures Continent Diet (DYS 1 diet; Fluid consistency: Honey Thick)  AMBULATORY STATUS COMMUNICATION OF NEEDS Skin   Limited Assist Verbally PU Stage and Appropriate Care (Location: sacrum) PU Stage 1 Dressing: Daily (Foam dressing changed every 3 days)                     Personal Care Assistance Level of Assistance  Bathing, Feeding, Dressing Bathing Assistance: Limited assistance Feeding assistance: Limited assistance Dressing Assistance: Limited assistance     Functional Limitations Info  Sight, Hearing, Speech Sight Info: Adequate Hearing Info: Adequate Speech Info: Adequate    SPECIAL CARE FACTORS FREQUENCY  PT (By licensed PT), OT (By licensed OT)     PT Frequency: 5x OT Frequency: 5x            Contractures Contractures Info: Not present     Additional Factors Info  Code Status, Allergies, Psychotropic Code Status Info: Full Allergies Info: Bupropion, Metformin, Sulfa Antibiotics Psychotropic Info: See med list         Current Medications (05/10/2017):  This is the current hospital active medication list Current Facility-Administered Medications  Medication Dose Route Frequency Provider Last Rate Last Dose  . 0.9 %  sodium chloride infusion   Intravenous Continuous Nelda Bucks, MD 10 mL/hr at 05/08/17 0200    . acetaminophen (TYLENOL) tablet 650 mg  650 mg Oral Q4H PRN Duayne Cal, NP      . ALPRAZolam Prudy Feeler) tablet 0.25 mg  0.25 mg Oral BID PRN Elgergawy, Leana Roe, MD      . amLODipine (NORVASC) tablet 2.5 mg  2.5 mg Oral Daily Elgergawy, Leana Roe, MD   2.5 mg at 05/10/17 1008  . aspirin EC tablet 81 mg  81 mg Oral Daily Elgergawy, Leana Roe, MD   81 mg at 05/10/17 1008  . cholecalciferol (VITAMIN D) tablet 400 Units  400 Units Oral Daily Elgergawy, Leana Roe, MD   400 Units at 05/10/17 1009  . folic acid (FOLVITE) tablet 2 mg  2 mg Oral Daily Elgergawy, Leana Roe, MD   2 mg at 05/10/17 1008  . heparin injection 5,000 Units  5,000 Units Subcutaneous Q8H Duayne Cal, NP   5,000 Units at 05/10/17 0502  . levETIRAcetam (KEPPRA) 500 mg in sodium chloride 0.9 %  100 mL IVPB  500 mg Intravenous Q12H Rejeana Brock, MD   Stopped at 05/10/17 [phone]  . LORazepam (ATIVAN) injection 2 mg  2 mg Intravenous PRN Coralyn Helling, MD   2 mg at 05/06/17 2345  . MEDLINE mouth rinse  15 mL Mouth Rinse q12n4p Nelda Bucks, MD   15 mL at 05/09/17 1600  . multivitamin liquid 15 mL  15 mL Per Tube Daily Nelda Bucks, MD   15 mL at 05/10/17 1007  . pantoprazole (PROTONIX) EC tablet 40 mg  40 mg Oral Daily Elgergawy, Leana Roe, MD   40 mg at 05/10/17 1008  . potassium chloride SA (K-DUR,KLOR-CON) CR tablet 20 mEq  20 mEq Oral Daily Elgergawy, Leana Roe, MD   20 mEq at 05/10/17 1010  . pravastatin (PRAVACHOL) tablet 40  mg  40 mg Oral QHS Elgergawy, Leana Roe, MD   40 mg at 05/09/17 2138  . RESOURCE THICKENUP CLEAR   Oral PRN Elgergawy, Leana Roe, MD      . sertraline (ZOLOFT) tablet 75 mg  75 mg Oral Daily Elgergawy, Leana Roe, MD   75 mg at 05/10/17 1007  . valproate (DEPACON) 500 mg in dextrose 5 % 50 mL IVPB  500 mg Intravenous Q8H Rejeana Brock, MD   Stopped at 05/10/17 0602     Discharge Medications: Please see discharge summary for a list of discharge medications.  Relevant Imaging Results:  Relevant Lab Results:   Additional Information SSN: [id number]  Dominic Pea, LCSW

## 2017-05-10 NOTE — Clinical Social Work Placement (Signed)
CLINICAL SOCIAL WORK PLACEMENT  NOTE  Date:  05/10/2017  Patient Details  Name: Cory Pacheco MRN: [id number] Date of Birth: [birth year]  Clinical Social Work is seeking post-discharge placement for this patient at the Skilled  Nursing Facility level of care (*CSW will initial, date and re-position this form in  chart as items are completed):  Yes   Patient/family provided with [facility] Clinical Social Work Department's list of facilities offering this level of care within the geographic area requested by the patient (or if unable, by the patient's family).  Yes   Patient/family informed of their freedom to choose among providers that offer the needed level of care, that participate in Medicare, Medicaid or managed care program needed by the patient, have an available bed and are willing to accept the patient.  Yes   Patient/family informed of [facility]'s ownership interest in [facility] and [facility], as well as of the fact that they are under no obligation to receive care at these facilities.  PASRR submitted to EDS on 05/10/17     PASRR number received on       Existing PASRR number confirmed on       FL2 transmitted to all facilities in geographic area requested by pt/family on 05/10/17     FL2 transmitted to all facilities within larger geographic area on       Patient informed that his/her managed care company has contracts with or will negotiate with certain facilities, including the following:            Patient/family informed of bed offers received.  Patient chooses bed at       Physician recommends and patient chooses bed at      Patient to be transferred to   on  .  Patient to be transferred to facility by       Patient family notified on   of transfer.  Name of family member notified:        PHYSICIAN Please sign FL2     Additional Comment:    _______________________________________________ Dominic Pea, LCSW 05/10/2017, 12:12  PM

## 2017-05-10 NOTE — Progress Notes (Signed)
PROGRESS NOTE                                                                                                                                                                                                             Patient Demographics:    Cory Pacheco, is a 71 y.o. 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 Presented 5/22 with one week confusion following a seizure. Had seizure again in ED. Intubated after prolonged postictal period and transferred to [facility], seen by neurology, seizure has been controlled, successfully extubated.   Subjective:    Cory Pacheco today Denies Any complaints, reports he is feeling much better today, no chest pain, shortness of breath, no nausea or vomiting .     Assessment  & Plan :    Active Problems:   Seizure ([facility])   Status epilepticus ([place])   Acute respiratory failure ([facility]CC)   Pressure injury of skin   Encounter for orogastric (OG) tube placement  Encephalopathy - Secondary to seizures/postictal seizures,  currently with delirium,Significantly improved, today is awake alert 3. - for  modified barium swallow today, - MRI brain with no acute findings.  Seizures - Management per neurology, continuous video EEG 5/24, suggestive of severe encephalopathy, with no epileptiform discharges or seizures were present. - On Keppra and valproic acid, Keppra dose has been decreased to 500 mg twice a day  Respiratory failure - intubated  for airway protection, successfully extubated, back to baseline - Resolved  Hypertension - Blood pressure acceptable on amlodipine , will resume on amlodipine, continue to hold hydrochlorothiazide and lisinopril  Hyperlipidemia - Resume statin  GERD -  Continue with PPI  Code Status : Full  Family Communication  : wife at bedside  Disposition Plan  : Need SNF placement, awaiting [facility], won't be  available told to stay  Consults  :  PCCM, neurology  Procedures  : ETT, continuous EEG  DVT Prophylaxis  :   Heparin - SCDs   Lab Results  Component Value Date   PLT 210 05/09/2017    Antibiotics  :   Anti-infectives  midline shift, or extra-axial fluid collection. There is mild to moderate cerebral and cerebellar atrophy. The mesial temporal lobe structures are symmetric without focal signal abnormality. Small foci of T2 hyperintensity in the cerebral white matter are unchanged from the prior MRI and nonspecific but compatible with mild chronic small vessel ischemic disease. Vascular: Major intracranial vascular flow voids are preserved. Skull and upper cervical spine: Unremarkable bone marrow signal. Sinuses/Orbits: Bilateral cataract extraction. Mild bilateral ethmoid air cell mucosal thickening. No significant mastoid fluid. Other: None. IMPRESSION: 1. No acute intracranial abnormality. 2. Mild chronic small vessel ischemic disease. 3. 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 Electronically Signed   By: Cory Pacheco M.D.   On: 05/09/2017 09:08   Dg Chest Port 1 View  Result Date: 05/07/2017 CLINICAL DATA:  Acute respiratory failure EXAM: PORTABLE CHEST 1 VIEW COMPARISON:  05/05/2017 FINDINGS: Cardiac shadow is stable. Aortic calcifications are seen. Postoperative changes are noted. The lungs are well aerated bilaterally. No focal infiltrate is noted. No bony abnormality is seen. IMPRESSION: No active disease. Electronically Signed   By: Cory Pacheco M.D.   On: 05/07/2017  07:50   Dg Chest Port 1 View  Result Date: 05/05/2017 CLINICAL DATA:  71 year old male with altered mental status, intubated. EXAM: PORTABLE CHEST 1 VIEW COMPARISON:  0500 hours today and earlier. FINDINGS: Portable AP semi upright view at 1057 hours. Endotracheal tube tip at the level the clavicles in good position. Normal cardiac size and mediastinal contours. Allowing for portable technique the lungs are clear. Improved lung volumes. Stable cholecystectomy clips. Partially visible thoracolumbar posterior spinal hardware. IMPRESSION: 1. Endotracheal tube tip in good position at the level the clavicles. 2.  No acute cardiopulmonary abnormality. Electronically Signed   By: Cory Pacheco M.D.   On: 05/05/2017 11:12   Dg Abd Portable 1v  Result Date: 05/06/2017 CLINICAL DATA:  OG tube placement. EXAM: PORTABLE ABDOMEN - 1 VIEW COMPARISON:  None. FINDINGS: OG tube tip is in the region of the distal stomach. Bowel gas pattern is normal. IMPRESSION: OG tube tip in the region of the distal stomach. Electronically Signed   By: Cory Pacheco M.D.   On: 05/06/2017 13:13   Dg Swallowing Func-speech Pathology  Result Date: 05/08/2017 Objective Swallowing Evaluation: Type of Study: MBS-Modified Barium Swallow Study Patient Details Name: Cory Pacheco MRN: [id number] Date of Birth: [birth year] Today's Date: 05/08/2017 Time: SLP Start Time (ACUTE ONLY): 1301-SLP Stop Time (ACUTE ONLY): 1312 SLP Time Calculation (min) (ACUTE ONLY): 11 min Past Medical History: Past Medical History: Diagnosis Date . Arthritis  . Depression  . Hypertension  Past Surgical History: Past Surgical History: Procedure Laterality Date . CHOLECYSTECTOMY   . COLONOSCOPY   . COLONOSCOPY N/A 08/03/2013  Procedure: COLONOSCOPY;  Surgeon: Cory Houston, MD;  [facility];  Service: Endoscopy;  Laterality: N/A;  71 . TONSILLECTOMY   HPI: Cory Mccauley Crownoveris an 71 y.o.malewho has a seizure history and is followed by the [facility] hospital.  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 5 years ago he suffered a fall in which she started to have seizures 3 months later. He is continuing to have poorly controlled seizure activity. e did suffer a grand mal seizure the last for about 1 minute and then was having decreased airway which at that point he was intubated, 05/05/17 with extubation 05/06/17. Chest x ray without active disease.   Subjective: pt alert, requires cueing Assessment / Plan / Recommendation CHL IP CLINICAL IMPRESSIONS 05/08/2017 Clinical Impression Pt has  midline shift, or extra-axial fluid collection. There is mild to moderate cerebral and cerebellar atrophy. The mesial temporal lobe structures are symmetric without focal signal abnormality. Small foci of T2 hyperintensity in the cerebral white matter are unchanged from the prior MRI and nonspecific but compatible with mild chronic small vessel ischemic disease. Vascular: Major intracranial vascular flow voids are preserved. Skull and upper cervical spine: Unremarkable bone marrow signal. Sinuses/Orbits: Bilateral cataract extraction. Mild bilateral ethmoid air cell mucosal thickening. No significant mastoid fluid. Other: None. IMPRESSION: 1. No acute intracranial abnormality. 2. Mild chronic small vessel ischemic disease. 3. 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 Electronically Signed   By: Cory Pacheco M.D.   On: 05/09/2017 09:08   Dg Chest Port 1 View  Result Date: 05/07/2017 CLINICAL DATA:  Acute respiratory failure EXAM: PORTABLE CHEST 1 VIEW COMPARISON:  05/05/2017 FINDINGS: Cardiac shadow is stable. Aortic calcifications are seen. Postoperative changes are noted. The lungs are well aerated bilaterally. No focal infiltrate is noted. No bony abnormality is seen. IMPRESSION: No active disease. Electronically Signed   By: Cory Pacheco M.D.   On: 05/07/2017  07:50   Dg Chest Port 1 View  Result Date: 05/05/2017 CLINICAL DATA:  71 year old male with altered mental status, intubated. EXAM: PORTABLE CHEST 1 VIEW COMPARISON:  0500 hours today and earlier. FINDINGS: Portable AP semi upright view at 1057 hours. Endotracheal tube tip at the level the clavicles in good position. Normal cardiac size and mediastinal contours. Allowing for portable technique the lungs are clear. Improved lung volumes. Stable cholecystectomy clips. Partially visible thoracolumbar posterior spinal hardware. IMPRESSION: 1. Endotracheal tube tip in good position at the level the clavicles. 2.  No acute cardiopulmonary abnormality. Electronically Signed   By: Cory Pacheco M.D.   On: 05/05/2017 11:12   Dg Abd Portable 1v  Result Date: 05/06/2017 CLINICAL DATA:  OG tube placement. EXAM: PORTABLE ABDOMEN - 1 VIEW COMPARISON:  None. FINDINGS: OG tube tip is in the region of the distal stomach. Bowel gas pattern is normal. IMPRESSION: OG tube tip in the region of the distal stomach. Electronically Signed   By: Cory Pacheco M.D.   On: 05/06/2017 13:13   Dg Swallowing Func-speech Pathology  Result Date: 05/08/2017 Objective Swallowing Evaluation: Type of Study: MBS-Modified Barium Swallow Study Patient Details Name: Cory Pacheco MRN: [id number] Date of Birth: [birth year] Today's Date: 05/08/2017 Time: SLP Start Time (ACUTE ONLY): 1301-SLP Stop Time (ACUTE ONLY): 1312 SLP Time Calculation (min) (ACUTE ONLY): 11 min Past Medical History: Past Medical History: Diagnosis Date . Arthritis  . Depression  . Hypertension  Past Surgical History: Past Surgical History: Procedure Laterality Date . CHOLECYSTECTOMY   . COLONOSCOPY   . COLONOSCOPY N/A 08/03/2013  Procedure: COLONOSCOPY;  Surgeon: Cory Houston, MD;  [facility];  Service: Endoscopy;  Laterality: N/A;  71 . TONSILLECTOMY   HPI: Cory Mccauley Crownoveris an 71 y.o.malewho has a seizure history and is followed by the [facility] hospital.  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 5 years ago he suffered a fall in which she started to have seizures 3 months later. He is continuing to have poorly controlled seizure activity. e did suffer a grand mal seizure the last for about 1 minute and then was having decreased airway which at that point he was intubated, 05/05/17 with extubation 05/06/17. Chest x ray without active disease.   Subjective: pt alert, requires cueing Assessment / Plan / Recommendation CHL IP CLINICAL IMPRESSIONS 05/08/2017 Clinical Impression Pt has  midline shift, or extra-axial fluid collection. There is mild to moderate cerebral and cerebellar atrophy. The mesial temporal lobe structures are symmetric without focal signal abnormality. Small foci of T2 hyperintensity in the cerebral white matter are unchanged from the prior MRI and nonspecific but compatible with mild chronic small vessel ischemic disease. Vascular: Major intracranial vascular flow voids are preserved. Skull and upper cervical spine: Unremarkable bone marrow signal. Sinuses/Orbits: Bilateral cataract extraction. Mild bilateral ethmoid air cell mucosal thickening. No significant mastoid fluid. Other: None. IMPRESSION: 1. No acute intracranial abnormality. 2. Mild chronic small vessel ischemic disease. 3. 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 Electronically Signed   By: Cory Pacheco M.D.   On: 05/09/2017 09:08   Dg Chest Port 1 View  Result Date: 05/07/2017 CLINICAL DATA:  Acute respiratory failure EXAM: PORTABLE CHEST 1 VIEW COMPARISON:  05/05/2017 FINDINGS: Cardiac shadow is stable. Aortic calcifications are seen. Postoperative changes are noted. The lungs are well aerated bilaterally. No focal infiltrate is noted. No bony abnormality is seen. IMPRESSION: No active disease. Electronically Signed   By: Cory Pacheco M.D.   On: 05/07/2017  07:50   Dg Chest Port 1 View  Result Date: 05/05/2017 CLINICAL DATA:  71 year old male with altered mental status, intubated. EXAM: PORTABLE CHEST 1 VIEW COMPARISON:  0500 hours today and earlier. FINDINGS: Portable AP semi upright view at 1057 hours. Endotracheal tube tip at the level the clavicles in good position. Normal cardiac size and mediastinal contours. Allowing for portable technique the lungs are clear. Improved lung volumes. Stable cholecystectomy clips. Partially visible thoracolumbar posterior spinal hardware. IMPRESSION: 1. Endotracheal tube tip in good position at the level the clavicles. 2.  No acute cardiopulmonary abnormality. Electronically Signed   By: Cory Pacheco M.D.   On: 05/05/2017 11:12   Dg Abd Portable 1v  Result Date: 05/06/2017 CLINICAL DATA:  OG tube placement. EXAM: PORTABLE ABDOMEN - 1 VIEW COMPARISON:  None. FINDINGS: OG tube tip is in the region of the distal stomach. Bowel gas pattern is normal. IMPRESSION: OG tube tip in the region of the distal stomach. Electronically Signed   By: Cory Pacheco M.D.   On: 05/06/2017 13:13   Dg Swallowing Func-speech Pathology  Result Date: 05/08/2017 Objective Swallowing Evaluation: Type of Study: MBS-Modified Barium Swallow Study Patient Details Name: Cory Pacheco MRN: [id number] Date of Birth: [birth year] Today's Date: 05/08/2017 Time: SLP Start Time (ACUTE ONLY): 1301-SLP Stop Time (ACUTE ONLY): 1312 SLP Time Calculation (min) (ACUTE ONLY): 11 min Past Medical History: Past Medical History: Diagnosis Date . Arthritis  . Depression  . Hypertension  Past Surgical History: Past Surgical History: Procedure Laterality Date . CHOLECYSTECTOMY   . COLONOSCOPY   . COLONOSCOPY N/A 08/03/2013  Procedure: COLONOSCOPY;  Surgeon: Cory Houston, MD;  [facility];  Service: Endoscopy;  Laterality: N/A;  71 . TONSILLECTOMY   HPI: Cory Mccauley Crownoveris an 71 y.o.malewho has a seizure history and is followed by the [facility] hospital.  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 5 years ago he suffered a fall in which she started to have seizures 3 months later. He is continuing to have poorly controlled seizure activity. e did suffer a grand mal seizure the last for about 1 minute and then was having decreased airway which at that point he was intubated, 05/05/17 with extubation 05/06/17. Chest x ray without active disease.   Subjective: pt alert, requires cueing Assessment / Plan / Recommendation CHL IP CLINICAL IMPRESSIONS 05/08/2017 Clinical Impression Pt has  midline shift, or extra-axial fluid collection. There is mild to moderate cerebral and cerebellar atrophy. The mesial temporal lobe structures are symmetric without focal signal abnormality. Small foci of T2 hyperintensity in the cerebral white matter are unchanged from the prior MRI and nonspecific but compatible with mild chronic small vessel ischemic disease. Vascular: Major intracranial vascular flow voids are preserved. Skull and upper cervical spine: Unremarkable bone marrow signal. Sinuses/Orbits: Bilateral cataract extraction. Mild bilateral ethmoid air cell mucosal thickening. No significant mastoid fluid. Other: None. IMPRESSION: 1. No acute intracranial abnormality. 2. Mild chronic small vessel ischemic disease. 3. 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 Electronically Signed   By: Cory Pacheco M.D.   On: 05/09/2017 09:08   Dg Chest Port 1 View  Result Date: 05/07/2017 CLINICAL DATA:  Acute respiratory failure EXAM: PORTABLE CHEST 1 VIEW COMPARISON:  05/05/2017 FINDINGS: Cardiac shadow is stable. Aortic calcifications are seen. Postoperative changes are noted. The lungs are well aerated bilaterally. No focal infiltrate is noted. No bony abnormality is seen. IMPRESSION: No active disease. Electronically Signed   By: Cory Pacheco M.D.   On: 05/07/2017  07:50   Dg Chest Port 1 View  Result Date: 05/05/2017 CLINICAL DATA:  71 year old male with altered mental status, intubated. EXAM: PORTABLE CHEST 1 VIEW COMPARISON:  0500 hours today and earlier. FINDINGS: Portable AP semi upright view at 1057 hours. Endotracheal tube tip at the level the clavicles in good position. Normal cardiac size and mediastinal contours. Allowing for portable technique the lungs are clear. Improved lung volumes. Stable cholecystectomy clips. Partially visible thoracolumbar posterior spinal hardware. IMPRESSION: 1. Endotracheal tube tip in good position at the level the clavicles. 2.  No acute cardiopulmonary abnormality. Electronically Signed   By: Cory Pacheco M.D.   On: 05/05/2017 11:12   Dg Abd Portable 1v  Result Date: 05/06/2017 CLINICAL DATA:  OG tube placement. EXAM: PORTABLE ABDOMEN - 1 VIEW COMPARISON:  None. FINDINGS: OG tube tip is in the region of the distal stomach. Bowel gas pattern is normal. IMPRESSION: OG tube tip in the region of the distal stomach. Electronically Signed   By: Cory Pacheco M.D.   On: 05/06/2017 13:13   Dg Swallowing Func-speech Pathology  Result Date: 05/08/2017 Objective Swallowing Evaluation: Type of Study: MBS-Modified Barium Swallow Study Patient Details Name: Cory Pacheco MRN: [id number] Date of Birth: [birth year] Today's Date: 05/08/2017 Time: SLP Start Time (ACUTE ONLY): 1301-SLP Stop Time (ACUTE ONLY): 1312 SLP Time Calculation (min) (ACUTE ONLY): 11 min Past Medical History: Past Medical History: Diagnosis Date . Arthritis  . Depression  . Hypertension  Past Surgical History: Past Surgical History: Procedure Laterality Date . CHOLECYSTECTOMY   . COLONOSCOPY   . COLONOSCOPY N/A 08/03/2013  Procedure: COLONOSCOPY;  Surgeon: Cory Houston, MD;  [facility];  Service: Endoscopy;  Laterality: N/A;  71 . TONSILLECTOMY   HPI: Cory Mccauley Crownoveris an 71 y.o.malewho has a seizure history and is followed by the [facility] hospital.  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 5 years ago he suffered a fall in which she started to have seizures 3 months later. He is continuing to have poorly controlled seizure activity. e did suffer a grand mal seizure the last for about 1 minute and then was having decreased airway which at that point he was intubated, 05/05/17 with extubation 05/06/17. Chest x ray without active disease.   Subjective: pt alert, requires cueing Assessment / Plan / Recommendation CHL IP CLINICAL IMPRESSIONS 05/08/2017 Clinical Impression Pt has  PROGRESS NOTE                                                                                                                                                                                                             Patient Demographics:    Cory Pacheco, is a 71 y.o. 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 Presented 5/22 with one week confusion following a seizure. Had seizure again in ED. Intubated after prolonged postictal period and transferred to [facility], seen by neurology, seizure has been controlled, successfully extubated.   Subjective:    Cory Pacheco today Denies Any complaints, reports he is feeling much better today, no chest pain, shortness of breath, no nausea or vomiting .     Assessment  & Plan :    Active Problems:   Seizure ([facility])   Status epilepticus ([place])   Acute respiratory failure (HCC)   Pressure injury of skin   Encounter for orogastric (OG) tube placement  Encephalopathy - Secondary to seizures/postictal seizures,  currently with delirium,Significantly improved, today is awake alert 3. - for  modified barium swallow today, - MRI brain with no acute findings.  Seizures - Management per neurology, continuous video EEG 5/24, suggestive of severe encephalopathy, with no epileptiform discharges or seizures were present. - On Keppra and valproic acid, Keppra dose has been decreased to 500 mg twice a day  Respiratory failure - intubated  for airway protection, successfully extubated, back to baseline - Resolved  Hypertension - Blood pressure acceptable on amlodipine , will resume on amlodipine, continue to hold hydrochlorothiazide and lisinopril  Hyperlipidemia - Resume statin  GERD -  Continue with PPI  Code Status : Full  Family Communication  : wife at bedside  Disposition Plan  : Need SNF placement, awaiting [facility], won't be  available told to stay  Consults  :  PCCM, neurology  Procedures  : ETT, continuous EEG  DVT Prophylaxis  :   Heparin - SCDs   Lab Results  Component Value Date   PLT 210 05/09/2017    Antibiotics  :   Anti-infectives

## 2017-05-10 NOTE — Clinical Social Work Note (Addendum)
Clinical Social Work Assessment  Patient Details  Name: Cory Pacheco MRN: [id number] Date of Birth: 25-May-1946  Date of referral:  05/10/17               Reason for consult:  Discharge Planning                Permission sought to share information with:  Family Supports, Customer service manager Permission granted to share information::  Yes, Verbal Permission Granted  Name::     Unnamed Zeien  Agency::  SNFs  Relationship::  Wife  Contact Information:  [phone]  Housing/Transportation Living arrangements for the past 2 months:  Single Family Home Source of Information:  Spouse Patient Interpreter Needed:  None Criminal Activity/Legal Involvement Pertinent to Current Situation/Hospitalization:  No - Comment as needed Significant Relationships:  Adult Children, Spouse Lives with:  Spouse Do you feel safe going back to the place where you live?  Yes Need for family participation in patient care:  Yes (Comment)  Care giving concerns:  No care giving concerns identified.    Social Worker assessment / plan:  CSW met with pt and wife-Margaret Tippett to address consult for SNF placement. CSW introduced self and explained responsibility of CSW. Pt from home with wife and adult son lives next door. P/T and O/T recommending SNF for STR. Pt in agreement with SNF and prefers [facility], as he was placed there before. 2nd choice is [facility].   CSW sent FL-2 to [facility] and [place]. Pt's previous PASRR #-[id number] E expired 04/21/14. CSW completed PASRR request; pt is a manual review. [place] MUST closed on weekend and Memorial day. Pt CANNOT admit to a facility without PASRR number. CSW spoke with Dedra Skeens at [facility] to inform of pt interest in facility. Dedra Skeens confirmed pt wound need current PASRR number before being admitted, if offered a bed. CSW updated RN, MD, pt, and wife. 30-day note signed and faxed with FL-2 and H&P to [facility]. CSW will continue to follow for family support  and discharge needs.   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  Patient/Family's Understanding of and Emotional Response to Diagnosis, Current Treatment, and Prognosis:  Pt oriented to self only. Wife appears to understand current treatment, diagnosis, and prognosis. Wife very supportive of pt and inquisitive about treatment plan.   Emotional Assessment Appearance:  Appears stated age Attitude/Demeanor/Rapport:  Other (Appropriate) Affect (typically observed):  Accepting Orientation:  Oriented to Self Alcohol / Substance use:  Other Psych involvement (Current and /or in the community):  No (Comment)  Discharge Needs  Concerns to be addressed:  Care Coordination Readmission within the last 30 days:  No Current discharge risk:  Dependent with Mobility Barriers to Discharge:  [place] ([place]) (Manual Review)   Truitt Merle, LCSW 05/10/2017, 12:00 PM

## 2017-05-11 NOTE — Progress Notes (Signed)
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 Presented 5/22 with one week confusion following a seizure. Had seizure again in ED. Intubated after prolonged postictal period and transferred to [facility], seen by neurology, seizure has been controlled, successfully extubated. Encephalopathy.     PT Comments    Pt progressing towards physical therapy goals. Was able to progress gait training this session with assist for balance support and safety. Pt was able to tolerate therapeutic exercise without complaints of pain or fatigue. Will continue to follow and progress as able per POC.    Follow Up Recommendations  SNF;Supervision/Assistance - 24 hour     Equipment Recommendations  None recommended by PT    Recommendations for Other Services       Precautions / Restrictions Precautions Precautions: Fall Restrictions Weight Bearing Restrictions: No    Mobility  Bed Mobility               General bed mobility comments: Pt sitting up in recliner upon PT arrival.   Transfers Overall transfer level: Needs assistance   Transfers: Sit to/from Stand Sit to Stand: Min assist         General transfer comment: VC's for hand placement on seated surface for safety. Pt was able to power-up to full stand with min assist for balance support.   Ambulation/Gait Ambulation/Gait assistance: Min assist Ambulation Distance (Feet): 175 Feet Assistive device: None Gait Pattern/deviations: Step-through pattern;Step-to pattern;Shuffle;Staggering right;Leaning posteriorly;Trunk flexed Gait velocity: decreased Gait velocity interpretation: Below normal speed for age/gender General Gait Details: Pt with flexed posture (hx of spine surgeries), shuffling like gait with decreased stance time RLE, instability noted and occasional  LOB to the right and posteriorly. Unsteady.   Stairs            Wheelchair Mobility    Modified Rankin (Stroke Patients Only) Modified Rankin (Stroke Patients Only) Pre-Morbid Rankin Score: No symptoms Modified Rankin: Moderately severe disability     Balance Overall balance assessment: Needs assistance Sitting-balance support: Feet supported;No upper extremity supported Sitting balance-Leahy Scale: Fair     Standing balance support: During functional activity Standing balance-Leahy Scale: Poor                              Cognition Arousal/Alertness: Awake/alert Behavior During Therapy: Flat affect Overall Cognitive Status: Impaired/Different from baseline Area of Impairment: Orientation;Attention;Memory;Following commands;Safety/judgement;Awareness;Problem solving                 Orientation Level: Disoriented to;Place;Time Current Attention Level: Sustained Memory: Decreased recall of precautions;Decreased short-term memory Following Commands: Follows one step commands with increased time Safety/Judgement: Decreased awareness of safety;Decreased awareness of deficits Awareness: Emergent Problem Solving: Slow processing;Decreased initiation;Difficulty sequencing;Requires verbal cues        Exercises General Exercises - Lower Extremity Quad Sets: 10 reps Long Arc Quad: 10 reps Hip ABduction/ADduction: 10 reps Straight Leg Raises: 10 reps    General Comments        Pertinent Vitals/Pain Pain Assessment: Faces Faces Pain Scale: No hurt    Home Living                      Prior Function            PT Goals (current goals can now be found in  the care plan section) Acute Rehab PT Goals Patient Stated Goal: to get better PT Goal Formulation: With patient/family Time For Goal Achievement: 05/22/17 Potential to Achieve Goals: Fair Progress towards PT goals: Progressing toward goals    Frequency    Min 2X/week       PT Plan Current plan remains appropriate    Co-evaluation              AM-PAC PT "6 Clicks" Daily Activity  Outcome Measure  Difficulty turning over in bed (including adjusting bedclothes, sheets and blankets)?: None Difficulty moving from lying on back to sitting on the side of the bed? : Total Difficulty sitting down on and standing up from a chair with arms (e.g., wheelchair, bedside commode, etc,.)?: Total Help needed moving to and from a bed to chair (including a wheelchair)?: A Little Help needed walking in hospital room?: A Lot Help needed climbing 3-5 steps with a railing? : A Lot 6 Click Score: 13    End of Session Equipment Utilized During Treatment: Gait belt Activity Tolerance: Patient tolerated treatment well Patient left: in chair;with call bell/phone within reach;with family/visitor present;with chair alarm set Nurse Communication: Mobility status PT Visit Diagnosis: Unsteadiness on feet (R26.81);Other symptoms and signs involving the nervous system (R29.898);Other abnormalities of gait and mobility (R26.89);Muscle weakness (generalized) (M62.81)     Time: 8295-6213 PT Time Calculation (min) (ACUTE ONLY): 26 min  Charges:  $Gait Training: 23-37 mins                    G Codes:       Conni Slipper, PT, DPT Acute Rehabilitation Services Pager: [phone]    Marylynn Pearson 05/11/2017, 10:32 AM

## 2017-05-11 NOTE — Progress Notes (Addendum)
the brain and surrounding structures were obtained without intravenous contrast. COMPARISON:  Head CT 05/05/2017 and MRI 07/08/2013 FINDINGS: Brain: There is no evidence of acute infarct, intracranial hemorrhage, mass, midline shift, or extra-axial fluid collection. There is mild to moderate cerebral and cerebellar atrophy. The mesial temporal lobe structures are symmetric without focal signal abnormality. Small foci of T2 hyperintensity in the cerebral white matter are unchanged from the prior MRI and nonspecific but compatible with mild chronic small vessel ischemic disease. Vascular: Major intracranial vascular flow voids are preserved. Skull and upper cervical spine: Unremarkable bone marrow signal. Sinuses/Orbits: Bilateral cataract extraction. Mild bilateral ethmoid air cell mucosal thickening. No significant mastoid fluid. Other: None. IMPRESSION: 1. No acute intracranial abnormality. 2. Mild chronic small vessel ischemic disease. 3. 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 Electronically Signed   By: Logan Bores M.D.   On: 05/09/2017 09:08   Dg Chest Port 1 View  Result Date: 05/07/2017 CLINICAL DATA:  Acute respiratory failure EXAM: PORTABLE CHEST 1 VIEW COMPARISON:  05/05/2017 FINDINGS: Cardiac shadow is stable. Aortic  calcifications are seen. Postoperative changes are noted. The lungs are well aerated bilaterally. No focal infiltrate is noted. No bony abnormality is seen. IMPRESSION: No active disease. Electronically Signed   By: Inez Catalina M.D.   On: 05/07/2017 07:50   Dg Chest Port 1 View  Result Date: 05/05/2017 CLINICAL DATA:  71 year old male with altered mental status, intubated. EXAM: PORTABLE CHEST 1 VIEW COMPARISON:  0500 hours today and earlier. FINDINGS: Portable AP semi upright view at 1057 hours. Endotracheal tube tip at the level the clavicles in good position. Normal cardiac size and mediastinal contours. Allowing for portable technique the lungs are clear. Improved lung volumes. Stable cholecystectomy clips. Partially visible thoracolumbar posterior spinal hardware. IMPRESSION: 1. Endotracheal tube tip in good position at the level the clavicles. 2.  No acute cardiopulmonary abnormality. Electronically Signed   By: Genevie Ann M.D.   On: 05/05/2017 11:12   Dg Abd Portable 1v  Result Date: 05/06/2017 CLINICAL DATA:  OG tube placement. EXAM: PORTABLE ABDOMEN - 1 VIEW COMPARISON:  None. FINDINGS: OG tube tip is in the region of the distal stomach. Bowel gas pattern is normal. IMPRESSION: OG tube tip in the region of the distal stomach. Electronically Signed   By: Lorriane Shire M.D.   On: 05/06/2017 13:13   Dg Swallowing Func-speech Pathology  Result Date: 05/08/2017 Objective Swallowing Evaluation: Type of Study: MBS-Modified Barium Swallow Study Patient Details Name: Cory Pacheco MRN: [id number] Date of Birth: August 22, 1946 Today's Date: 05/08/2017 Time: SLP Start Time (ACUTE ONLY): 1301-SLP Stop Time (ACUTE ONLY): 1312 SLP Time Calculation (min) (ACUTE ONLY): 11 min Past Medical History: Past Medical History: Diagnosis Date . Arthritis  . Depression  . Hypertension  Past Surgical History: Past Surgical History: Procedure Laterality Date . CHOLECYSTECTOMY   . COLONOSCOPY   . COLONOSCOPY N/A 08/03/2013   Procedure: COLONOSCOPY;  Surgeon: Rogene Houston, MD;  [facility];  Service: Endoscopy;  Laterality: N/A;  109 . TONSILLECTOMY   HPI: Cory Pacheco an 71 y.o.malewho has a seizure history and is followed by the [facility] hospital. 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 5 years ago he suffered a fall in which she started to have seizures 3 months later. He is continuing to have poorly controlled seizure activity. e did suffer a grand mal seizure the last for about 1 minute and then was having decreased airway which at that point he was intubated, 05/05/17  PROGRESS NOTE                                                                                                                                                                                                             Patient Demographics:    Cory Pacheco, is a 71 y.o. 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 Presented 5/22 with one week confusion following a seizure. Had seizure again in ED. Intubated after prolonged postictal period and transferred to [facility], seen by neurology, seizure has been controlled, successfully extubated.   Subjective:    [name] today Denies any complaints, no chest pain, no shortness of breath, no nausea or vomiting , patient is more confused per wife at bedside .   Assessment  & Plan :    Active Problems:   Seizure ([place])   Status epilepticus ([place])   Acute respiratory failure (HCC)   Pressure injury of skin   Encounter for orogastric (OG) tube placement  Encephalopathy - Secondary to seizures/postictal seizures, - for  modified barium swallow today, - MRI brain with no acute findings. - Patient with fluctuating mental status during hospital stay, and this is most likely due to delirium, today appears to be more confused, I told his wife insisted expected during hospital stay.  Seizures - Management per neurology, continuous video EEG 5/24, suggestive of severe encephalopathy, with no epileptiform discharges or seizures were present. - On Keppra and valproic acid, Keppra dose has been decreased to 500 mg twice a day  Respiratory failure - intubated  for airway protection, successfully extubated, back to baseline - Resolved  Hypertension - Blood pressure acceptable on amlodipine , will  resume on amlodipine, continue to hold hydrochlorothiazide and lisinopril  Hyperlipidemia - Resume statin  GERD - Continue with PPI  Code Status : Full  Family Communication  : wife at bedside  Disposition Plan  : Need SNF placement, awaiting PASSAR, won't be  available until tomorrow  Consults  :  PCCM, neurology  Procedures  : ETT, continuous EEG  DVT Prophylaxis  :   Heparin - SCDs  the brain and surrounding structures were obtained without intravenous contrast. COMPARISON:  Head CT 05/05/2017 and MRI 07/08/2013 FINDINGS: Brain: There is no evidence of acute infarct, intracranial hemorrhage, mass, midline shift, or extra-axial fluid collection. There is mild to moderate cerebral and cerebellar atrophy. The mesial temporal lobe structures are symmetric without focal signal abnormality. Small foci of T2 hyperintensity in the cerebral white matter are unchanged from the prior MRI and nonspecific but compatible with mild chronic small vessel ischemic disease. Vascular: Major intracranial vascular flow voids are preserved. Skull and upper cervical spine: Unremarkable bone marrow signal. Sinuses/Orbits: Bilateral cataract extraction. Mild bilateral ethmoid air cell mucosal thickening. No significant mastoid fluid. Other: None. IMPRESSION: 1. No acute intracranial abnormality. 2. Mild chronic small vessel ischemic disease. 3. 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 Electronically Signed   By: Logan Bores M.D.   On: 05/09/2017 09:08   Dg Chest Port 1 View  Result Date: 05/07/2017 CLINICAL DATA:  Acute respiratory failure EXAM: PORTABLE CHEST 1 VIEW COMPARISON:  05/05/2017 FINDINGS: Cardiac shadow is stable. Aortic  calcifications are seen. Postoperative changes are noted. The lungs are well aerated bilaterally. No focal infiltrate is noted. No bony abnormality is seen. IMPRESSION: No active disease. Electronically Signed   By: Inez Catalina M.D.   On: 05/07/2017 07:50   Dg Chest Port 1 View  Result Date: 05/05/2017 CLINICAL DATA:  71 year old male with altered mental status, intubated. EXAM: PORTABLE CHEST 1 VIEW COMPARISON:  0500 hours today and earlier. FINDINGS: Portable AP semi upright view at 1057 hours. Endotracheal tube tip at the level the clavicles in good position. Normal cardiac size and mediastinal contours. Allowing for portable technique the lungs are clear. Improved lung volumes. Stable cholecystectomy clips. Partially visible thoracolumbar posterior spinal hardware. IMPRESSION: 1. Endotracheal tube tip in good position at the level the clavicles. 2.  No acute cardiopulmonary abnormality. Electronically Signed   By: Genevie Ann M.D.   On: 05/05/2017 11:12   Dg Abd Portable 1v  Result Date: 05/06/2017 CLINICAL DATA:  OG tube placement. EXAM: PORTABLE ABDOMEN - 1 VIEW COMPARISON:  None. FINDINGS: OG tube tip is in the region of the distal stomach. Bowel gas pattern is normal. IMPRESSION: OG tube tip in the region of the distal stomach. Electronically Signed   By: Lorriane Shire M.D.   On: 05/06/2017 13:13   Dg Swallowing Func-speech Pathology  Result Date: 05/08/2017 Objective Swallowing Evaluation: Type of Study: MBS-Modified Barium Swallow Study Patient Details Name: Cory Pacheco MRN: [id number] Date of Birth: August 22, 1946 Today's Date: 05/08/2017 Time: SLP Start Time (ACUTE ONLY): 1301-SLP Stop Time (ACUTE ONLY): 1312 SLP Time Calculation (min) (ACUTE ONLY): 11 min Past Medical History: Past Medical History: Diagnosis Date . Arthritis  . Depression  . Hypertension  Past Surgical History: Past Surgical History: Procedure Laterality Date . CHOLECYSTECTOMY   . COLONOSCOPY   . COLONOSCOPY N/A 08/03/2013   Procedure: COLONOSCOPY;  Surgeon: Rogene Houston, MD;  [facility]: [facility];  Service: Endoscopy;  Laterality: N/A;  109 . TONSILLECTOMY   HPI: Cory Pacheco an 71 y.o.malewho has a seizure history and is followed by the [facility] hospital. 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 5 years ago he suffered a fall in which she started to have seizures 3 months later. He is continuing to have poorly controlled seizure activity. e did suffer a grand mal seizure the last for about 1 minute and then was having decreased airway which at that point he was intubated, 05/05/17  PROGRESS NOTE                                                                                                                                                                                                             Patient Demographics:    Cory Pacheco, is a 71 y.o. 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 Presented 5/22 with one week confusion following a seizure. Had seizure again in ED. Intubated after prolonged postictal period and transferred to [facility], seen by neurology, seizure has been controlled, successfully extubated.   Subjective:    [name] today Denies any complaints, no chest pain, no shortness of breath, no nausea or vomiting , patient is more confused per wife at bedside .   Assessment  & Plan :    Active Problems:   Seizure ([place])   Status epilepticus ([place])   Acute respiratory failure (HCC)   Pressure injury of skin   Encounter for orogastric (OG) tube placement  Encephalopathy - Secondary to seizures/postictal seizures, - for  modified barium swallow today, - MRI brain with no acute findings. - Patient with fluctuating mental status during hospital stay, and this is most likely due to delirium, today appears to be more confused, I told his wife insisted expected during hospital stay.  Seizures - Management per neurology, continuous video EEG 5/24, suggestive of severe encephalopathy, with no epileptiform discharges or seizures were present. - On Keppra and valproic acid, Keppra dose has been decreased to 500 mg twice a day  Respiratory failure - intubated  for airway protection, successfully extubated, back to baseline - Resolved  Hypertension - Blood pressure acceptable on amlodipine , will  resume on amlodipine, continue to hold hydrochlorothiazide and lisinopril  Hyperlipidemia - Resume statin  GERD - Continue with PPI  Code Status : Full  Family Communication  : wife at bedside  Disposition Plan  : Need SNF placement, awaiting PASSAR, won't be  available until tomorrow  Consults  :  PCCM, neurology  Procedures  : ETT, continuous EEG  DVT Prophylaxis  :   Heparin - SCDs  PROGRESS NOTE                                                                                                                                                                                                             Patient Demographics:    Cory Pacheco, is a 71 y.o. 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 Presented 5/22 with one week confusion following a seizure. Had seizure again in ED. Intubated after prolonged postictal period and transferred to [facility], seen by neurology, seizure has been controlled, successfully extubated.   Subjective:    [name] today Denies any complaints, no chest pain, no shortness of breath, no nausea or vomiting , patient is more confused per wife at bedside .   Assessment  & Plan :    Active Problems:   Seizure ([place])   Status epilepticus ([place])   Acute respiratory failure (HCC)   Pressure injury of skin   Encounter for orogastric (OG) tube placement  Encephalopathy - Secondary to seizures/postictal seizures, - for  modified barium swallow today, - MRI brain with no acute findings. - Patient with fluctuating mental status during hospital stay, and this is most likely due to delirium, today appears to be more confused, I told his wife insisted expected during hospital stay.  Seizures - Management per neurology, continuous video EEG 5/24, suggestive of severe encephalopathy, with no epileptiform discharges or seizures were present. - On Keppra and valproic acid, Keppra dose has been decreased to 500 mg twice a day  Respiratory failure - intubated  for airway protection, successfully extubated, back to baseline - Resolved  Hypertension - Blood pressure acceptable on amlodipine , will  resume on amlodipine, continue to hold hydrochlorothiazide and lisinopril  Hyperlipidemia - Resume statin  GERD - Continue with PPI  Code Status : Full  Family Communication  : wife at bedside  Disposition Plan  : Need SNF placement, awaiting PASSAR, won't be  available until tomorrow  Consults  :  PCCM, neurology  Procedures  : ETT, continuous EEG  DVT Prophylaxis  :   Heparin - SCDs  the brain and surrounding structures were obtained without intravenous contrast. COMPARISON:  Head CT 05/05/2017 and MRI 07/08/2013 FINDINGS: Brain: There is no evidence of acute infarct, intracranial hemorrhage, mass, midline shift, or extra-axial fluid collection. There is mild to moderate cerebral and cerebellar atrophy. The mesial temporal lobe structures are symmetric without focal signal abnormality. Small foci of T2 hyperintensity in the cerebral white matter are unchanged from the prior MRI and nonspecific but compatible with mild chronic small vessel ischemic disease. Vascular: Major intracranial vascular flow voids are preserved. Skull and upper cervical spine: Unremarkable bone marrow signal. Sinuses/Orbits: Bilateral cataract extraction. Mild bilateral ethmoid air cell mucosal thickening. No significant mastoid fluid. Other: None. IMPRESSION: 1. No acute intracranial abnormality. 2. Mild chronic small vessel ischemic disease. 3. 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 Electronically Signed   By: Logan Bores M.D.   On: 05/09/2017 09:08   Dg Chest Port 1 View  Result Date: 05/07/2017 CLINICAL DATA:  Acute respiratory failure EXAM: PORTABLE CHEST 1 VIEW COMPARISON:  05/05/2017 FINDINGS: Cardiac shadow is stable. Aortic  calcifications are seen. Postoperative changes are noted. The lungs are well aerated bilaterally. No focal infiltrate is noted. No bony abnormality is seen. IMPRESSION: No active disease. Electronically Signed   By: Inez Catalina M.D.   On: 05/07/2017 07:50   Dg Chest Port 1 View  Result Date: 05/05/2017 CLINICAL DATA:  71 year old male with altered mental status, intubated. EXAM: PORTABLE CHEST 1 VIEW COMPARISON:  0500 hours today and earlier. FINDINGS: Portable AP semi upright view at 1057 hours. Endotracheal tube tip at the level the clavicles in good position. Normal cardiac size and mediastinal contours. Allowing for portable technique the lungs are clear. Improved lung volumes. Stable cholecystectomy clips. Partially visible thoracolumbar posterior spinal hardware. IMPRESSION: 1. Endotracheal tube tip in good position at the level the clavicles. 2.  No acute cardiopulmonary abnormality. Electronically Signed   By: Genevie Ann M.D.   On: 05/05/2017 11:12   Dg Abd Portable 1v  Result Date: 05/06/2017 CLINICAL DATA:  OG tube placement. EXAM: PORTABLE ABDOMEN - 1 VIEW COMPARISON:  None. FINDINGS: OG tube tip is in the region of the distal stomach. Bowel gas pattern is normal. IMPRESSION: OG tube tip in the region of the distal stomach. Electronically Signed   By: Lorriane Shire M.D.   On: 05/06/2017 13:13   Dg Swallowing Func-speech Pathology  Result Date: 05/08/2017 Objective Swallowing Evaluation: Type of Study: MBS-Modified Barium Swallow Study Patient Details Name: Cory Pacheco MRN: [id number] Date of Birth: August 22, 1946 Today's Date: 05/08/2017 Time: SLP Start Time (ACUTE ONLY): 1301-SLP Stop Time (ACUTE ONLY): 1312 SLP Time Calculation (min) (ACUTE ONLY): 11 min Past Medical History: Past Medical History: Diagnosis Date . Arthritis  . Depression  . Hypertension  Past Surgical History: Past Surgical History: Procedure Laterality Date . CHOLECYSTECTOMY   . COLONOSCOPY   . COLONOSCOPY N/A 08/03/2013   Procedure: COLONOSCOPY;  Surgeon: Rogene Houston, MD;  [facility]: [facility];  Service: Endoscopy;  Laterality: N/A;  109 . TONSILLECTOMY   HPI: Cory Pacheco an 71 y.o.malewho has a seizure history and is followed by the [facility] hospital. 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 5 years ago he suffered a fall in which she started to have seizures 3 months later. He is continuing to have poorly controlled seizure activity. e did suffer a grand mal seizure the last for about 1 minute and then was having decreased airway which at that point he was intubated, 05/05/17

## 2017-05-11 NOTE — [place] FL2 (Signed)
[place] MEDICAID FL2 LEVEL OF CARE SCREENING TOOL     IDENTIFICATION  Patient Name: Cory Pacheco Birthdate: 04/21/1946 Sex: male Admission Date (Current Location): 05/05/2017  [facility] and [place] Number:  Producer, television/film/video and Address:  [facility], [address] [place], [place] [address]      Provider Number: [id number]  Attending Physician Name and Address:  Elgergawy, Leana Roe, MD  Relative Name and Phone Number:       Current Level of Care: Hospital Recommended Level of Care: Skilled Nursing Facility Prior Approval Number:    Date Approved/Denied:   PASRR Number: [id number]  Discharge Plan: SNF    Current Diagnoses: Patient Active Problem List   Diagnosis Date Noted  . Pressure injury of skin 05/06/2017  . Encounter for orogastric (OG) tube placement   . Seizure (HCC) 05/05/2017  . Status epilepticus (HCC)   . Acute respiratory failure (HCC)     Orientation RESPIRATION BLADDER Height & Weight     Self  Normal Continent Weight: 235 lb 3.2 oz (106.7 kg) Height:  6\' 2"  (188 cm)  BEHAVIORAL SYMPTOMS/MOOD NEUROLOGICAL BOWEL NUTRITION STATUS    Convulsions/Seizures Continent Diet (DYS 1 diet; Fluid consistency: Honey Thick)  AMBULATORY STATUS COMMUNICATION OF NEEDS Skin   Limited Assist Verbally PU Stage and Appropriate Care (Location: sacrum) PU Stage 1 Dressing: Daily (Foam dressing changed every 3 days)                     Personal Care Assistance Level of Assistance  Bathing, Feeding, Dressing Bathing Assistance: Limited assistance Feeding assistance: Limited assistance Dressing Assistance: Limited assistance     Functional Limitations Info  Sight, Hearing, Speech Sight Info: Adequate Hearing Info: Adequate Speech Info: Adequate    SPECIAL CARE FACTORS FREQUENCY  PT (By licensed PT), OT (By licensed OT)     PT Frequency: 5x OT Frequency: 5x            Contractures Contractures Info: Not present     Additional Factors Info  Code Status, Allergies, Psychotropic Code Status Info: Full Allergies Info: Bupropion, Metformin, Sulfa Antibiotics Psychotropic Info: See med list         Current Medications (05/11/2017):  This is the current hospital active medication list Current Facility-Administered Medications  Medication Dose Route Frequency Provider Last Rate Last Dose  . 0.9 %  sodium chloride infusion   Intravenous Continuous Nelda Bucks, MD 10 mL/hr at 05/08/17 0200    . acetaminophen (TYLENOL) tablet 650 mg  650 mg Oral Q4H PRN Duayne Cal, NP      . ALPRAZolam Prudy Feeler) tablet 0.25 mg  0.25 mg Oral BID PRN Elgergawy, Leana Roe, MD      . amLODipine (NORVASC) tablet 2.5 mg  2.5 mg Oral Daily Elgergawy, Leana Roe, MD   2.5 mg at 05/11/17 1006  . aspirin EC tablet 81 mg  81 mg Oral Daily Elgergawy, Leana Roe, MD   81 mg at 05/11/17 1005  . cholecalciferol (VITAMIN D) tablet 400 Units  400 Units Oral Daily Elgergawy, Leana Roe, MD   400 Units at 05/11/17 1001  . folic acid (FOLVITE) tablet 2 mg  2 mg Oral Daily Elgergawy, Leana Roe, MD   2 mg at 05/11/17 1004  . heparin injection 5,000 Units  5,000 Units Subcutaneous Q8H Duayne Cal, NP   5,000 Units at 05/11/17 0456  . levETIRAcetam (KEPPRA) 500 mg in sodium chloride 0.9 % 100  mL IVPB  500 mg Intravenous Q12H Rejeana Brock, MD   Stopped at 05/11/17 0421  . LORazepam (ATIVAN) injection 2 mg  2 mg Intravenous PRN Coralyn Helling, MD   2 mg at 05/06/17 2345  . MEDLINE mouth rinse  15 mL Mouth Rinse q12n4p Nelda Bucks, MD   15 mL at 05/10/17 1600  . multivitamin liquid 15 mL  15 mL Per Tube Daily Nelda Bucks, MD   15 mL at 05/11/17 0955  . pantoprazole (PROTONIX) EC tablet 40 mg  40 mg Oral Daily Elgergawy, Leana Roe, MD   40 mg at 05/11/17 1005  . potassium chloride SA (K-DUR,KLOR-CON) CR tablet 20 mEq  20 mEq Oral Daily Elgergawy, Leana Roe, MD   20 mEq at 05/11/17 1006  . pravastatin (PRAVACHOL) tablet 40  mg  40 mg Oral QHS Elgergawy, Leana Roe, MD   40 mg at 05/10/17 2130  . RESOURCE THICKENUP CLEAR   Oral PRN Elgergawy, Leana Roe, MD      . sertraline (ZOLOFT) tablet 75 mg  75 mg Oral Daily Elgergawy, Leana Roe, MD   75 mg at 05/11/17 1002  . valproate (DEPACON) 500 mg in dextrose 5 % 50 mL IVPB  500 mg Intravenous Q8H Rejeana Brock, MD   Stopped at 05/11/17 [id number]     Discharge Medications: Please see discharge summary for a list of discharge medications.  Relevant Imaging Results:  Relevant Lab Results:   Additional Information SSN: [id number]  Baldemar Lenis, LCSW

## 2017-05-12 ENCOUNTER — Inpatient Hospital Stay (HOSPITAL_COMMUNITY): Payer: PPO

## 2017-05-12 DIAGNOSIS — J9600 Acute respiratory failure, unspecified whether with hypoxia or hypercapnia: Secondary | ICD-10-CM | POA: Diagnosis not present

## 2017-05-12 DIAGNOSIS — R1312 Dysphagia, oropharyngeal phase: Secondary | ICD-10-CM | POA: Diagnosis not present

## 2017-05-12 DIAGNOSIS — F319 Bipolar disorder, unspecified: Secondary | ICD-10-CM | POA: Diagnosis not present

## 2017-05-12 DIAGNOSIS — F419 Anxiety disorder, unspecified: Secondary | ICD-10-CM | POA: Diagnosis not present

## 2017-05-12 DIAGNOSIS — R569 Unspecified convulsions: Secondary | ICD-10-CM | POA: Diagnosis not present

## 2017-05-12 DIAGNOSIS — M6281 Muscle weakness (generalized): Secondary | ICD-10-CM | POA: Diagnosis not present

## 2017-05-12 DIAGNOSIS — G4700 Insomnia, unspecified: Secondary | ICD-10-CM | POA: Diagnosis not present

## 2017-05-12 DIAGNOSIS — R41841 Cognitive communication deficit: Secondary | ICD-10-CM | POA: Diagnosis not present

## 2017-05-12 DIAGNOSIS — R2681 Unsteadiness on feet: Secondary | ICD-10-CM | POA: Diagnosis not present

## 2017-05-12 DIAGNOSIS — E119 Type 2 diabetes mellitus without complications: Secondary | ICD-10-CM | POA: Diagnosis not present

## 2017-05-12 DIAGNOSIS — M1990 Unspecified osteoarthritis, unspecified site: Secondary | ICD-10-CM | POA: Diagnosis not present

## 2017-05-12 DIAGNOSIS — E785 Hyperlipidemia, unspecified: Secondary | ICD-10-CM | POA: Diagnosis not present

## 2017-05-12 DIAGNOSIS — K219 Gastro-esophageal reflux disease without esophagitis: Secondary | ICD-10-CM | POA: Diagnosis not present

## 2017-05-12 DIAGNOSIS — G9340 Encephalopathy, unspecified: Secondary | ICD-10-CM | POA: Diagnosis not present

## 2017-05-12 DIAGNOSIS — I10 Essential (primary) hypertension: Secondary | ICD-10-CM | POA: Diagnosis not present

## 2017-05-12 DIAGNOSIS — G40901 Epilepsy, unspecified, not intractable, with status epilepticus: Secondary | ICD-10-CM | POA: Diagnosis not present

## 2017-05-12 MED ORDER — LEVETIRACETAM 500 MG PO TABS: 500.0000 mg | ORAL_TABLET | Freq: Two times a day (BID) | ORAL | Status: DC

## 2017-05-12 MED ORDER — DIVALPROEX SODIUM 500 MG PO DR TAB: 500.0000 mg | DELAYED_RELEASE_TABLET | Freq: Three times a day (TID) | ORAL | Status: DC

## 2017-05-12 MED ORDER — ACETAMINOPHEN 325 MG PO TABS: 650.0000 mg | ORAL_TABLET | Freq: Four times a day (QID) | ORAL | Status: DC | PRN

## 2017-05-12 MED ORDER — DIVALPROEX SODIUM 500 MG PO DR TAB
500.0000 mg | DELAYED_RELEASE_TABLET | Freq: Three times a day (TID) | ORAL | Status: DC
  Administered 2017-05-12: 500 mg via ORAL
  Filled 2017-05-12: qty 1

## 2017-05-12 MED ORDER — ALPRAZOLAM 0.25 MG PO TABS: 0.2500 mg | ORAL_TABLET | Freq: Two times a day (BID) | ORAL | 0 refills | Status: DC

## 2017-05-12 MED ORDER — ADULT MULTIVITAMIN LIQUID CH: 15.0000 mL | Freq: Every day | ORAL | Status: DC

## 2017-05-12 NOTE — Progress Notes (Signed)
Patient will discharge to [facility] Anticipated discharge date: 5/29 Family notified: pt wife Transportation by wife Report [phone] Josem Kaufmann #: [id number]   [place] signing off.  Jorge Ny, LCSW Clinical Social Worker [phone]

## 2017-05-12 NOTE — Progress Notes (Signed)
Pt discharged to [facility]. Report called in. Wife is providing transportation.

## 2017-05-12 NOTE — Consult Note (Signed)
[facility] CM Primary Care Navigator  05/12/2017  Cory Pacheco 1946-04-12 [id number]    Met with patient and wife Cory Pacheco) at the bedside to identify possible discharge needs. Wife reports that patient had seizures which had led to this admission. Patient's wife endorses Dr.Terry Reuel Boom with [facility] as theprimary care provider.She also states that patient is being seen by a [place] doctor but unable to remember the name.   [facility] [place] to [place] medications without difficulty.   Patient's wife manageshis medications at home using "pill box" system filled every 2 weeks.  Wife has been providingtransportation to hisdoctors' appointments.  Patient's wife is Theatre manager home.Son Cory Pacheco) who lives next door can also provide assistance if needed per wife.  Anticipated discharge plan is [facility] for short term rehabilitation prior to going back home per wife.  Patient/ wife voiced understanding to call primary care provider's office when he returns back home, for a post discharge follow-up appointment within a week or sooner if needs arise.Patient letter (with PCP's contact number) was provided as theirreminder.  Wife communicated no otherhealth management needs or concerns at this time. [facility] care management contact information provided for future needs that he may have.   For questions, please contact:  Wyatt Haste, BSN, RN- [facility] Primary Care Navigator  Telephone: [phone] [facility]

## 2017-05-12 NOTE — Clinical Social Work Placement (Signed)
CLINICAL SOCIAL WORK PLACEMENT  NOTE  Date:  05/12/2017  Patient Details  Name: Cory Pacheco MRN: [id number] Date of Birth: 1945/12/23  Clinical Social Work is seeking post-discharge placement for this patient at the Skilled  Nursing Facility level of care (*CSW will initial, date and re-position this form in  chart as items are completed):  Yes   Patient/family provided with [facility] Clinical Social Work Department's list of facilities offering this level of care within the geographic area requested by the patient (or if unable, by the patient's family).  Yes   Patient/family informed of their freedom to choose among providers that offer the needed level of care, that participate in Medicare, Medicaid or managed care program needed by the patient, have an available bed and are willing to accept the patient.  Yes   Patient/family informed of [facility]'s ownership interest in [facility] and [facility], as well as of the fact that they are under no obligation to receive care at these facilities.  PASRR submitted to EDS on 05/10/17     PASRR number received on       Existing PASRR number confirmed on       FL2 transmitted to all facilities in geographic area requested by pt/family on 05/10/17     FL2 transmitted to all facilities within larger geographic area on       Patient informed that his/her managed care company has contracts with or will negotiate with certain facilities, including the following:        Yes   Patient/family informed of bed offers received.  Patient chooses bed at [facility]     Physician recommends and patient chooses bed at      Patient to be transferred to [facility] on 05/12/17.  Patient to be transferred to facility by       Patient family notified on 05/12/17 of transfer.  Name of family member notified:  wife     PHYSICIAN Please sign FL2     Additional Comment:     _______________________________________________ Burna Sis, LCSW 05/12/2017, 3:21 PM

## 2017-05-12 NOTE — Care Management Important Message (Signed)
Important Message  Patient Details  Name: Cory Pacheco MRN: [id number] Date of Birth: 10-Jul-1946   Medicare Important Message Given:  Yes    Charnette Younkin [address] 05/12/2017, 1:52 PM

## 2017-05-12 NOTE — Discharge Summary (Addendum)
are clear. Improved lung volumes. Stable cholecystectomy clips. Partially visible thoracolumbar posterior  spinal hardware. IMPRESSION: 1. Endotracheal tube tip in good position at the level the clavicles. 2.  No acute cardiopulmonary abnormality. Electronically Signed   By: Genevie Ann M.D.   On: 05/05/2017 11:12   Dg Abd Portable 1v  Result Date: 05/06/2017 CLINICAL DATA:  OG tube placement. EXAM: PORTABLE ABDOMEN - 1 VIEW COMPARISON:  None. FINDINGS: OG tube tip is in the region of the distal stomach. Bowel gas pattern is normal. IMPRESSION: OG tube tip in the region of the distal stomach. Electronically Signed   By: Lorriane Shire M.D.   On: 05/06/2017 13:13   Dg Swallowing Func-speech Pathology  Result Date: 05/08/2017 Objective Swallowing Evaluation: Type of Study: MBS-Modified Barium Swallow Study Patient Details Name: Cory Pacheco MRN: [id number] Date of Birth: [birth year] Today's Date: 05/08/2017 Time: SLP Start Time (ACUTE ONLY): 1301-SLP Stop Time (ACUTE ONLY): 1312 SLP Time Calculation (min) (ACUTE ONLY): 11 min Past Medical History: Past Medical History: Diagnosis Date . Arthritis  . Depression  . Hypertension  Past Surgical History: Past Surgical History: Procedure Laterality Date . CHOLECYSTECTOMY   . COLONOSCOPY   . COLONOSCOPY N/A 08/03/2013  Procedure: COLONOSCOPY;  Surgeon: Rogene Houston, MD;  [facility];  Service: Endoscopy;  Laterality: N/A;  41 . TONSILLECTOMY   HPI: Cory Haegele Crownoveris an 71 y.o.malewho has a seizure history and is followed by the [facility] hospital. 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 5 years ago he suffered a fall in which she started to have seizures 3 months later. He is continuing to have poorly controlled seizure activity. e did suffer a grand mal seizure the last for about 1 minute and then was having decreased airway which at that point he was intubated, 05/05/17 with extubation 05/06/17. Chest x ray without active disease.   Subjective: pt alert, requires cueing Assessment / Plan / Recommendation CHL IP CLINICAL IMPRESSIONS 05/08/2017 Clinical  Impression Pt has a moderate oropharyngeal dysphagia, with oral phase largely impacted by mentation. He has reduced bolus cohesion and slow transit of all consistencies, and he consumes liquids at a rapid rate of intake despite cues from SLP to attempt smaller, single sips. Barium spills into the pharynx with thickened liquids pooling all the way in the pyriform sinuses in large volumes before he initiates a swallow. Pt has reduced hyolaryngeal movement, epiglottic deflection, and airway closure, causing deep, silent penetration and intermittent aspiration with nectar thick liquids. His cued cough is not effective at clearing laryngeal penetrates. He has similar penetration with soft solid boluses, but when he consumes purees and honey thick liquids he has only intermittent, shallow penetration that he can more easily clear. Pt has moderate-severe vallecular residue post-swallow, but this is reduced with cues for a second swallow. Recommend downgrade to Dys 1 diet and honey thick liquids with use of a second swallow and full supervision to monitor for small, single boluses. SLP Visit Diagnosis Dysphagia, oropharyngeal phase (R13.12) Attention and concentration deficit following -- Frontal lobe and executive function deficit following -- Impact on safety and function Moderate aspiration risk   CHL IP TREATMENT RECOMMENDATION 05/08/2017 Treatment Recommendations Therapy as outlined in treatment plan below   Prognosis 05/08/2017 Prognosis for Safe Diet Advancement Good Barriers to Reach Goals Cognitive deficits Barriers/Prognosis Comment -- CHL IP DIET RECOMMENDATION 05/08/2017 SLP Diet Recommendations Dysphagia 1 (Puree) solids;Honey thick liquids Liquid Administration via Cup;No straw Medication Administration Crushed with puree Compensations Minimize environmental  are clear. Improved lung volumes. Stable cholecystectomy clips. Partially visible thoracolumbar posterior  spinal hardware. IMPRESSION: 1. Endotracheal tube tip in good position at the level the clavicles. 2.  No acute cardiopulmonary abnormality. Electronically Signed   By: Genevie Ann M.D.   On: 05/05/2017 11:12   Dg Abd Portable 1v  Result Date: 05/06/2017 CLINICAL DATA:  OG tube placement. EXAM: PORTABLE ABDOMEN - 1 VIEW COMPARISON:  None. FINDINGS: OG tube tip is in the region of the distal stomach. Bowel gas pattern is normal. IMPRESSION: OG tube tip in the region of the distal stomach. Electronically Signed   By: Lorriane Shire M.D.   On: 05/06/2017 13:13   Dg Swallowing Func-speech Pathology  Result Date: 05/08/2017 Objective Swallowing Evaluation: Type of Study: MBS-Modified Barium Swallow Study Patient Details Name: Cory Pacheco MRN: [id number] Date of Birth: [birth year] Today's Date: 05/08/2017 Time: SLP Start Time (ACUTE ONLY): 1301-SLP Stop Time (ACUTE ONLY): 1312 SLP Time Calculation (min) (ACUTE ONLY): 11 min Past Medical History: Past Medical History: Diagnosis Date . Arthritis  . Depression  . Hypertension  Past Surgical History: Past Surgical History: Procedure Laterality Date . CHOLECYSTECTOMY   . COLONOSCOPY   . COLONOSCOPY N/A 08/03/2013  Procedure: COLONOSCOPY;  Surgeon: Rogene Houston, MD;  [facility];  Service: Endoscopy;  Laterality: N/A;  41 . TONSILLECTOMY   HPI: Cory Haegele Crownoveris an 71 y.o.malewho has a seizure history and is followed by the [facility] hospital. 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 5 years ago he suffered a fall in which she started to have seizures 3 months later. He is continuing to have poorly controlled seizure activity. e did suffer a grand mal seizure the last for about 1 minute and then was having decreased airway which at that point he was intubated, 05/05/17 with extubation 05/06/17. Chest x ray without active disease.   Subjective: pt alert, requires cueing Assessment / Plan / Recommendation CHL IP CLINICAL IMPRESSIONS 05/08/2017 Clinical  Impression Pt has a moderate oropharyngeal dysphagia, with oral phase largely impacted by mentation. He has reduced bolus cohesion and slow transit of all consistencies, and he consumes liquids at a rapid rate of intake despite cues from SLP to attempt smaller, single sips. Barium spills into the pharynx with thickened liquids pooling all the way in the pyriform sinuses in large volumes before he initiates a swallow. Pt has reduced hyolaryngeal movement, epiglottic deflection, and airway closure, causing deep, silent penetration and intermittent aspiration with nectar thick liquids. His cued cough is not effective at clearing laryngeal penetrates. He has similar penetration with soft solid boluses, but when he consumes purees and honey thick liquids he has only intermittent, shallow penetration that he can more easily clear. Pt has moderate-severe vallecular residue post-swallow, but this is reduced with cues for a second swallow. Recommend downgrade to Dys 1 diet and honey thick liquids with use of a second swallow and full supervision to monitor for small, single boluses. SLP Visit Diagnosis Dysphagia, oropharyngeal phase (R13.12) Attention and concentration deficit following -- Frontal lobe and executive function deficit following -- Impact on safety and function Moderate aspiration risk   CHL IP TREATMENT RECOMMENDATION 05/08/2017 Treatment Recommendations Therapy as outlined in treatment plan below   Prognosis 05/08/2017 Prognosis for Safe Diet Advancement Good Barriers to Reach Goals Cognitive deficits Barriers/Prognosis Comment -- CHL IP DIET RECOMMENDATION 05/08/2017 SLP Diet Recommendations Dysphagia 1 (Puree) solids;Honey thick liquids Liquid Administration via Cup;No straw Medication Administration Crushed with puree Compensations Minimize environmental  KAZIM CORRALES, is a 71 y.o. male  DOB [birth year]  MRN [id number].  Admission date:  05/05/2017  Admitting Physician  Rigoberto Noel, MD  Discharge Date:  05/12/2017   Primary MD  Caryl Bis, MD  Recommendations for primary care physician for things to follow:  - please check CBC, BMP in 3 days from discharge - Patient will need to follow-up with neurology in 4 weeks, ambulatory referral has been sent to [facility] neurology of [facility]. - Continue SLP follow-up at facility, he is on dysphagia 2 diet with honey thick   Admission Diagnosis  STATUS EPILEPTICUS SEPSIS   Discharge Diagnosis  STATUS EPILEPTICUS SEPSIS    Active Problems:   Seizure ([place])   Status epilepticus ([place])   Acute respiratory failure ([facility])   Pressure injury of skin   Encounter for orogastric (OG) tube placement      Past Medical History:  Diagnosis Date  . Arthritis   . Depression   . Hypertension     Past Surgical History:  Procedure Laterality Date  . CHOLECYSTECTOMY    . COLONOSCOPY    . COLONOSCOPY N/A 08/03/2013   Procedure: COLONOSCOPY;  Surgeon: Rogene Houston, MD;  [facility];  Service: Endoscopy;  Laterality: N/A;  930  . 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 The patient is encephalopathic and therefore history is taken from chart review and the patient's wife. She is not the best historian. She reports that he has had "mental problems "ever since  having chemicals blood in his face while working at a [employer] many years ago. About 5 years ago he suffered a fall where he injured his head. Imaging at that time was negative. 3 months later he experienced his first seizure. Since then he has continued to have poorly controlled seizure activity. He has already had to encounter year 2018. Most recently he suffered a seizure on May 13. He was taken to the ER where workup was negative and he was discharged home. Since that time he has experienced intermittent alterations in mental status. His wife claims that he will "talk out of his head" and be very confused. This would happen briefly and then he would return to his usual state of health. She took him to his primary psychiatrist 5/21 who instructed them to utilize the when necessary risperidone. Then overnight in the early a.m. hours of 5/22 he awoke from sleep and was speaking gibberish. It was for that reason she took him to the [facility] emergency department. While in the emergency  are clear. Improved lung volumes. Stable cholecystectomy clips. Partially visible thoracolumbar posterior  spinal hardware. IMPRESSION: 1. Endotracheal tube tip in good position at the level the clavicles. 2.  No acute cardiopulmonary abnormality. Electronically Signed   By: Genevie Ann M.D.   On: 05/05/2017 11:12   Dg Abd Portable 1v  Result Date: 05/06/2017 CLINICAL DATA:  OG tube placement. EXAM: PORTABLE ABDOMEN - 1 VIEW COMPARISON:  None. FINDINGS: OG tube tip is in the region of the distal stomach. Bowel gas pattern is normal. IMPRESSION: OG tube tip in the region of the distal stomach. Electronically Signed   By: Lorriane Shire M.D.   On: 05/06/2017 13:13   Dg Swallowing Func-speech Pathology  Result Date: 05/08/2017 Objective Swallowing Evaluation: Type of Study: MBS-Modified Barium Swallow Study Patient Details Name: Cory Pacheco MRN: [id number] Date of Birth: [birth year] Today's Date: 05/08/2017 Time: SLP Start Time (ACUTE ONLY): 1301-SLP Stop Time (ACUTE ONLY): 1312 SLP Time Calculation (min) (ACUTE ONLY): 11 min Past Medical History: Past Medical History: Diagnosis Date . Arthritis  . Depression  . Hypertension  Past Surgical History: Past Surgical History: Procedure Laterality Date . CHOLECYSTECTOMY   . COLONOSCOPY   . COLONOSCOPY N/A 08/03/2013  Procedure: COLONOSCOPY;  Surgeon: Rogene Houston, MD;  [facility];  Service: Endoscopy;  Laterality: N/A;  41 . TONSILLECTOMY   HPI: Cory Haegele Crownoveris an 71 y.o.malewho has a seizure history and is followed by the [facility] hospital. 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 5 years ago he suffered a fall in which she started to have seizures 3 months later. He is continuing to have poorly controlled seizure activity. e did suffer a grand mal seizure the last for about 1 minute and then was having decreased airway which at that point he was intubated, 05/05/17 with extubation 05/06/17. Chest x ray without active disease.   Subjective: pt alert, requires cueing Assessment / Plan / Recommendation CHL IP CLINICAL IMPRESSIONS 05/08/2017 Clinical  Impression Pt has a moderate oropharyngeal dysphagia, with oral phase largely impacted by mentation. He has reduced bolus cohesion and slow transit of all consistencies, and he consumes liquids at a rapid rate of intake despite cues from SLP to attempt smaller, single sips. Barium spills into the pharynx with thickened liquids pooling all the way in the pyriform sinuses in large volumes before he initiates a swallow. Pt has reduced hyolaryngeal movement, epiglottic deflection, and airway closure, causing deep, silent penetration and intermittent aspiration with nectar thick liquids. His cued cough is not effective at clearing laryngeal penetrates. He has similar penetration with soft solid boluses, but when he consumes purees and honey thick liquids he has only intermittent, shallow penetration that he can more easily clear. Pt has moderate-severe vallecular residue post-swallow, but this is reduced with cues for a second swallow. Recommend downgrade to Dys 1 diet and honey thick liquids with use of a second swallow and full supervision to monitor for small, single boluses. SLP Visit Diagnosis Dysphagia, oropharyngeal phase (R13.12) Attention and concentration deficit following -- Frontal lobe and executive function deficit following -- Impact on safety and function Moderate aspiration risk   CHL IP TREATMENT RECOMMENDATION 05/08/2017 Treatment Recommendations Therapy as outlined in treatment plan below   Prognosis 05/08/2017 Prognosis for Safe Diet Advancement Good Barriers to Reach Goals Cognitive deficits Barriers/Prognosis Comment -- CHL IP DIET RECOMMENDATION 05/08/2017 SLP Diet Recommendations Dysphagia 1 (Puree) solids;Honey thick liquids Liquid Administration via Cup;No straw Medication Administration Crushed with puree Compensations Minimize environmental  are clear. Improved lung volumes. Stable cholecystectomy clips. Partially visible thoracolumbar posterior  spinal hardware. IMPRESSION: 1. Endotracheal tube tip in good position at the level the clavicles. 2.  No acute cardiopulmonary abnormality. Electronically Signed   By: Genevie Ann M.D.   On: 05/05/2017 11:12   Dg Abd Portable 1v  Result Date: 05/06/2017 CLINICAL DATA:  OG tube placement. EXAM: PORTABLE ABDOMEN - 1 VIEW COMPARISON:  None. FINDINGS: OG tube tip is in the region of the distal stomach. Bowel gas pattern is normal. IMPRESSION: OG tube tip in the region of the distal stomach. Electronically Signed   By: Lorriane Shire M.D.   On: 05/06/2017 13:13   Dg Swallowing Func-speech Pathology  Result Date: 05/08/2017 Objective Swallowing Evaluation: Type of Study: MBS-Modified Barium Swallow Study Patient Details Name: Cory Pacheco MRN: [id number] Date of Birth: [birth year] Today's Date: 05/08/2017 Time: SLP Start Time (ACUTE ONLY): 1301-SLP Stop Time (ACUTE ONLY): 1312 SLP Time Calculation (min) (ACUTE ONLY): 11 min Past Medical History: Past Medical History: Diagnosis Date . Arthritis  . Depression  . Hypertension  Past Surgical History: Past Surgical History: Procedure Laterality Date . CHOLECYSTECTOMY   . COLONOSCOPY   . COLONOSCOPY N/A 08/03/2013  Procedure: COLONOSCOPY;  Surgeon: Rogene Houston, MD;  [facility];  Service: Endoscopy;  Laterality: N/A;  41 . TONSILLECTOMY   HPI: Cory Haegele Crownoveris an 71 y.o.malewho has a seizure history and is followed by the [facility] hospital. 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 5 years ago he suffered a fall in which she started to have seizures 3 months later. He is continuing to have poorly controlled seizure activity. e did suffer a grand mal seizure the last for about 1 minute and then was having decreased airway which at that point he was intubated, 05/05/17 with extubation 05/06/17. Chest x ray without active disease.   Subjective: pt alert, requires cueing Assessment / Plan / Recommendation CHL IP CLINICAL IMPRESSIONS 05/08/2017 Clinical  Impression Pt has a moderate oropharyngeal dysphagia, with oral phase largely impacted by mentation. He has reduced bolus cohesion and slow transit of all consistencies, and he consumes liquids at a rapid rate of intake despite cues from SLP to attempt smaller, single sips. Barium spills into the pharynx with thickened liquids pooling all the way in the pyriform sinuses in large volumes before he initiates a swallow. Pt has reduced hyolaryngeal movement, epiglottic deflection, and airway closure, causing deep, silent penetration and intermittent aspiration with nectar thick liquids. His cued cough is not effective at clearing laryngeal penetrates. He has similar penetration with soft solid boluses, but when he consumes purees and honey thick liquids he has only intermittent, shallow penetration that he can more easily clear. Pt has moderate-severe vallecular residue post-swallow, but this is reduced with cues for a second swallow. Recommend downgrade to Dys 1 diet and honey thick liquids with use of a second swallow and full supervision to monitor for small, single boluses. SLP Visit Diagnosis Dysphagia, oropharyngeal phase (R13.12) Attention and concentration deficit following -- Frontal lobe and executive function deficit following -- Impact on safety and function Moderate aspiration risk   CHL IP TREATMENT RECOMMENDATION 05/08/2017 Treatment Recommendations Therapy as outlined in treatment plan below   Prognosis 05/08/2017 Prognosis for Safe Diet Advancement Good Barriers to Reach Goals Cognitive deficits Barriers/Prognosis Comment -- CHL IP DIET RECOMMENDATION 05/08/2017 SLP Diet Recommendations Dysphagia 1 (Puree) solids;Honey thick liquids Liquid Administration via Cup;No straw Medication Administration Crushed with puree Compensations Minimize environmental  are clear. Improved lung volumes. Stable cholecystectomy clips. Partially visible thoracolumbar posterior  spinal hardware. IMPRESSION: 1. Endotracheal tube tip in good position at the level the clavicles. 2.  No acute cardiopulmonary abnormality. Electronically Signed   By: Genevie Ann M.D.   On: 05/05/2017 11:12   Dg Abd Portable 1v  Result Date: 05/06/2017 CLINICAL DATA:  OG tube placement. EXAM: PORTABLE ABDOMEN - 1 VIEW COMPARISON:  None. FINDINGS: OG tube tip is in the region of the distal stomach. Bowel gas pattern is normal. IMPRESSION: OG tube tip in the region of the distal stomach. Electronically Signed   By: Lorriane Shire M.D.   On: 05/06/2017 13:13   Dg Swallowing Func-speech Pathology  Result Date: 05/08/2017 Objective Swallowing Evaluation: Type of Study: MBS-Modified Barium Swallow Study Patient Details Name: Cory Pacheco MRN: [id number] Date of Birth: [birth year] Today's Date: 05/08/2017 Time: SLP Start Time (ACUTE ONLY): 1301-SLP Stop Time (ACUTE ONLY): 1312 SLP Time Calculation (min) (ACUTE ONLY): 11 min Past Medical History: Past Medical History: Diagnosis Date . Arthritis  . Depression  . Hypertension  Past Surgical History: Past Surgical History: Procedure Laterality Date . CHOLECYSTECTOMY   . COLONOSCOPY   . COLONOSCOPY N/A 08/03/2013  Procedure: COLONOSCOPY;  Surgeon: Rogene Houston, MD;  [facility];  Service: Endoscopy;  Laterality: N/A;  41 . TONSILLECTOMY   HPI: Cory Haegele Crownoveris an 71 y.o.malewho has a seizure history and is followed by the [facility] hospital. 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 5 years ago he suffered a fall in which she started to have seizures 3 months later. He is continuing to have poorly controlled seizure activity. e did suffer a grand mal seizure the last for about 1 minute and then was having decreased airway which at that point he was intubated, 05/05/17 with extubation 05/06/17. Chest x ray without active disease.   Subjective: pt alert, requires cueing Assessment / Plan / Recommendation CHL IP CLINICAL IMPRESSIONS 05/08/2017 Clinical  Impression Pt has a moderate oropharyngeal dysphagia, with oral phase largely impacted by mentation. He has reduced bolus cohesion and slow transit of all consistencies, and he consumes liquids at a rapid rate of intake despite cues from SLP to attempt smaller, single sips. Barium spills into the pharynx with thickened liquids pooling all the way in the pyriform sinuses in large volumes before he initiates a swallow. Pt has reduced hyolaryngeal movement, epiglottic deflection, and airway closure, causing deep, silent penetration and intermittent aspiration with nectar thick liquids. His cued cough is not effective at clearing laryngeal penetrates. He has similar penetration with soft solid boluses, but when he consumes purees and honey thick liquids he has only intermittent, shallow penetration that he can more easily clear. Pt has moderate-severe vallecular residue post-swallow, but this is reduced with cues for a second swallow. Recommend downgrade to Dys 1 diet and honey thick liquids with use of a second swallow and full supervision to monitor for small, single boluses. SLP Visit Diagnosis Dysphagia, oropharyngeal phase (R13.12) Attention and concentration deficit following -- Frontal lobe and executive function deficit following -- Impact on safety and function Moderate aspiration risk   CHL IP TREATMENT RECOMMENDATION 05/08/2017 Treatment Recommendations Therapy as outlined in treatment plan below   Prognosis 05/08/2017 Prognosis for Safe Diet Advancement Good Barriers to Reach Goals Cognitive deficits Barriers/Prognosis Comment -- CHL IP DIET RECOMMENDATION 05/08/2017 SLP Diet Recommendations Dysphagia 1 (Puree) solids;Honey thick liquids Liquid Administration via Cup;No straw Medication Administration Crushed with puree Compensations Minimize environmental  are clear. Improved lung volumes. Stable cholecystectomy clips. Partially visible thoracolumbar posterior  spinal hardware. IMPRESSION: 1. Endotracheal tube tip in good position at the level the clavicles. 2.  No acute cardiopulmonary abnormality. Electronically Signed   By: Genevie Ann M.D.   On: 05/05/2017 11:12   Dg Abd Portable 1v  Result Date: 05/06/2017 CLINICAL DATA:  OG tube placement. EXAM: PORTABLE ABDOMEN - 1 VIEW COMPARISON:  None. FINDINGS: OG tube tip is in the region of the distal stomach. Bowel gas pattern is normal. IMPRESSION: OG tube tip in the region of the distal stomach. Electronically Signed   By: Lorriane Shire M.D.   On: 05/06/2017 13:13   Dg Swallowing Func-speech Pathology  Result Date: 05/08/2017 Objective Swallowing Evaluation: Type of Study: MBS-Modified Barium Swallow Study Patient Details Name: Cory Pacheco MRN: [id number] Date of Birth: [birth year] Today's Date: 05/08/2017 Time: SLP Start Time (ACUTE ONLY): 1301-SLP Stop Time (ACUTE ONLY): 1312 SLP Time Calculation (min) (ACUTE ONLY): 11 min Past Medical History: Past Medical History: Diagnosis Date . Arthritis  . Depression  . Hypertension  Past Surgical History: Past Surgical History: Procedure Laterality Date . CHOLECYSTECTOMY   . COLONOSCOPY   . COLONOSCOPY N/A 08/03/2013  Procedure: COLONOSCOPY;  Surgeon: Rogene Houston, MD;  [facility];  Service: Endoscopy;  Laterality: N/A;  41 . TONSILLECTOMY   HPI: Cory Haegele Crownoveris an 71 y.o.malewho has a seizure history and is followed by the [facility] hospital. 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 5 years ago he suffered a fall in which she started to have seizures 3 months later. He is continuing to have poorly controlled seizure activity. e did suffer a grand mal seizure the last for about 1 minute and then was having decreased airway which at that point he was intubated, 05/05/17 with extubation 05/06/17. Chest x ray without active disease.   Subjective: pt alert, requires cueing Assessment / Plan / Recommendation CHL IP CLINICAL IMPRESSIONS 05/08/2017 Clinical  Impression Pt has a moderate oropharyngeal dysphagia, with oral phase largely impacted by mentation. He has reduced bolus cohesion and slow transit of all consistencies, and he consumes liquids at a rapid rate of intake despite cues from SLP to attempt smaller, single sips. Barium spills into the pharynx with thickened liquids pooling all the way in the pyriform sinuses in large volumes before he initiates a swallow. Pt has reduced hyolaryngeal movement, epiglottic deflection, and airway closure, causing deep, silent penetration and intermittent aspiration with nectar thick liquids. His cued cough is not effective at clearing laryngeal penetrates. He has similar penetration with soft solid boluses, but when he consumes purees and honey thick liquids he has only intermittent, shallow penetration that he can more easily clear. Pt has moderate-severe vallecular residue post-swallow, but this is reduced with cues for a second swallow. Recommend downgrade to Dys 1 diet and honey thick liquids with use of a second swallow and full supervision to monitor for small, single boluses. SLP Visit Diagnosis Dysphagia, oropharyngeal phase (R13.12) Attention and concentration deficit following -- Frontal lobe and executive function deficit following -- Impact on safety and function Moderate aspiration risk   CHL IP TREATMENT RECOMMENDATION 05/08/2017 Treatment Recommendations Therapy as outlined in treatment plan below   Prognosis 05/08/2017 Prognosis for Safe Diet Advancement Good Barriers to Reach Goals Cognitive deficits Barriers/Prognosis Comment -- CHL IP DIET RECOMMENDATION 05/08/2017 SLP Diet Recommendations Dysphagia 1 (Puree) solids;Honey thick liquids Liquid Administration via Cup;No straw Medication Administration Crushed with puree Compensations Minimize environmental

## 2017-05-12 NOTE — Progress Notes (Signed)
Occupational Therapy Treatment Patient Details Name: Cory Pacheco MRN: [id number] DOB: 1946/10/02 Today's Date: 05/12/2017    History of present illness 71 year old male with history of seizure managed on depakote. Presented 5/22 with one week confusion following a seizure. Had seizure again in ED. Intubated after prolonged postictal period and transferred to [facility], seen by neurology, seizure has been controlled, successfully extubated. Encephalopathy.    OT comments  Pt participated well with OT.  Slow processing and cues for safety  Follow Up Recommendations  SNF    Equipment Recommendations  None recommended by OT    Recommendations for Other Services      Precautions / Restrictions Precautions Precautions: Fall Restrictions Weight Bearing Restrictions: No       Mobility Bed Mobility               General bed mobility comments: pt was at EOB  Transfers Overall transfer level: Needs assistance     Sit to Stand: Min assist         General transfer comment: used RW to get to sink then hand held assistance for walking to chair. Cues for hand placement when using RW    Balance     Sitting balance-Leahy Scale:  (poor to fair) Sitting balance - Comments: pt was taking medications when I arrived.  He was slowly losing balance and wife assisted him   Standing balance support: During functional activity Standing balance-Leahy Scale: Poor                             ADL either performed or assessed with clinical judgement   ADL       Grooming: Min guard;Wash/dry face;Brushing hair;Standing                   Toilet Transfer: Minimal assistance;Ambulation (to chair)             General ADL Comments: initially used RW to walk to sink. Pt needed assistance to steer it, and he was releasing a hand at times.  Did not use it when walking back to chair. Assisted him with cleaning buttocks as chux pad was not clean.  Wife states  that he cannot do this himself since back sx.     Vision       Perception     Praxis      Cognition Arousal/Alertness: Awake/alert Behavior During Therapy: WFL for tasks assessed/performed Overall Cognitive Status: Impaired/Different from baseline Area of Impairment: Attention;Memory;Problem solving;Safety/judgement                   Current Attention Level: Sustained Memory: Decreased recall of precautions;Decreased short-term memory Following Commands: Follows one step commands with increased time Safety/Judgement: Decreased awareness of safety;Decreased awareness of deficits Awareness: Emergent Problem Solving: Slow processing;Decreased initiation;Difficulty sequencing;Requires verbal cues          Exercises     Shoulder Instructions       General Comments      Pertinent Vitals/ Pain       Pain Assessment: No/denies pain  Home Living                                          Prior Functioning/Environment              Frequency  Min 2X/week  Progress Toward Goals  OT Goals(current goals can now be found in the care plan section)  Progress towards OT goals: Progressing toward goals  Acute Rehab OT Goals Patient Stated Goal: to get better Time For Goal Achievement: 05/22/17  Plan      Co-evaluation                 AM-PAC PT "6 Clicks" Daily Activity     Outcome Measure   Help from another person eating meals?: A Lot Help from another person taking care of personal grooming?: A Little Help from another person toileting, which includes using toliet, bedpan, or urinal?: A Lot Help from another person bathing (including washing, rinsing, drying)?: A Lot Help from another person to put on and taking off regular upper body clothing?: A Lot Help from another person to put on and taking off regular lower body clothing?: A Lot 6 Click Score: 13    End of Session    OT Visit Diagnosis: Unsteadiness on feet  (R26.81);Muscle weakness (generalized) (M62.81);Cognitive communication deficit (R41.841) Symptoms and signs involving cognitive functions: Other cerebrovascular disease   Activity Tolerance Patient tolerated treatment well   Patient Left in chair;with call bell/phone within reach;with chair alarm set;with family/visitor present   Nurse Communication          Time: 1308-6578 OT Time Calculation (min): 20 min  Charges: OT General Charges $OT Visit: 1 Procedure OT Treatments $Self Care/Home Management : 8-22 mins  Marica Otter, OTR/L [phone] 05/12/2017   Hendrix Yurkovich 05/12/2017, 10:52 AM

## 2017-05-12 NOTE — Discharge Instructions (Signed)
Follow with Primary MD Caryl Bis, MD in 7 days   Get CBC, CMP,  checked  by Primary MD next visit.    Activity: As tolerated with Full fall precautions use walker/cane & assistance as needed   Disposition SNF   Diet: Dys 2 with honey thick, with feeding assistance and aspiration precautions.  For Heart failure patients - Check your Weight same time everyday, if you gain over 2 pounds, or you develop in leg swelling, experience more shortness of breath or chest pain, call your Primary MD immediately. Follow Cardiac Low Salt Diet and 1.5 lit/day fluid restriction.   On your next visit with your primary care physician please Get Medicines reviewed and adjusted.   Please request your Prim.MD to go over all Hospital Tests and Procedure/Radiological results at the follow up, please get all Hospital records sent to your Prim MD by signing hospital release before you go home.   If you experience worsening of your admission symptoms, develop shortness of breath, life threatening emergency, suicidal or homicidal thoughts you must seek medical attention immediately by calling 911 or calling your MD immediately  if symptoms less severe.  You Must read complete instructions/literature along with all the possible adverse reactions/side effects for all the Medicines you take and that have been prescribed to you. Take any new Medicines after you have completely understood and accpet all the possible adverse reactions/side effects.   Do not drive, operating heavy machinery, perform activities at heights, swimming or participation in water activities or provide baby sitting services if your were admitted for syncope or siezures until you have seen by Primary MD or a Neurologist and advised to do so again.  Do not drive when taking Pain medications.    Do not take more than prescribed Pain, Sleep and Anxiety Medications  Special Instructions: If you have smoked or chewed Tobacco  in the last 2  yrs please stop smoking, stop any regular Alcohol  and or any Recreational drug use.  Wear Seat belts while driving.   Please note  You were cared for by a hospitalist during your hospital stay. If you have any questions about your discharge medications or the care you received while you were in the hospital after you are discharged, you can call the unit and asked to speak with the hospitalist on call if the hospitalist that took care of you is not available. Once you are discharged, your primary care physician will handle any further medical issues. Please note that NO REFILLS for any discharge medications will be authorized once you are discharged, as it is imperative that you return to your primary care physician (or establish a relationship with a primary care physician if you do not have one) for your aftercare needs so that they can reassess your need for medications and monitor your lab values.

## 2017-05-12 NOTE — Care Management Note (Signed)
Case Management Note  Patient Details  Name: [name] MRN: [id number] Date of Birth: 09-Jul-1946  Subjective/Objective:                    Action/Plan: Pt discharging to [facility]. No further needs per CM.   Expected Discharge Date:  05/12/17               Expected Discharge Plan:  Skilled Nursing Facility  In-House Referral:  Clinical Social Work  Discharge planning Services  CM Consult  Post Acute Care Choice:    Choice offered to:     DME Arranged:    DME Agency:     HH Arranged:    [place] Agency:     Status of Service:  Completed, signed off  If discussed at [employer], dates discussed:    Additional Comments:  Pollie Friar, RN 05/12/2017, 3:27 PM

## 2017-05-12 NOTE — Progress Notes (Signed)
Modified Barium Swallow Progress Note  Patient Details  Name: Cory Pacheco MRN: [id number] Date of Birth: April 01, 1946  Today's Date: 05/12/2017  Modified Barium Swallow completed.  Full report located under Chart Review in the Imaging Section.  Brief recommendations include the following:  Clinical Impression  Pt shows minimal improvements in oropharyngeal swallow since recent MBS on 5/25. He has improved alertness and sustained attention as well as mildly improved strength with reduction in vallecular residuals. Unfortunately he continues to have significant premature spillage of all liquids along with reduced hyolaryngeal movement, epiglottic deflection, and airway closure, causing deep, silent penetration that occurs mostly during the swallow with thin and nectar thick liquids. A chin tuck was attempted to facilitate oral containment, but unforunately he still tilted his head posteriorly (suspect to help with oral transit) before swallowing - even when a straw was used for positioning. With honey thick liquids he has only flash penetration. For now, would advance diet to Dys 2 textures but continuing with small cup sips of honey thick liquids. Pt would benefit from continued dysphagia therapy upon d/c to SNF to maximize swallowing safety and oropharyngeal function.    Swallow Evaluation Recommendations       SLP Diet Recommendations: Dysphagia 2 (Fine chop) solids;Honey thick liquids   Liquid Administration via: Cup;No straw   Medication Administration: Whole meds with puree   Supervision: Patient able to self feed;Full supervision/cueing for compensatory strategies   Compensations: Minimize environmental distractions;Slow rate;Small sips/bites;Clear throat intermittently;Multiple dry swallows after each bite/sip   Postural Changes: Remain semi-upright after after feeds/meals (Comment);Seated upright at 90 degrees   Oral Care Recommendations: Oral care BID   Other  Recommendations: Order thickener from pharmacy;Prohibited food (jello, ice cream, thin soups);Remove water pitcher    Germain Osgood 05/12/2017,2:35 PM   Germain Osgood, M.A. CCC-SLP [phone]

## 2017-05-12 NOTE — Progress Notes (Signed)
Speech-Language Pathology Note:  SLP was paged by RN, stating that MD would like pt to be reassessed for potential diet upgrade prior to d/c to SNF. Given silent penetration/aspiration observed on previous study, will plan to proceed with MBS to assess for any potential improvement - scheduled for early afternoon with radiology.  Germain Osgood, M.A. CCC-SLP [phone]

## 2017-05-13 DIAGNOSIS — R569 Unspecified convulsions: Secondary | ICD-10-CM | POA: Diagnosis not present

## 2017-05-15 DIAGNOSIS — J9600 Acute respiratory failure, unspecified whether with hypoxia or hypercapnia: Secondary | ICD-10-CM | POA: Diagnosis not present

## 2017-05-15 DIAGNOSIS — F319 Bipolar disorder, unspecified: Secondary | ICD-10-CM | POA: Diagnosis not present

## 2017-05-15 DIAGNOSIS — G4700 Insomnia, unspecified: Secondary | ICD-10-CM | POA: Diagnosis not present

## 2017-05-15 DIAGNOSIS — I10 Essential (primary) hypertension: Secondary | ICD-10-CM | POA: Diagnosis not present

## 2017-05-15 DIAGNOSIS — G9340 Encephalopathy, unspecified: Secondary | ICD-10-CM | POA: Diagnosis not present

## 2017-05-15 DIAGNOSIS — R2681 Unsteadiness on feet: Secondary | ICD-10-CM | POA: Diagnosis not present

## 2017-05-15 DIAGNOSIS — E119 Type 2 diabetes mellitus without complications: Secondary | ICD-10-CM | POA: Diagnosis not present

## 2017-05-15 DIAGNOSIS — G40901 Epilepsy, unspecified, not intractable, with status epilepticus: Secondary | ICD-10-CM | POA: Diagnosis not present

## 2017-05-15 DIAGNOSIS — F419 Anxiety disorder, unspecified: Secondary | ICD-10-CM | POA: Diagnosis not present

## 2017-05-15 DIAGNOSIS — R1312 Dysphagia, oropharyngeal phase: Secondary | ICD-10-CM | POA: Diagnosis not present

## 2017-05-15 DIAGNOSIS — R41841 Cognitive communication deficit: Secondary | ICD-10-CM | POA: Diagnosis not present

## 2017-05-15 DIAGNOSIS — M6281 Muscle weakness (generalized): Secondary | ICD-10-CM | POA: Diagnosis not present

## 2017-05-15 DIAGNOSIS — K219 Gastro-esophageal reflux disease without esophagitis: Secondary | ICD-10-CM | POA: Diagnosis not present

## 2017-05-15 DIAGNOSIS — E785 Hyperlipidemia, unspecified: Secondary | ICD-10-CM | POA: Diagnosis not present

## 2017-05-15 DIAGNOSIS — M1990 Unspecified osteoarthritis, unspecified site: Secondary | ICD-10-CM | POA: Diagnosis not present

## 2017-05-26 DIAGNOSIS — R569 Unspecified convulsions: Secondary | ICD-10-CM | POA: Diagnosis not present

## 2017-05-27 DIAGNOSIS — R1312 Dysphagia, oropharyngeal phase: Secondary | ICD-10-CM | POA: Diagnosis not present

## 2017-05-27 DIAGNOSIS — R1313 Dysphagia, pharyngeal phase: Secondary | ICD-10-CM | POA: Diagnosis not present

## 2017-06-03 DIAGNOSIS — E785 Hyperlipidemia, unspecified: Secondary | ICD-10-CM | POA: Diagnosis not present

## 2017-06-03 DIAGNOSIS — F319 Bipolar disorder, unspecified: Secondary | ICD-10-CM | POA: Diagnosis not present

## 2017-06-03 DIAGNOSIS — F209 Schizophrenia, unspecified: Secondary | ICD-10-CM | POA: Diagnosis not present

## 2017-06-03 DIAGNOSIS — Z79891 Long term (current) use of opiate analgesic: Secondary | ICD-10-CM | POA: Diagnosis not present

## 2017-06-03 DIAGNOSIS — G3184 Mild cognitive impairment, so stated: Secondary | ICD-10-CM | POA: Diagnosis not present

## 2017-06-03 DIAGNOSIS — G40411 Other generalized epilepsy and epileptic syndromes, intractable, with status epilepticus: Secondary | ICD-10-CM | POA: Diagnosis not present

## 2017-06-03 DIAGNOSIS — R262 Difficulty in walking, not elsewhere classified: Secondary | ICD-10-CM | POA: Diagnosis not present

## 2017-06-03 DIAGNOSIS — E119 Type 2 diabetes mellitus without complications: Secondary | ICD-10-CM | POA: Diagnosis not present

## 2017-06-03 DIAGNOSIS — F419 Anxiety disorder, unspecified: Secondary | ICD-10-CM | POA: Diagnosis not present

## 2017-06-03 DIAGNOSIS — Z7982 Long term (current) use of aspirin: Secondary | ICD-10-CM | POA: Diagnosis not present

## 2017-06-03 DIAGNOSIS — M6281 Muscle weakness (generalized): Secondary | ICD-10-CM | POA: Diagnosis not present

## 2017-06-03 DIAGNOSIS — Z7951 Long term (current) use of inhaled steroids: Secondary | ICD-10-CM | POA: Diagnosis not present

## 2017-06-03 DIAGNOSIS — K219 Gastro-esophageal reflux disease without esophagitis: Secondary | ICD-10-CM | POA: Diagnosis not present

## 2017-06-03 DIAGNOSIS — R1312 Dysphagia, oropharyngeal phase: Secondary | ICD-10-CM | POA: Diagnosis not present

## 2017-06-14 DIAGNOSIS — G3184 Mild cognitive impairment, so stated: Secondary | ICD-10-CM | POA: Diagnosis not present

## 2017-06-14 DIAGNOSIS — Z7982 Long term (current) use of aspirin: Secondary | ICD-10-CM | POA: Diagnosis not present

## 2017-06-14 DIAGNOSIS — F319 Bipolar disorder, unspecified: Secondary | ICD-10-CM | POA: Diagnosis not present

## 2017-06-14 DIAGNOSIS — R262 Difficulty in walking, not elsewhere classified: Secondary | ICD-10-CM | POA: Diagnosis not present

## 2017-06-14 DIAGNOSIS — E785 Hyperlipidemia, unspecified: Secondary | ICD-10-CM | POA: Diagnosis not present

## 2017-06-14 DIAGNOSIS — G40411 Other generalized epilepsy and epileptic syndromes, intractable, with status epilepticus: Secondary | ICD-10-CM | POA: Diagnosis not present

## 2017-06-14 DIAGNOSIS — F209 Schizophrenia, unspecified: Secondary | ICD-10-CM | POA: Diagnosis not present

## 2017-06-14 DIAGNOSIS — K219 Gastro-esophageal reflux disease without esophagitis: Secondary | ICD-10-CM | POA: Diagnosis not present

## 2017-06-14 DIAGNOSIS — F419 Anxiety disorder, unspecified: Secondary | ICD-10-CM | POA: Diagnosis not present

## 2017-06-14 DIAGNOSIS — M6281 Muscle weakness (generalized): Secondary | ICD-10-CM | POA: Diagnosis not present

## 2017-06-14 DIAGNOSIS — R1312 Dysphagia, oropharyngeal phase: Secondary | ICD-10-CM | POA: Diagnosis not present

## 2017-06-14 DIAGNOSIS — E119 Type 2 diabetes mellitus without complications: Secondary | ICD-10-CM | POA: Diagnosis not present

## 2017-06-14 DIAGNOSIS — Z79891 Long term (current) use of opiate analgesic: Secondary | ICD-10-CM | POA: Diagnosis not present

## 2017-06-14 DIAGNOSIS — Z7951 Long term (current) use of inhaled steroids: Secondary | ICD-10-CM | POA: Diagnosis not present

## 2017-06-16 DIAGNOSIS — F3132 Bipolar disorder, current episode depressed, moderate: Secondary | ICD-10-CM | POA: Diagnosis not present

## 2017-06-16 DIAGNOSIS — E782 Mixed hyperlipidemia: Secondary | ICD-10-CM | POA: Diagnosis not present

## 2017-06-16 DIAGNOSIS — I10 Essential (primary) hypertension: Secondary | ICD-10-CM | POA: Diagnosis not present

## 2017-06-16 DIAGNOSIS — E119 Type 2 diabetes mellitus without complications: Secondary | ICD-10-CM | POA: Diagnosis not present

## 2017-06-16 DIAGNOSIS — K210 Gastro-esophageal reflux disease with esophagitis: Secondary | ICD-10-CM | POA: Diagnosis not present

## 2017-06-16 DIAGNOSIS — Z9189 Other specified personal risk factors, not elsewhere classified: Secondary | ICD-10-CM | POA: Diagnosis not present

## 2017-06-16 DIAGNOSIS — E1065 Type 1 diabetes mellitus with hyperglycemia: Secondary | ICD-10-CM | POA: Diagnosis not present

## 2017-06-17 DIAGNOSIS — Z7982 Long term (current) use of aspirin: Secondary | ICD-10-CM | POA: Diagnosis not present

## 2017-06-17 DIAGNOSIS — G40909 Epilepsy, unspecified, not intractable, without status epilepticus: Secondary | ICD-10-CM | POA: Diagnosis not present

## 2017-06-17 DIAGNOSIS — R000 Tachycardia, unspecified: Secondary | ICD-10-CM | POA: Diagnosis not present

## 2017-06-17 DIAGNOSIS — F419 Anxiety disorder, unspecified: Secondary | ICD-10-CM | POA: Diagnosis not present

## 2017-06-17 DIAGNOSIS — I10 Essential (primary) hypertension: Secondary | ICD-10-CM | POA: Diagnosis not present

## 2017-06-17 DIAGNOSIS — E7800 Pure hypercholesterolemia, unspecified: Secondary | ICD-10-CM | POA: Diagnosis not present

## 2017-06-17 DIAGNOSIS — Z79899 Other long term (current) drug therapy: Secondary | ICD-10-CM | POA: Diagnosis not present

## 2017-06-17 DIAGNOSIS — R569 Unspecified convulsions: Secondary | ICD-10-CM | POA: Diagnosis not present

## 2017-06-17 DIAGNOSIS — R410 Disorientation, unspecified: Secondary | ICD-10-CM | POA: Diagnosis not present

## 2017-06-18 DIAGNOSIS — R410 Disorientation, unspecified: Secondary | ICD-10-CM | POA: Diagnosis not present

## 2017-06-19 DIAGNOSIS — R569 Unspecified convulsions: Secondary | ICD-10-CM | POA: Diagnosis not present

## 2017-06-19 DIAGNOSIS — F2089 Other schizophrenia: Secondary | ICD-10-CM | POA: Diagnosis not present

## 2017-06-20 DIAGNOSIS — R569 Unspecified convulsions: Secondary | ICD-10-CM | POA: Diagnosis not present

## 2017-06-20 DIAGNOSIS — F2089 Other schizophrenia: Secondary | ICD-10-CM | POA: Diagnosis not present

## 2017-06-21 DIAGNOSIS — R569 Unspecified convulsions: Secondary | ICD-10-CM | POA: Diagnosis not present

## 2017-06-21 DIAGNOSIS — F2089 Other schizophrenia: Secondary | ICD-10-CM | POA: Diagnosis not present

## 2017-06-22 DIAGNOSIS — M5136 Other intervertebral disc degeneration, lumbar region: Secondary | ICD-10-CM | POA: Diagnosis not present

## 2017-06-22 DIAGNOSIS — R4182 Altered mental status, unspecified: Secondary | ICD-10-CM | POA: Diagnosis not present

## 2017-06-22 DIAGNOSIS — K219 Gastro-esophageal reflux disease without esophagitis: Secondary | ICD-10-CM | POA: Diagnosis not present

## 2017-06-22 DIAGNOSIS — F200 Paranoid schizophrenia: Secondary | ICD-10-CM | POA: Diagnosis not present

## 2017-06-22 DIAGNOSIS — F05 Delirium due to known physiological condition: Secondary | ICD-10-CM | POA: Diagnosis not present

## 2017-06-22 DIAGNOSIS — I10 Essential (primary) hypertension: Secondary | ICD-10-CM | POA: Diagnosis not present

## 2017-06-22 DIAGNOSIS — Z79899 Other long term (current) drug therapy: Secondary | ICD-10-CM | POA: Diagnosis not present

## 2017-06-22 DIAGNOSIS — F0390 Unspecified dementia without behavioral disturbance: Secondary | ICD-10-CM | POA: Diagnosis not present

## 2017-06-22 DIAGNOSIS — R569 Unspecified convulsions: Secondary | ICD-10-CM | POA: Diagnosis not present

## 2017-06-22 DIAGNOSIS — Z882 Allergy status to sulfonamides status: Secondary | ICD-10-CM | POA: Diagnosis not present

## 2017-06-22 DIAGNOSIS — Z888 Allergy status to other drugs, medicaments and biological substances status: Secondary | ICD-10-CM | POA: Diagnosis not present

## 2017-06-22 DIAGNOSIS — Z7982 Long term (current) use of aspirin: Secondary | ICD-10-CM | POA: Diagnosis not present

## 2017-06-22 DIAGNOSIS — E119 Type 2 diabetes mellitus without complications: Secondary | ICD-10-CM | POA: Diagnosis not present

## 2017-06-22 DIAGNOSIS — E785 Hyperlipidemia, unspecified: Secondary | ICD-10-CM | POA: Diagnosis not present

## 2017-06-22 DIAGNOSIS — G40409 Other generalized epilepsy and epileptic syndromes, not intractable, without status epilepticus: Secondary | ICD-10-CM | POA: Diagnosis not present

## 2017-06-22 DIAGNOSIS — F2089 Other schizophrenia: Secondary | ICD-10-CM | POA: Diagnosis not present

## 2017-06-22 DIAGNOSIS — M4810 Ankylosing hyperostosis [Forestier], site unspecified: Secondary | ICD-10-CM | POA: Diagnosis not present

## 2017-06-22 DIAGNOSIS — C44622 Squamous cell carcinoma of skin of right upper limb, including shoulder: Secondary | ICD-10-CM | POA: Diagnosis not present

## 2017-06-30 ENCOUNTER — Ambulatory Visit: Payer: Self-pay | Admitting: Neurology

## 2017-07-01 DIAGNOSIS — Z6835 Body mass index (BMI) 35.0-35.9, adult: Secondary | ICD-10-CM | POA: Diagnosis not present

## 2017-07-01 DIAGNOSIS — F3132 Bipolar disorder, current episode depressed, moderate: Secondary | ICD-10-CM | POA: Diagnosis not present

## 2017-07-01 DIAGNOSIS — G40309 Generalized idiopathic epilepsy and epileptic syndromes, not intractable, without status epilepticus: Secondary | ICD-10-CM | POA: Diagnosis not present

## 2017-09-07 DIAGNOSIS — E119 Type 2 diabetes mellitus without complications: Secondary | ICD-10-CM | POA: Diagnosis not present

## 2017-09-07 DIAGNOSIS — G40411 Other generalized epilepsy and epileptic syndromes, intractable, with status epilepticus: Secondary | ICD-10-CM | POA: Diagnosis not present

## 2017-09-07 DIAGNOSIS — R1312 Dysphagia, oropharyngeal phase: Secondary | ICD-10-CM | POA: Diagnosis not present

## 2017-09-07 DIAGNOSIS — G3184 Mild cognitive impairment, so stated: Secondary | ICD-10-CM | POA: Diagnosis not present

## 2017-11-27 IMAGING — DX DG ABD PORTABLE 1V
1 series · 1 of 1 positions shown · non-contrast
Comparison: None.

CLINICAL DATA: OG tube placement.

EXAM:
PORTABLE ABDOMEN - 1 VIEW

[abdomen kub]
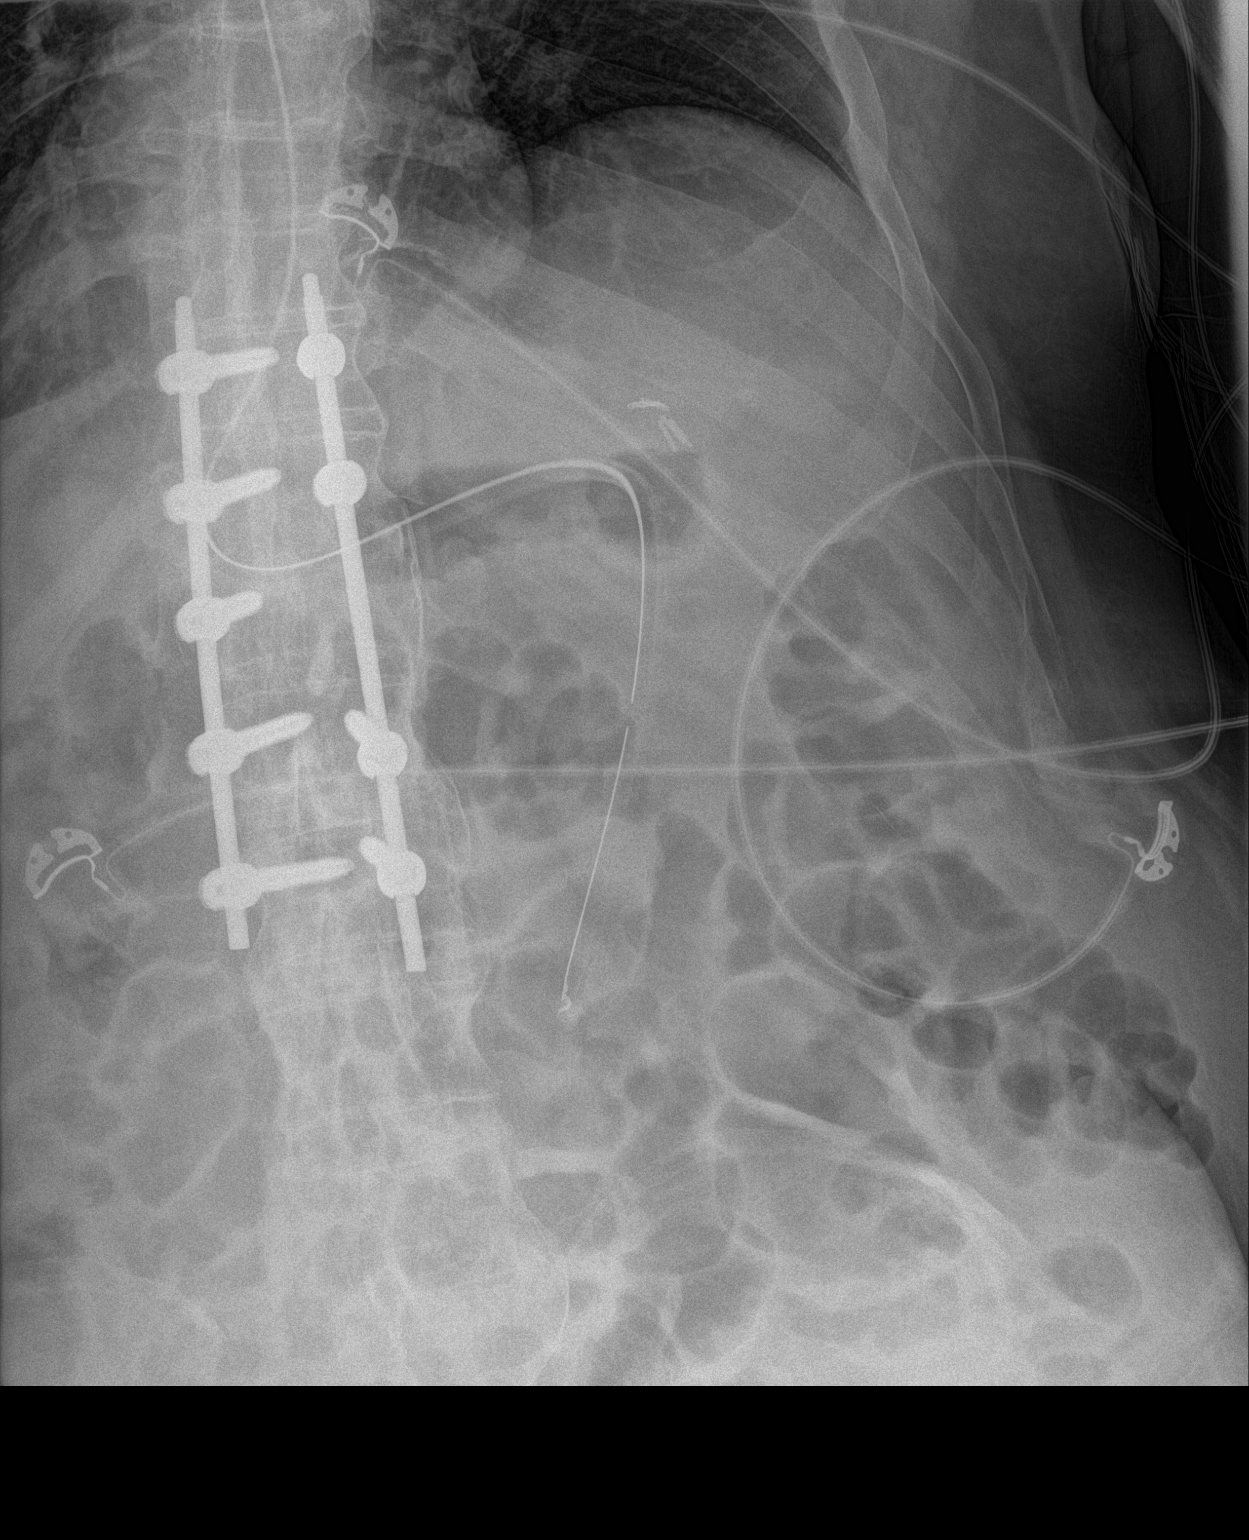

[1 of 1 positions shown; findings below may reference images not displayed]

FINDINGS: OG tube tip is in the region of the distal stomach. Bowel gas
pattern is normal.
IMPRESSION: OG tube tip in the region of the distal stomach.

## 2017-12-03 IMAGING — RF DG SWALLOWING FUNCTION - NRPT [facility]
1 series · 18 of 24 positions shown · non-contrast
Comparison: none

[Series 1: run · 35 acquisitions, 18 frames shown]
[im 1/35]
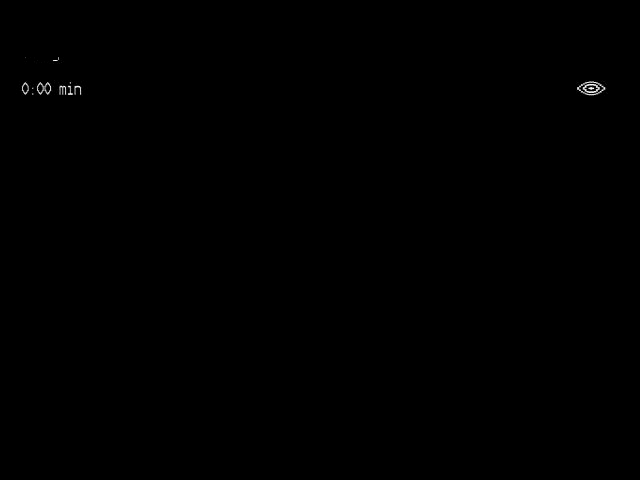
[im 3/35]
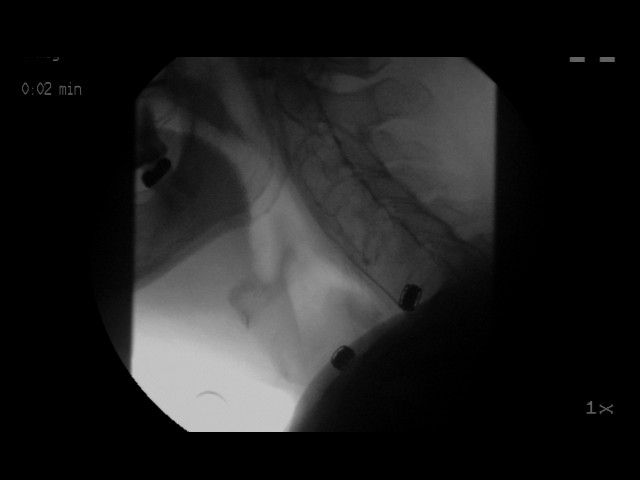
[im 5/35]
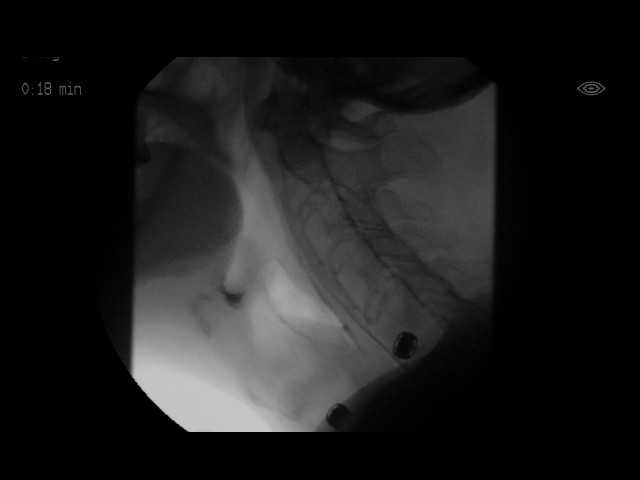
[im 6/35]
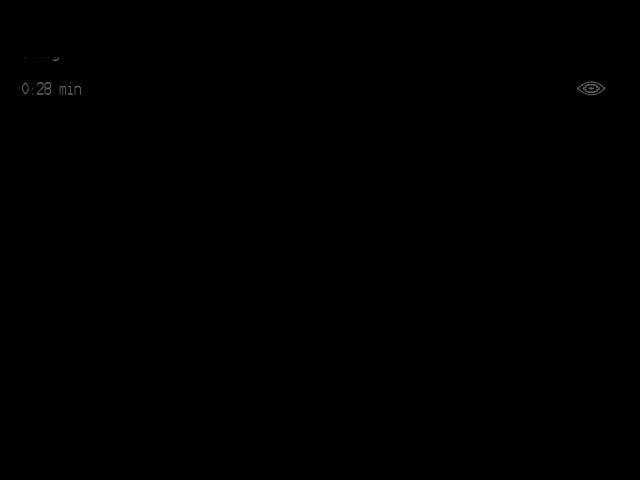
[im 9/35]
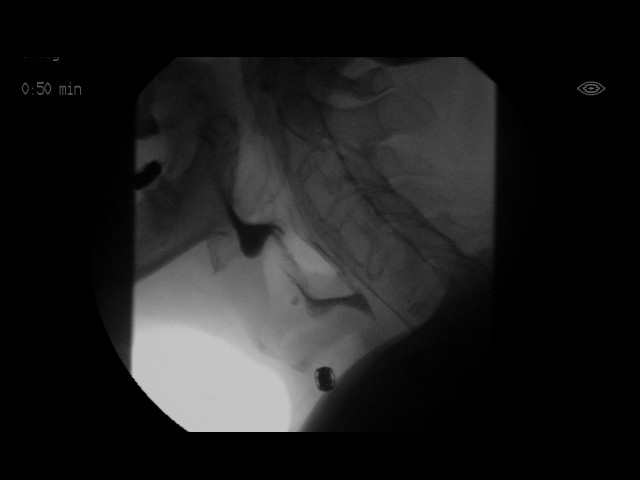
[im 11/35]
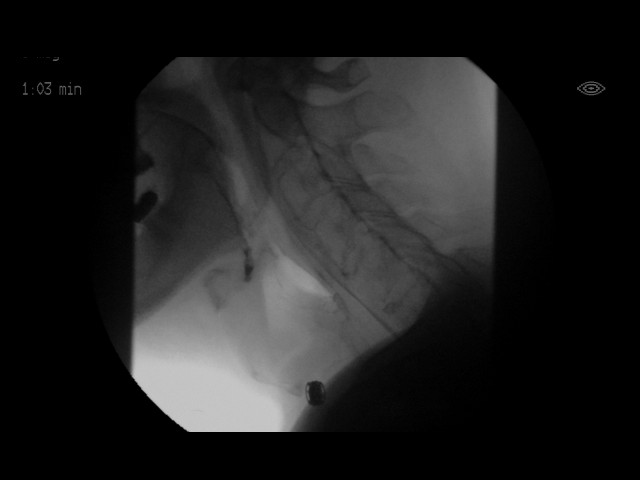
[im 12/35]
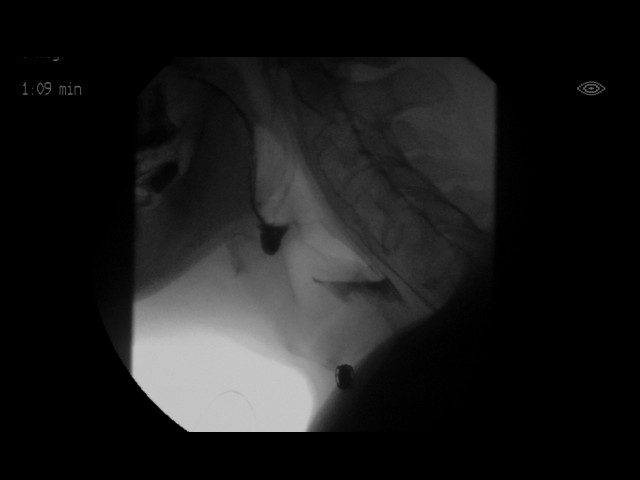
[im 15/35]
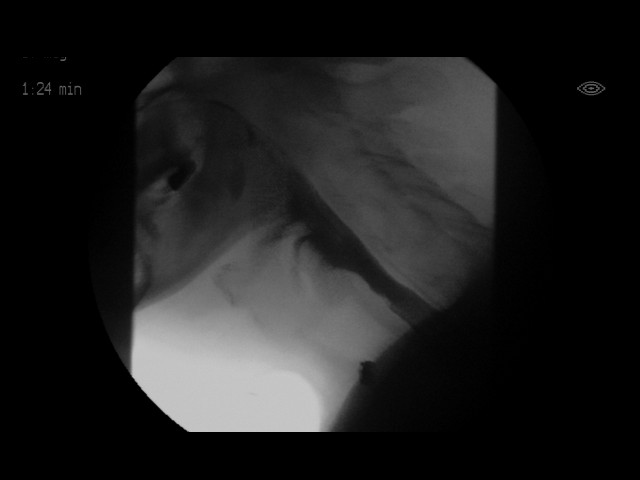
[im 17/35]
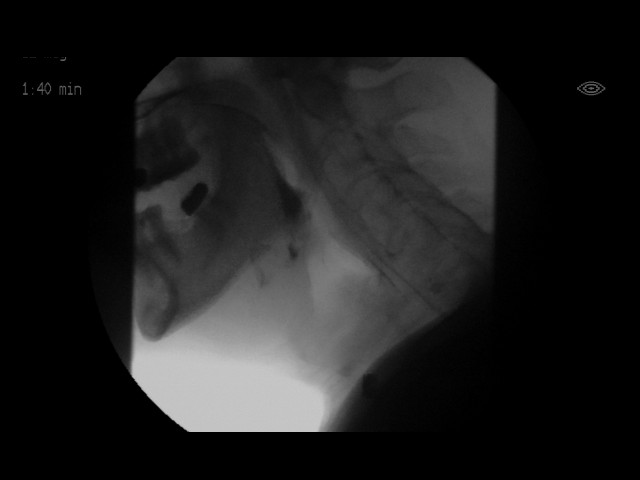
[im 18/35]
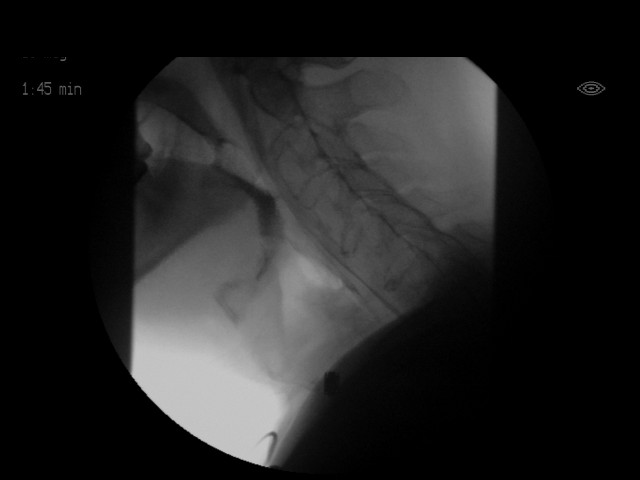
[im 21/35]
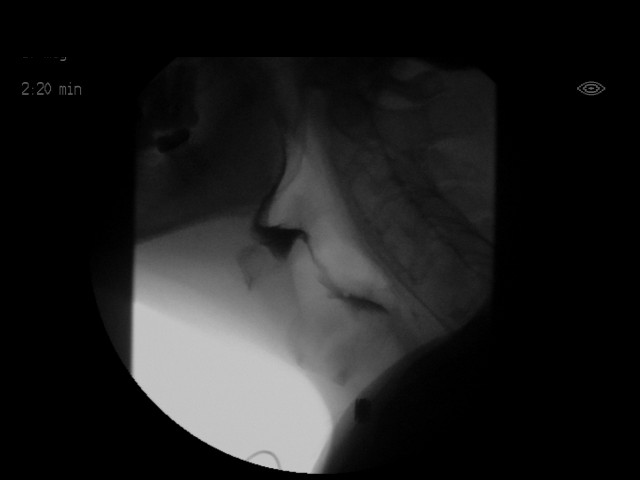
[im 23/35]
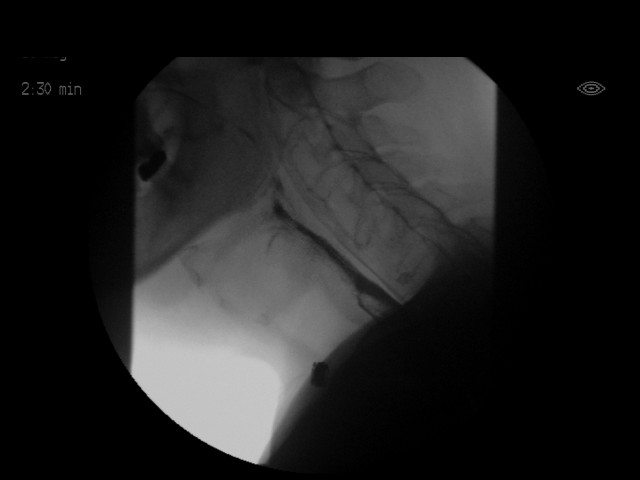
[im 24/35]
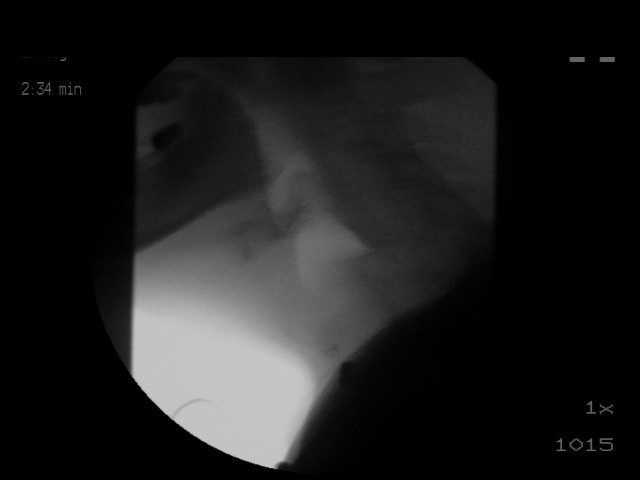
[im 27/35]
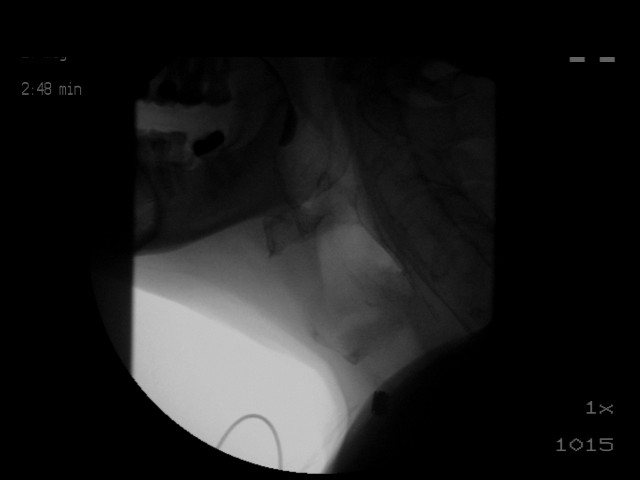
[im 29/35]
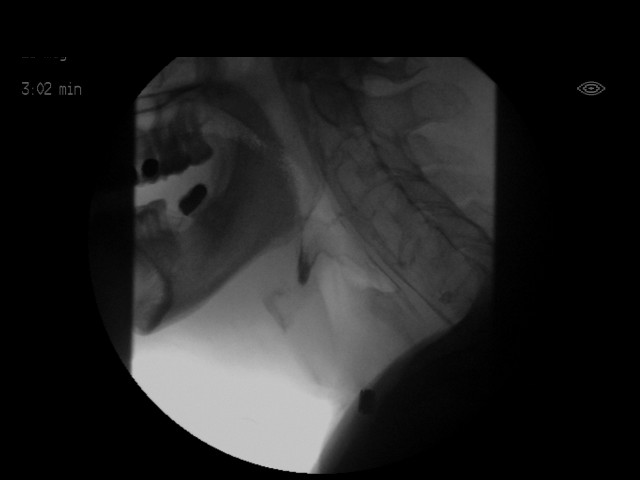
[im 30/35]
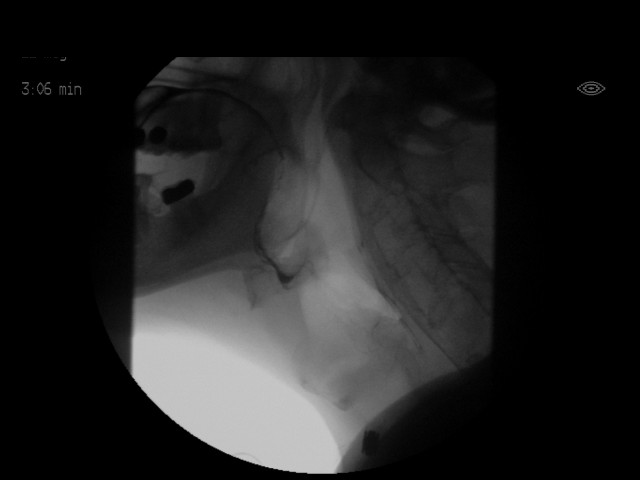
[im 33/35]
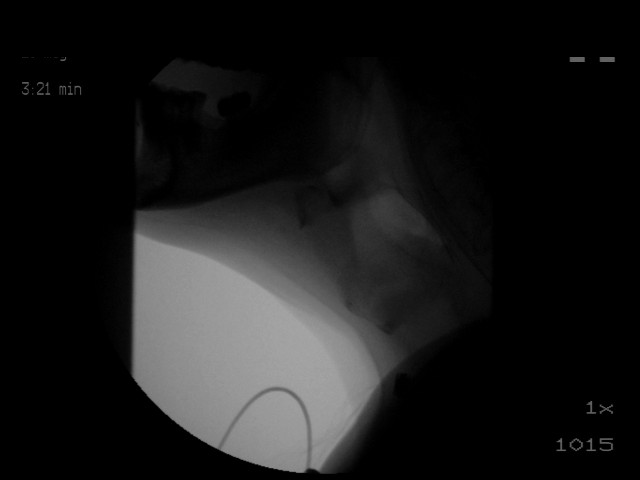
[im 35/35]
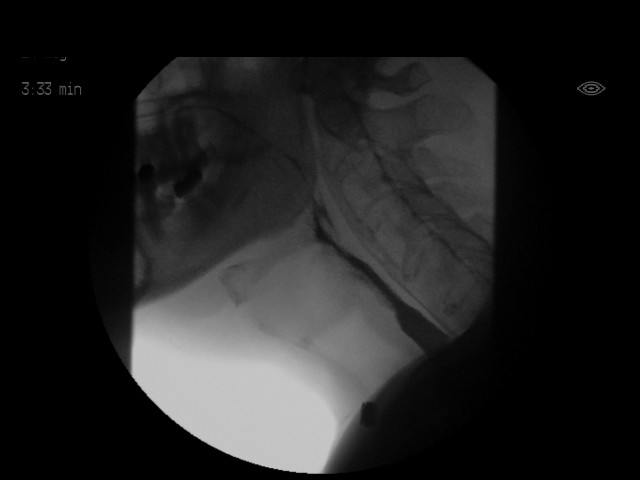

[18 of 24 positions shown; findings below may reference images not displayed]

FLUOROSCOPY FOR SWALLOWING FUNCTION STUDY:
Fluoroscopy was provided for swallowing function study, which was administered by a speech pathologist.  Final results and recommendations from this study are contained within the speech pathology report.

## 2018-02-12 DIAGNOSIS — F3132 Bipolar disorder, current episode depressed, moderate: Secondary | ICD-10-CM | POA: Diagnosis not present

## 2018-02-12 DIAGNOSIS — E1065 Type 1 diabetes mellitus with hyperglycemia: Secondary | ICD-10-CM | POA: Diagnosis not present

## 2018-02-12 DIAGNOSIS — K210 Gastro-esophageal reflux disease with esophagitis: Secondary | ICD-10-CM | POA: Diagnosis not present

## 2018-02-12 DIAGNOSIS — E782 Mixed hyperlipidemia: Secondary | ICD-10-CM | POA: Diagnosis not present

## 2018-02-12 DIAGNOSIS — Z9189 Other specified personal risk factors, not elsewhere classified: Secondary | ICD-10-CM | POA: Diagnosis not present

## 2018-02-12 DIAGNOSIS — G40309 Generalized idiopathic epilepsy and epileptic syndromes, not intractable, without status epilepticus: Secondary | ICD-10-CM | POA: Diagnosis not present

## 2018-02-12 DIAGNOSIS — I10 Essential (primary) hypertension: Secondary | ICD-10-CM | POA: Diagnosis not present

## 2018-02-12 DIAGNOSIS — G4733 Obstructive sleep apnea (adult) (pediatric): Secondary | ICD-10-CM | POA: Diagnosis not present

## 2018-02-16 DIAGNOSIS — Z6838 Body mass index (BMI) 38.0-38.9, adult: Secondary | ICD-10-CM | POA: Diagnosis not present

## 2018-02-16 DIAGNOSIS — E782 Mixed hyperlipidemia: Secondary | ICD-10-CM | POA: Diagnosis not present

## 2018-02-16 DIAGNOSIS — E119 Type 2 diabetes mellitus without complications: Secondary | ICD-10-CM | POA: Diagnosis not present

## 2018-02-16 DIAGNOSIS — I10 Essential (primary) hypertension: Secondary | ICD-10-CM | POA: Diagnosis not present

## 2018-02-16 DIAGNOSIS — F3132 Bipolar disorder, current episode depressed, moderate: Secondary | ICD-10-CM | POA: Diagnosis not present

## 2018-02-16 DIAGNOSIS — G4733 Obstructive sleep apnea (adult) (pediatric): Secondary | ICD-10-CM | POA: Diagnosis not present

## 2018-02-16 DIAGNOSIS — G40309 Generalized idiopathic epilepsy and epileptic syndromes, not intractable, without status epilepticus: Secondary | ICD-10-CM | POA: Diagnosis not present

## 2018-04-22 DIAGNOSIS — W228XXA Striking against or struck by other objects, initial encounter: Secondary | ICD-10-CM | POA: Diagnosis not present

## 2018-04-22 DIAGNOSIS — I10 Essential (primary) hypertension: Secondary | ICD-10-CM | POA: Diagnosis not present

## 2018-04-22 DIAGNOSIS — Z7982 Long term (current) use of aspirin: Secondary | ICD-10-CM | POA: Diagnosis not present

## 2018-04-22 DIAGNOSIS — S01112A Laceration without foreign body of left eyelid and periocular area, initial encounter: Secondary | ICD-10-CM | POA: Diagnosis not present

## 2018-04-22 DIAGNOSIS — R569 Unspecified convulsions: Secondary | ICD-10-CM | POA: Diagnosis not present

## 2018-04-22 DIAGNOSIS — Z23 Encounter for immunization: Secondary | ICD-10-CM | POA: Diagnosis not present

## 2018-04-22 DIAGNOSIS — S0990XA Unspecified injury of head, initial encounter: Secondary | ICD-10-CM | POA: Diagnosis not present

## 2018-04-22 DIAGNOSIS — F419 Anxiety disorder, unspecified: Secondary | ICD-10-CM | POA: Diagnosis not present

## 2018-04-22 DIAGNOSIS — Z79899 Other long term (current) drug therapy: Secondary | ICD-10-CM | POA: Diagnosis not present

## 2018-04-22 DIAGNOSIS — Z87891 Personal history of nicotine dependence: Secondary | ICD-10-CM | POA: Diagnosis not present

## 2018-04-22 DIAGNOSIS — E7800 Pure hypercholesterolemia, unspecified: Secondary | ICD-10-CM | POA: Diagnosis not present

## 2018-04-30 DIAGNOSIS — Z6838 Body mass index (BMI) 38.0-38.9, adult: Secondary | ICD-10-CM | POA: Diagnosis not present

## 2018-04-30 DIAGNOSIS — S01112A Laceration without foreign body of left eyelid and periocular area, initial encounter: Secondary | ICD-10-CM | POA: Diagnosis not present

## 2018-05-19 DIAGNOSIS — R262 Difficulty in walking, not elsewhere classified: Secondary | ICD-10-CM | POA: Diagnosis not present

## 2018-07-26 DIAGNOSIS — G40309 Generalized idiopathic epilepsy and epileptic syndromes, not intractable, without status epilepticus: Secondary | ICD-10-CM | POA: Diagnosis not present

## 2018-07-26 DIAGNOSIS — E119 Type 2 diabetes mellitus without complications: Secondary | ICD-10-CM | POA: Diagnosis not present

## 2018-07-26 DIAGNOSIS — Z6838 Body mass index (BMI) 38.0-38.9, adult: Secondary | ICD-10-CM | POA: Diagnosis not present

## 2018-07-26 DIAGNOSIS — G4733 Obstructive sleep apnea (adult) (pediatric): Secondary | ICD-10-CM | POA: Diagnosis not present

## 2018-07-26 DIAGNOSIS — Z0001 Encounter for general adult medical examination with abnormal findings: Secondary | ICD-10-CM | POA: Diagnosis not present

## 2018-07-26 DIAGNOSIS — Z1339 Encounter for screening examination for other mental health and behavioral disorders: Secondary | ICD-10-CM | POA: Diagnosis not present

## 2018-07-26 DIAGNOSIS — F3132 Bipolar disorder, current episode depressed, moderate: Secondary | ICD-10-CM | POA: Diagnosis not present

## 2018-07-26 DIAGNOSIS — E782 Mixed hyperlipidemia: Secondary | ICD-10-CM | POA: Diagnosis not present

## 2018-07-26 DIAGNOSIS — K219 Gastro-esophageal reflux disease without esophagitis: Secondary | ICD-10-CM | POA: Diagnosis not present

## 2018-07-26 DIAGNOSIS — I10 Essential (primary) hypertension: Secondary | ICD-10-CM | POA: Diagnosis not present

## 2018-08-27 DIAGNOSIS — Z23 Encounter for immunization: Secondary | ICD-10-CM | POA: Diagnosis not present

## 2019-01-10 DIAGNOSIS — S22069A Unspecified fracture of T7-T8 vertebra, initial encounter for closed fracture: Secondary | ICD-10-CM | POA: Diagnosis not present

## 2019-01-10 DIAGNOSIS — W1839XA Other fall on same level, initial encounter: Secondary | ICD-10-CM | POA: Diagnosis not present

## 2019-01-10 DIAGNOSIS — G20 Parkinson's disease: Secondary | ICD-10-CM | POA: Diagnosis not present

## 2019-01-10 DIAGNOSIS — M549 Dorsalgia, unspecified: Secondary | ICD-10-CM | POA: Diagnosis not present

## 2019-01-10 DIAGNOSIS — S22079A Unspecified fracture of T9-T10 vertebra, initial encounter for closed fracture: Secondary | ICD-10-CM | POA: Diagnosis not present

## 2019-01-19 DIAGNOSIS — R569 Unspecified convulsions: Secondary | ICD-10-CM | POA: Diagnosis not present

## 2019-01-19 DIAGNOSIS — R4182 Altered mental status, unspecified: Secondary | ICD-10-CM | POA: Diagnosis not present

## 2019-03-25 DIAGNOSIS — M546 Pain in thoracic spine: Secondary | ICD-10-CM | POA: Diagnosis not present

## 2019-06-20 DIAGNOSIS — A0472 Enterocolitis due to Clostridium difficile, not specified as recurrent: Secondary | ICD-10-CM | POA: Diagnosis not present

## 2019-06-20 DIAGNOSIS — R197 Diarrhea, unspecified: Secondary | ICD-10-CM | POA: Diagnosis not present

## 2019-08-09 DIAGNOSIS — I82402 Acute embolism and thrombosis of unspecified deep veins of left lower extremity: Secondary | ICD-10-CM | POA: Diagnosis not present

## 2019-08-09 DIAGNOSIS — I2699 Other pulmonary embolism without acute cor pulmonale: Secondary | ICD-10-CM | POA: Diagnosis not present

## 2019-08-09 DIAGNOSIS — Z7901 Long term (current) use of anticoagulants: Secondary | ICD-10-CM | POA: Diagnosis not present

## 2019-08-30 DIAGNOSIS — Z7901 Long term (current) use of anticoagulants: Secondary | ICD-10-CM | POA: Diagnosis not present

## 2019-08-30 DIAGNOSIS — A4152 Sepsis due to Pseudomonas: Secondary | ICD-10-CM | POA: Diagnosis not present

## 2019-08-30 DIAGNOSIS — I2699 Other pulmonary embolism without acute cor pulmonale: Secondary | ICD-10-CM | POA: Diagnosis not present

## 2019-09-06 DIAGNOSIS — I82402 Acute embolism and thrombosis of unspecified deep veins of left lower extremity: Secondary | ICD-10-CM | POA: Diagnosis not present

## 2019-09-06 DIAGNOSIS — Z7901 Long term (current) use of anticoagulants: Secondary | ICD-10-CM | POA: Diagnosis not present

## 2019-09-13 DIAGNOSIS — A4152 Sepsis due to Pseudomonas: Secondary | ICD-10-CM | POA: Diagnosis not present

## 2019-09-20 ENCOUNTER — Other Ambulatory Visit (HOSPITAL_COMMUNITY)
Admission: AD | Admit: 2019-09-20 | Discharge: 2019-09-20 | Disposition: A | Discharge status: Home / Self Care | Payer: PPO | Source: Other Acute Inpatient Hospital | Attending: Internal Medicine | Admitting: Internal Medicine

## 2019-09-20 DIAGNOSIS — A4152 Sepsis due to Pseudomonas: Secondary | ICD-10-CM | POA: Diagnosis not present

## 2019-09-20 LAB — PROTIME-INR
INR: 3.1 — ABNORMAL HIGH (ref 0.8–1.2)
Prothrombin Time: 31.3 seconds — ABNORMAL HIGH (ref 11.4–15.2)

## 2019-09-27 DIAGNOSIS — A4152 Sepsis due to Pseudomonas: Secondary | ICD-10-CM | POA: Diagnosis not present

## 2019-11-09 ENCOUNTER — Other Ambulatory Visit: Payer: Self-pay

## 2019-11-11 DIAGNOSIS — R531 Weakness: Secondary | ICD-10-CM | POA: Diagnosis not present

## 2019-11-14 DIAGNOSIS — Z7901 Long term (current) use of anticoagulants: Secondary | ICD-10-CM | POA: Diagnosis not present

## 2019-11-14 DIAGNOSIS — I2699 Other pulmonary embolism without acute cor pulmonale: Secondary | ICD-10-CM | POA: Diagnosis not present

## 2019-12-19 DIAGNOSIS — N3091 Cystitis, unspecified with hematuria: Secondary | ICD-10-CM | POA: Diagnosis not present

## 2019-12-20 DIAGNOSIS — Z7901 Long term (current) use of anticoagulants: Secondary | ICD-10-CM | POA: Diagnosis not present

## 2019-12-20 DIAGNOSIS — I480 Paroxysmal atrial fibrillation: Secondary | ICD-10-CM | POA: Diagnosis not present

## 2019-12-21 DIAGNOSIS — R319 Hematuria, unspecified: Secondary | ICD-10-CM | POA: Diagnosis not present

## 2019-12-21 DIAGNOSIS — N3090 Cystitis, unspecified without hematuria: Secondary | ICD-10-CM | POA: Diagnosis not present

## 2019-12-26 DIAGNOSIS — I480 Paroxysmal atrial fibrillation: Secondary | ICD-10-CM | POA: Diagnosis not present

## 2019-12-26 DIAGNOSIS — Z7901 Long term (current) use of anticoagulants: Secondary | ICD-10-CM | POA: Diagnosis not present

## 2020-01-09 DIAGNOSIS — I480 Paroxysmal atrial fibrillation: Secondary | ICD-10-CM | POA: Diagnosis not present

## 2020-01-09 DIAGNOSIS — R319 Hematuria, unspecified: Secondary | ICD-10-CM | POA: Diagnosis not present

## 2020-01-09 DIAGNOSIS — Z7901 Long term (current) use of anticoagulants: Secondary | ICD-10-CM | POA: Diagnosis not present

## 2020-01-09 DIAGNOSIS — N3090 Cystitis, unspecified without hematuria: Secondary | ICD-10-CM | POA: Diagnosis not present

## 2020-01-12 DIAGNOSIS — N3090 Cystitis, unspecified without hematuria: Secondary | ICD-10-CM | POA: Diagnosis not present

## 2020-01-16 DIAGNOSIS — Z7901 Long term (current) use of anticoagulants: Secondary | ICD-10-CM | POA: Diagnosis not present

## 2020-01-16 DIAGNOSIS — I480 Paroxysmal atrial fibrillation: Secondary | ICD-10-CM | POA: Diagnosis not present

## 2020-01-23 DIAGNOSIS — Z7901 Long term (current) use of anticoagulants: Secondary | ICD-10-CM | POA: Diagnosis not present

## 2020-01-23 DIAGNOSIS — I480 Paroxysmal atrial fibrillation: Secondary | ICD-10-CM | POA: Diagnosis not present

## 2020-02-13 DIAGNOSIS — I480 Paroxysmal atrial fibrillation: Secondary | ICD-10-CM | POA: Diagnosis not present

## 2020-02-13 DIAGNOSIS — Z7901 Long term (current) use of anticoagulants: Secondary | ICD-10-CM | POA: Diagnosis not present

## 2020-02-27 DIAGNOSIS — I480 Paroxysmal atrial fibrillation: Secondary | ICD-10-CM | POA: Diagnosis not present

## 2020-02-27 DIAGNOSIS — Z7901 Long term (current) use of anticoagulants: Secondary | ICD-10-CM | POA: Diagnosis not present

## 2020-03-05 DIAGNOSIS — I480 Paroxysmal atrial fibrillation: Secondary | ICD-10-CM | POA: Diagnosis not present

## 2020-03-05 DIAGNOSIS — Z7901 Long term (current) use of anticoagulants: Secondary | ICD-10-CM | POA: Diagnosis not present

## 2020-05-15 DIAGNOSIS — Z7901 Long term (current) use of anticoagulants: Secondary | ICD-10-CM | POA: Diagnosis not present

## 2020-05-15 DIAGNOSIS — I480 Paroxysmal atrial fibrillation: Secondary | ICD-10-CM | POA: Diagnosis not present

## 2020-05-28 DIAGNOSIS — I480 Paroxysmal atrial fibrillation: Secondary | ICD-10-CM | POA: Diagnosis not present

## 2020-05-28 DIAGNOSIS — Z7901 Long term (current) use of anticoagulants: Secondary | ICD-10-CM | POA: Diagnosis not present

## 2020-07-10 DIAGNOSIS — Z7901 Long term (current) use of anticoagulants: Secondary | ICD-10-CM | POA: Diagnosis not present

## 2020-07-10 DIAGNOSIS — Z9181 History of falling: Secondary | ICD-10-CM | POA: Diagnosis not present

## 2020-07-16 DIAGNOSIS — I480 Paroxysmal atrial fibrillation: Secondary | ICD-10-CM | POA: Diagnosis not present

## 2020-07-16 DIAGNOSIS — Z7901 Long term (current) use of anticoagulants: Secondary | ICD-10-CM | POA: Diagnosis not present

## 2020-08-09 DIAGNOSIS — Z23 Encounter for immunization: Secondary | ICD-10-CM | POA: Diagnosis not present

## 2020-09-06 DIAGNOSIS — Z23 Encounter for immunization: Secondary | ICD-10-CM | POA: Diagnosis not present

## 2020-09-19 DIAGNOSIS — R300 Dysuria: Secondary | ICD-10-CM | POA: Diagnosis not present

## 2020-09-23 DIAGNOSIS — N3090 Cystitis, unspecified without hematuria: Secondary | ICD-10-CM | POA: Diagnosis not present

## 2020-10-17 DIAGNOSIS — L03114 Cellulitis of left upper limb: Secondary | ICD-10-CM | POA: Diagnosis not present

## 2021-04-09 DIAGNOSIS — G20 Parkinson's disease: Secondary | ICD-10-CM | POA: Diagnosis not present

## 2021-04-09 DIAGNOSIS — A419 Sepsis, unspecified organism: Secondary | ICD-10-CM | POA: Diagnosis not present

## 2021-04-09 DIAGNOSIS — Z20822 Contact with and (suspected) exposure to covid-19: Secondary | ICD-10-CM | POA: Diagnosis not present

## 2021-04-09 DIAGNOSIS — R000 Tachycardia, unspecified: Secondary | ICD-10-CM | POA: Diagnosis not present

## 2021-04-09 DIAGNOSIS — J189 Pneumonia, unspecified organism: Secondary | ICD-10-CM | POA: Diagnosis not present

## 2021-04-09 DIAGNOSIS — Z6826 Body mass index (BMI) 26.0-26.9, adult: Secondary | ICD-10-CM | POA: Diagnosis not present

## 2021-04-09 DIAGNOSIS — Z931 Gastrostomy status: Secondary | ICD-10-CM | POA: Diagnosis not present

## 2021-04-09 DIAGNOSIS — T83592A Infection and inflammatory reaction due to indwelling ureteral stent, initial encounter: Secondary | ICD-10-CM | POA: Diagnosis not present

## 2021-04-09 DIAGNOSIS — Z882 Allergy status to sulfonamides status: Secondary | ICD-10-CM | POA: Diagnosis not present

## 2021-04-09 DIAGNOSIS — L89153 Pressure ulcer of sacral region, stage 3: Secondary | ICD-10-CM | POA: Diagnosis not present

## 2021-04-09 DIAGNOSIS — J9601 Acute respiratory failure with hypoxia: Secondary | ICD-10-CM | POA: Diagnosis not present

## 2021-04-09 DIAGNOSIS — L89621 Pressure ulcer of left heel, stage 1: Secondary | ICD-10-CM | POA: Diagnosis not present

## 2021-04-09 DIAGNOSIS — I5033 Acute on chronic diastolic (congestive) heart failure: Secondary | ICD-10-CM | POA: Diagnosis not present

## 2021-04-09 DIAGNOSIS — I2510 Atherosclerotic heart disease of native coronary artery without angina pectoris: Secondary | ICD-10-CM | POA: Diagnosis not present

## 2021-04-09 DIAGNOSIS — Z880 Allergy status to penicillin: Secondary | ICD-10-CM | POA: Diagnosis not present

## 2021-04-09 DIAGNOSIS — I081 Rheumatic disorders of both mitral and tricuspid valves: Secondary | ICD-10-CM | POA: Diagnosis not present

## 2021-04-09 DIAGNOSIS — R509 Fever, unspecified: Secondary | ICD-10-CM | POA: Diagnosis not present

## 2021-04-09 DIAGNOSIS — R636 Underweight: Secondary | ICD-10-CM | POA: Diagnosis not present

## 2021-04-09 DIAGNOSIS — Z792 Long term (current) use of antibiotics: Secondary | ICD-10-CM | POA: Diagnosis not present

## 2021-04-09 DIAGNOSIS — I6782 Cerebral ischemia: Secondary | ICD-10-CM | POA: Diagnosis not present

## 2021-04-09 DIAGNOSIS — Z79899 Other long term (current) drug therapy: Secondary | ICD-10-CM | POA: Diagnosis not present

## 2021-04-09 DIAGNOSIS — I6503 Occlusion and stenosis of bilateral vertebral arteries: Secondary | ICD-10-CM | POA: Diagnosis not present

## 2021-04-09 DIAGNOSIS — L8990 Pressure ulcer of unspecified site, unspecified stage: Secondary | ICD-10-CM | POA: Diagnosis not present

## 2021-04-09 DIAGNOSIS — R532 Functional quadriplegia: Secondary | ICD-10-CM | POA: Diagnosis not present

## 2021-04-09 DIAGNOSIS — R6520 Severe sepsis without septic shock: Secondary | ICD-10-CM | POA: Diagnosis not present

## 2021-04-09 DIAGNOSIS — R410 Disorientation, unspecified: Secondary | ICD-10-CM | POA: Diagnosis not present

## 2021-04-09 DIAGNOSIS — I959 Hypotension, unspecified: Secondary | ICD-10-CM | POA: Diagnosis not present

## 2021-04-09 DIAGNOSIS — E876 Hypokalemia: Secondary | ICD-10-CM | POA: Diagnosis not present

## 2021-04-09 DIAGNOSIS — N390 Urinary tract infection, site not specified: Secondary | ICD-10-CM | POA: Diagnosis not present

## 2021-04-09 DIAGNOSIS — R404 Transient alteration of awareness: Secondary | ICD-10-CM | POA: Diagnosis not present

## 2021-04-09 DIAGNOSIS — I509 Heart failure, unspecified: Secondary | ICD-10-CM | POA: Diagnosis not present

## 2021-04-09 DIAGNOSIS — R0902 Hypoxemia: Secondary | ICD-10-CM | POA: Diagnosis not present

## 2021-04-09 DIAGNOSIS — N3090 Cystitis, unspecified without hematuria: Secondary | ICD-10-CM | POA: Diagnosis not present

## 2021-04-09 DIAGNOSIS — R059 Cough, unspecified: Secondary | ICD-10-CM | POA: Diagnosis not present

## 2021-04-09 DIAGNOSIS — Z66 Do not resuscitate: Secondary | ICD-10-CM | POA: Diagnosis not present

## 2021-04-09 DIAGNOSIS — T83518A Infection and inflammatory reaction due to other urinary catheter, initial encounter: Secondary | ICD-10-CM | POA: Diagnosis not present

## 2021-04-09 DIAGNOSIS — I6521 Occlusion and stenosis of right carotid artery: Secondary | ICD-10-CM | POA: Diagnosis not present

## 2021-04-09 DIAGNOSIS — L89611 Pressure ulcer of right heel, stage 1: Secondary | ICD-10-CM | POA: Diagnosis not present

## 2021-04-09 DIAGNOSIS — I4891 Unspecified atrial fibrillation: Secondary | ICD-10-CM | POA: Diagnosis not present

## 2021-04-09 DIAGNOSIS — I110 Hypertensive heart disease with heart failure: Secondary | ICD-10-CM | POA: Diagnosis not present

## 2021-04-10 DIAGNOSIS — N390 Urinary tract infection, site not specified: Secondary | ICD-10-CM | POA: Diagnosis not present

## 2021-04-10 DIAGNOSIS — J189 Pneumonia, unspecified organism: Secondary | ICD-10-CM | POA: Diagnosis not present

## 2021-04-10 DIAGNOSIS — A419 Sepsis, unspecified organism: Secondary | ICD-10-CM | POA: Diagnosis not present

## 2021-04-11 DIAGNOSIS — G20 Parkinson's disease: Secondary | ICD-10-CM | POA: Diagnosis not present

## 2021-04-11 DIAGNOSIS — T83592A Infection and inflammatory reaction due to indwelling ureteral stent, initial encounter: Secondary | ICD-10-CM | POA: Diagnosis not present

## 2021-04-11 DIAGNOSIS — I081 Rheumatic disorders of both mitral and tricuspid valves: Secondary | ICD-10-CM | POA: Diagnosis not present

## 2021-04-11 DIAGNOSIS — I509 Heart failure, unspecified: Secondary | ICD-10-CM | POA: Diagnosis not present

## 2021-04-11 DIAGNOSIS — J189 Pneumonia, unspecified organism: Secondary | ICD-10-CM | POA: Diagnosis not present

## 2021-04-11 DIAGNOSIS — Z792 Long term (current) use of antibiotics: Secondary | ICD-10-CM | POA: Diagnosis not present

## 2021-04-11 DIAGNOSIS — A419 Sepsis, unspecified organism: Secondary | ICD-10-CM | POA: Diagnosis not present

## 2021-04-11 DIAGNOSIS — Z79899 Other long term (current) drug therapy: Secondary | ICD-10-CM | POA: Diagnosis not present

## 2021-04-11 DIAGNOSIS — N390 Urinary tract infection, site not specified: Secondary | ICD-10-CM | POA: Diagnosis not present

## 2021-04-12 DIAGNOSIS — J189 Pneumonia, unspecified organism: Secondary | ICD-10-CM | POA: Diagnosis not present

## 2021-04-12 DIAGNOSIS — I509 Heart failure, unspecified: Secondary | ICD-10-CM | POA: Diagnosis not present

## 2021-04-12 DIAGNOSIS — N390 Urinary tract infection, site not specified: Secondary | ICD-10-CM | POA: Diagnosis not present

## 2021-04-13 DIAGNOSIS — Z79899 Other long term (current) drug therapy: Secondary | ICD-10-CM | POA: Diagnosis not present

## 2021-04-13 DIAGNOSIS — I6521 Occlusion and stenosis of right carotid artery: Secondary | ICD-10-CM | POA: Diagnosis not present

## 2021-04-13 DIAGNOSIS — R410 Disorientation, unspecified: Secondary | ICD-10-CM | POA: Diagnosis not present

## 2021-04-13 DIAGNOSIS — A419 Sepsis, unspecified organism: Secondary | ICD-10-CM | POA: Diagnosis not present

## 2021-04-13 DIAGNOSIS — N390 Urinary tract infection, site not specified: Secondary | ICD-10-CM | POA: Diagnosis not present

## 2021-04-13 DIAGNOSIS — T83592A Infection and inflammatory reaction due to indwelling ureteral stent, initial encounter: Secondary | ICD-10-CM | POA: Diagnosis not present

## 2021-04-13 DIAGNOSIS — G20 Parkinson's disease: Secondary | ICD-10-CM | POA: Diagnosis not present

## 2021-04-13 DIAGNOSIS — J189 Pneumonia, unspecified organism: Secondary | ICD-10-CM | POA: Diagnosis not present

## 2021-04-13 DIAGNOSIS — I6503 Occlusion and stenosis of bilateral vertebral arteries: Secondary | ICD-10-CM | POA: Diagnosis not present

## 2021-04-13 DIAGNOSIS — I509 Heart failure, unspecified: Secondary | ICD-10-CM | POA: Diagnosis not present

## 2021-04-13 DIAGNOSIS — I6782 Cerebral ischemia: Secondary | ICD-10-CM | POA: Diagnosis not present

## 2021-04-13 DIAGNOSIS — Z792 Long term (current) use of antibiotics: Secondary | ICD-10-CM | POA: Diagnosis not present

## 2021-04-14 DIAGNOSIS — A419 Sepsis, unspecified organism: Secondary | ICD-10-CM | POA: Diagnosis not present

## 2021-04-14 DIAGNOSIS — J189 Pneumonia, unspecified organism: Secondary | ICD-10-CM | POA: Diagnosis not present

## 2021-04-14 DIAGNOSIS — T83592A Infection and inflammatory reaction due to indwelling ureteral stent, initial encounter: Secondary | ICD-10-CM | POA: Diagnosis not present

## 2021-04-14 DIAGNOSIS — G20 Parkinson's disease: Secondary | ICD-10-CM | POA: Diagnosis not present

## 2021-04-14 DIAGNOSIS — E876 Hypokalemia: Secondary | ICD-10-CM | POA: Diagnosis not present

## 2021-04-14 DIAGNOSIS — N390 Urinary tract infection, site not specified: Secondary | ICD-10-CM | POA: Diagnosis not present

## 2021-04-14 DIAGNOSIS — I509 Heart failure, unspecified: Secondary | ICD-10-CM | POA: Diagnosis not present

## 2021-04-14 DIAGNOSIS — Z792 Long term (current) use of antibiotics: Secondary | ICD-10-CM | POA: Diagnosis not present

## 2021-04-15 DIAGNOSIS — Z880 Allergy status to penicillin: Secondary | ICD-10-CM | POA: Diagnosis not present

## 2021-04-15 DIAGNOSIS — L89619 Pressure ulcer of right heel, unspecified stage: Secondary | ICD-10-CM | POA: Diagnosis not present

## 2021-04-15 DIAGNOSIS — L89629 Pressure ulcer of left heel, unspecified stage: Secondary | ICD-10-CM | POA: Diagnosis not present

## 2021-04-15 DIAGNOSIS — L89153 Pressure ulcer of sacral region, stage 3: Secondary | ICD-10-CM | POA: Diagnosis not present

## 2021-04-15 DIAGNOSIS — L89319 Pressure ulcer of right buttock, unspecified stage: Secondary | ICD-10-CM | POA: Diagnosis not present

## 2021-04-15 DIAGNOSIS — Z931 Gastrostomy status: Secondary | ICD-10-CM | POA: Diagnosis not present

## 2021-04-15 DIAGNOSIS — L89029 Pressure ulcer of left elbow, unspecified stage: Secondary | ICD-10-CM | POA: Diagnosis not present

## 2021-04-15 DIAGNOSIS — L89611 Pressure ulcer of right heel, stage 1: Secondary | ICD-10-CM | POA: Diagnosis not present

## 2021-04-15 DIAGNOSIS — Z79899 Other long term (current) drug therapy: Secondary | ICD-10-CM | POA: Diagnosis not present

## 2021-04-15 DIAGNOSIS — Z882 Allergy status to sulfonamides status: Secondary | ICD-10-CM | POA: Diagnosis not present

## 2021-04-15 DIAGNOSIS — Z792 Long term (current) use of antibiotics: Secondary | ICD-10-CM | POA: Diagnosis not present

## 2021-04-15 DIAGNOSIS — R404 Transient alteration of awareness: Secondary | ICD-10-CM | POA: Diagnosis not present

## 2021-04-15 DIAGNOSIS — L89309 Pressure ulcer of unspecified buttock, unspecified stage: Secondary | ICD-10-CM | POA: Diagnosis not present

## 2021-04-15 DIAGNOSIS — L89329 Pressure ulcer of left buttock, unspecified stage: Secondary | ICD-10-CM | POA: Diagnosis not present

## 2021-04-15 DIAGNOSIS — Z7401 Bed confinement status: Secondary | ICD-10-CM | POA: Diagnosis not present

## 2021-04-15 DIAGNOSIS — A419 Sepsis, unspecified organism: Secondary | ICD-10-CM | POA: Diagnosis not present

## 2021-04-15 DIAGNOSIS — L89899 Pressure ulcer of other site, unspecified stage: Secondary | ICD-10-CM | POA: Diagnosis not present

## 2021-04-15 DIAGNOSIS — L89019 Pressure ulcer of right elbow, unspecified stage: Secondary | ICD-10-CM | POA: Diagnosis not present

## 2021-04-15 DIAGNOSIS — I10 Essential (primary) hypertension: Secondary | ICD-10-CM | POA: Diagnosis not present

## 2021-04-15 DIAGNOSIS — R059 Cough, unspecified: Secondary | ICD-10-CM | POA: Diagnosis not present

## 2021-04-15 DIAGNOSIS — Z66 Do not resuscitate: Secondary | ICD-10-CM | POA: Diagnosis not present

## 2021-04-15 DIAGNOSIS — I2510 Atherosclerotic heart disease of native coronary artery without angina pectoris: Secondary | ICD-10-CM | POA: Diagnosis not present

## 2021-04-15 DIAGNOSIS — N390 Urinary tract infection, site not specified: Secondary | ICD-10-CM | POA: Diagnosis not present

## 2021-04-15 DIAGNOSIS — R4182 Altered mental status, unspecified: Secondary | ICD-10-CM | POA: Diagnosis not present

## 2021-04-15 DIAGNOSIS — R000 Tachycardia, unspecified: Secondary | ICD-10-CM | POA: Diagnosis not present

## 2021-04-15 DIAGNOSIS — J90 Pleural effusion, not elsewhere classified: Secondary | ICD-10-CM | POA: Diagnosis not present

## 2021-04-15 DIAGNOSIS — Z20822 Contact with and (suspected) exposure to covid-19: Secondary | ICD-10-CM | POA: Diagnosis not present

## 2021-04-15 DIAGNOSIS — Z7951 Long term (current) use of inhaled steroids: Secondary | ICD-10-CM | POA: Diagnosis not present

## 2021-04-15 DIAGNOSIS — R5381 Other malaise: Secondary | ICD-10-CM | POA: Diagnosis not present

## 2021-04-15 DIAGNOSIS — L89159 Pressure ulcer of sacral region, unspecified stage: Secondary | ICD-10-CM | POA: Diagnosis not present

## 2021-04-15 DIAGNOSIS — G40909 Epilepsy, unspecified, not intractable, without status epilepticus: Secondary | ICD-10-CM | POA: Diagnosis not present

## 2021-04-15 DIAGNOSIS — Z7901 Long term (current) use of anticoagulants: Secondary | ICD-10-CM | POA: Diagnosis not present

## 2021-04-15 DIAGNOSIS — G20 Parkinson's disease: Secondary | ICD-10-CM | POA: Diagnosis not present

## 2021-04-15 DIAGNOSIS — R569 Unspecified convulsions: Secondary | ICD-10-CM | POA: Diagnosis not present

## 2021-04-15 DIAGNOSIS — J189 Pneumonia, unspecified organism: Secondary | ICD-10-CM | POA: Diagnosis not present

## 2021-04-15 DIAGNOSIS — I4891 Unspecified atrial fibrillation: Secondary | ICD-10-CM | POA: Diagnosis not present

## 2021-04-15 DIAGNOSIS — L89621 Pressure ulcer of left heel, stage 1: Secondary | ICD-10-CM | POA: Diagnosis not present

## 2021-04-16 DIAGNOSIS — Z66 Do not resuscitate: Secondary | ICD-10-CM | POA: Diagnosis not present

## 2021-04-16 DIAGNOSIS — R569 Unspecified convulsions: Secondary | ICD-10-CM | POA: Diagnosis not present

## 2021-04-16 DIAGNOSIS — R059 Cough, unspecified: Secondary | ICD-10-CM | POA: Diagnosis not present

## 2021-04-16 DIAGNOSIS — J90 Pleural effusion, not elsewhere classified: Secondary | ICD-10-CM | POA: Diagnosis not present

## 2021-04-17 DIAGNOSIS — L89629 Pressure ulcer of left heel, unspecified stage: Secondary | ICD-10-CM | POA: Diagnosis not present

## 2021-04-17 DIAGNOSIS — N390 Urinary tract infection, site not specified: Secondary | ICD-10-CM | POA: Diagnosis not present

## 2021-04-17 DIAGNOSIS — L89899 Pressure ulcer of other site, unspecified stage: Secondary | ICD-10-CM | POA: Diagnosis not present

## 2021-04-17 DIAGNOSIS — G20 Parkinson's disease: Secondary | ICD-10-CM | POA: Diagnosis not present

## 2021-04-17 DIAGNOSIS — J189 Pneumonia, unspecified organism: Secondary | ICD-10-CM | POA: Diagnosis not present

## 2021-04-17 DIAGNOSIS — A419 Sepsis, unspecified organism: Secondary | ICD-10-CM | POA: Diagnosis not present

## 2021-04-17 DIAGNOSIS — L89309 Pressure ulcer of unspecified buttock, unspecified stage: Secondary | ICD-10-CM | POA: Diagnosis not present

## 2021-04-17 DIAGNOSIS — R569 Unspecified convulsions: Secondary | ICD-10-CM | POA: Diagnosis not present

## 2021-04-17 DIAGNOSIS — Z931 Gastrostomy status: Secondary | ICD-10-CM | POA: Diagnosis not present

## 2021-04-17 DIAGNOSIS — I4891 Unspecified atrial fibrillation: Secondary | ICD-10-CM | POA: Diagnosis not present

## 2021-04-17 DIAGNOSIS — L89619 Pressure ulcer of right heel, unspecified stage: Secondary | ICD-10-CM | POA: Diagnosis not present

## 2021-04-17 DIAGNOSIS — L89159 Pressure ulcer of sacral region, unspecified stage: Secondary | ICD-10-CM | POA: Diagnosis not present

## 2021-04-18 DIAGNOSIS — Z792 Long term (current) use of antibiotics: Secondary | ICD-10-CM | POA: Diagnosis not present

## 2021-04-18 DIAGNOSIS — Z79899 Other long term (current) drug therapy: Secondary | ICD-10-CM | POA: Diagnosis not present

## 2021-04-18 DIAGNOSIS — J189 Pneumonia, unspecified organism: Secondary | ICD-10-CM | POA: Diagnosis not present

## 2021-04-18 DIAGNOSIS — N390 Urinary tract infection, site not specified: Secondary | ICD-10-CM | POA: Diagnosis not present

## 2021-04-18 DIAGNOSIS — R569 Unspecified convulsions: Secondary | ICD-10-CM | POA: Diagnosis not present

## 2021-05-02 DIAGNOSIS — N390 Urinary tract infection, site not specified: Secondary | ICD-10-CM | POA: Diagnosis not present

## 2021-05-04 DIAGNOSIS — N390 Urinary tract infection, site not specified: Secondary | ICD-10-CM | POA: Diagnosis not present

## 2021-05-04 DIAGNOSIS — T83511A Infection and inflammatory reaction due to indwelling urethral catheter, initial encounter: Secondary | ICD-10-CM | POA: Diagnosis not present

## 2021-05-08 DIAGNOSIS — T83518A Infection and inflammatory reaction due to other urinary catheter, initial encounter: Secondary | ICD-10-CM | POA: Diagnosis not present

## 2021-05-16 ENCOUNTER — Other Ambulatory Visit: Payer: Self-pay

## 2021-05-16 ENCOUNTER — Encounter: Payer: Self-pay | Admitting: Urology

## 2021-05-16 ENCOUNTER — Ambulatory Visit (INDEPENDENT_AMBULATORY_CARE_PROVIDER_SITE_OTHER): Payer: No Typology Code available for payment source | Admitting: Urology

## 2021-05-16 VITALS — BP 92/63 | HR 84 | Temp 98.4°F | Ht 74.0 in

## 2021-05-16 DIAGNOSIS — R339 Retention of urine, unspecified: Secondary | ICD-10-CM

## 2021-05-16 DIAGNOSIS — Z435 Encounter for attention to cystostomy: Secondary | ICD-10-CM | POA: Diagnosis not present

## 2021-05-16 DIAGNOSIS — N3020 Other chronic cystitis without hematuria: Secondary | ICD-10-CM | POA: Diagnosis not present

## 2021-05-16 NOTE — Progress Notes (Signed)
Urological Symptom Review  Patient is experiencing the following symptoms: Blood in urine Urinary tract infection   Review of Systems  Gastrointestinal (upper)  : Negative for upper GI symptoms  Gastrointestinal (lower) : Negative for lower GI symptoms  Constitutional : Negative for symptoms  Skin: Negative for skin symptoms  Eyes: Negative for eye symptoms  Ear/Nose/Throat : Negative for Ear/Nose/Throat symptoms  Hematologic/Lymphatic: Negative for Hematologic/Lymphatic symptoms  Cardiovascular : Negative for cardiovascular symptoms  Respiratory : Negative for respiratory symptoms  Endocrine: Negative for endocrine symptoms  Musculoskeletal: Negative for musculoskeletal symptoms  Neurological: Negative for neurological symptoms  Psychologic: Negative for psychiatric symptoms

## 2021-05-16 NOTE — Progress Notes (Signed)
Suprapubic Cath Change  Patient is present today for a suprapubic catheter change due to urinary retention.  61ml of water was drained from the balloon, a 67EH silicone foley cath was removed from the tract with out difficulty.  Site was cleaned and prepped in a sterile fashion with betadine.  A 16 silicone FR foley cath was replaced into the tract no complications were noted. Urine return was noted, 10 ml of sterile water was inflated into the balloon and a bedside bag was attached for drainage.  Patient tolerated well. A night bag was given to patient and proper instruction was given on how to switch bags.    Preformed by: Estill Bamberg RN  Follow up: 1 month and will increase size to 20N silicone catheter per Dr. Jeffie Pollock

## 2021-05-16 NOTE — Progress Notes (Signed)
Subjective: 1. Urinary retention   2. Chronic cystitis   3. Attention to cystostomy [facility])      Consult requested by Dr. Gar Ponto  Cory Pacheco is a 75 yo male who had a SP tube placed at the [facility] earlier this year and it was upsized to a 31fr in April but he hasn't had it changed since.  He has a history of retention with recurrent UTI's and sepsis.  He had a CT earlier this year and didn't have stones.   He has a history of Parkinson's disease and Seizure.  He has no other GU history.   ROS:  ROS  Allergies  Allergen Reactions  . Penicillins Hives  . Bupropion Other (See Comments)    Patient not sure  . Metformin Other (See Comments)    Patient not sure  . Sulfa Antibiotics Other (See Comments)    Patient not sure    Past Medical History:  Diagnosis Date  . Anxiety   . Arthritis   . Depression   . Hypertension   . Seizure [facility])     Past Surgical History:  Procedure Laterality Date  . CHOLECYSTECTOMY    . COLONOSCOPY    . COLONOSCOPY N/A 08/03/2013   Procedure: COLONOSCOPY;  Surgeon: Rogene Houston, MD;  Location: [facility];  Service: Endoscopy;  Laterality: N/A;  930  . TONSILLECTOMY      Social History   Socioeconomic History  . Marital status: Married    Spouse name: Not on file  . Number of children: Not on file  . Years of education: Not on file  . Highest education level: Not on file  Occupational History  . Not on file  Tobacco Use  . Smoking status: Never Smoker  . Smokeless tobacco: Never Used  Substance and Sexual Activity  . Alcohol use: Not on file  . Drug use: Not on file  . Sexual activity: Yes  Other Topics Concern  . Not on file  Social History Narrative  . Not on file   Social Determinants of Health   Financial Resource Strain: Not on file  Food Insecurity: Not on file  Transportation Needs: Not on file  Physical Activity: Not on file  Stress: Not on file  Social Connections: Not on file  Intimate Partner  Violence: Not on file    Family History  Problem Relation Age of Onset  . Sudden death Neg Hx   . Sickle cell trait Neg Hx   . Lupus Neg Hx   . Anesthesia problems Neg Hx     Anti-infectives: Anti-infectives (From admission, onward)   None      Current Outpatient Medications  Medication Sig Dispense Refill  . acetaminophen (TYLENOL) 325 MG tablet Take 2 tablets (650 mg total) by mouth every 6 (six) hours as needed for mild pain (temp > 101.5).    Marland Kitchen albuterol (PROVENTIL HFA;VENTOLIN HFA) 108 (90 Base) MCG/ACT inhaler Inhale 2 puffs into the lungs every 6 (six) hours as needed for wheezing or shortness of breath.    . ALPRAZolam (XANAX) 0.25 MG tablet Take 1 tablet (0.25 mg total) by mouth 2 (two) times daily. 10 tablet 0  . amLODipine (NORVASC) 5 MG tablet Take 2.5 mg by mouth daily.     Marland Kitchen aspirin EC 81 MG tablet Take 81 mg by mouth daily.    . Calcium Citrate (CITRACAL PO) Take 1 tablet by mouth daily.    . Cholecalciferol (VITAMIN D3) 2000 UNITS TABS  Take 1 tablet by mouth daily.    . divalproex (DEPAKOTE) 500 MG DR tablet Take 1 tablet (500 mg total) by mouth 3 (three) times daily.    . folic acid (FOLVITE) 1 MG tablet Take 2 mg by mouth daily.    Marland Kitchen levETIRAcetam (KEPPRA) 500 MG tablet Take 1 tablet (500 mg total) by mouth 2 (two) times daily.    . Multiple Vitamin (MULTIVITAMIN) LIQD Take 15 mLs by mouth daily.    Marland Kitchen omeprazole (PRILOSEC) 20 MG capsule Take 20 mg by mouth 2 (two) times daily.    . potassium chloride SA (K-DUR,KLOR-CON) 20 MEQ tablet Take 20 mEq by mouth daily.    . pravastatin (PRAVACHOL) 40 MG tablet Take 40 mg by mouth at bedtime.     . sertraline (ZOLOFT) 50 MG tablet Take 75 mg by mouth daily.     No current facility-administered medications for this visit.     Objective: Vital signs in last 24 hours: BP 92/63   Pulse 84   Temp 98.4 F (36.9 C) (Oral)   Ht 6\' 2"  (1.88 m)   BMI 31.58 kg/m   Intake/Output from previous day: No intake/output data  recorded. Intake/Output this shift: @IOTHISSHIFT @   Physical Exam Vitals reviewed.  Constitutional:      Comments: Wheelchair bound and non-communicative.   Abdominal:     Comments: Soft, obese with a clean SP tube site in the lower abdomen.      Lab Results:  No results found for this or any previous visit (from the past 24 hour(s)).  BMET No results for input(s): NA, K, CL, CO2, GLUCOSE, BUN, CREATININE, CALCIUM in the last 72 hours. PT/INR No results for input(s): LABPROT, INR in the last 72 hours. ABG No results for input(s): PHART, HCO3 in the last 72 hours.  Invalid input(s): PCO2, PO2  Studies[facility]Results[facility] and [facility] results.    Assessment/Plan: Urinary retention with recurrent sepsis.   SP tube replaced.   He will return monthly for tube changes and will be up sized to an 18 fr at the next visit.   No orders of the defined types were placed in this encounter.    No orders of the defined types were placed in this encounter.    Return in about 1 month (around 06/15/2021) for He needs monthly f/u for SP tube change. .    CC: Dr. Gar Ponto and [facility] (Dr. Dene Gentry in Urology).      Irine Seal 05/16/2021 [phone]

## 2021-05-18 DIAGNOSIS — J189 Pneumonia, unspecified organism: Secondary | ICD-10-CM | POA: Diagnosis not present

## 2021-05-18 DIAGNOSIS — N390 Urinary tract infection, site not specified: Secondary | ICD-10-CM | POA: Diagnosis not present

## 2021-05-18 DIAGNOSIS — G40909 Epilepsy, unspecified, not intractable, without status epilepticus: Secondary | ICD-10-CM | POA: Diagnosis not present

## 2021-05-22 ENCOUNTER — Other Ambulatory Visit (HOSPITAL_COMMUNITY): Payer: Self-pay | Admitting: Specialist

## 2021-05-22 DIAGNOSIS — T17908S Unspecified foreign body in respiratory tract, part unspecified causing other injury, sequela: Secondary | ICD-10-CM

## 2021-05-24 ENCOUNTER — Ambulatory Visit (HOSPITAL_COMMUNITY): Payer: PPO | Admitting: Speech Pathology

## 2021-05-27 ENCOUNTER — Telehealth: Payer: Self-pay | Admitting: Urology

## 2021-05-27 NOTE — Telephone Encounter (Signed)
Spoke with Ms. Wainright, wife states she noticed blood in and around sp tube after patient had therapy. She states that bleeding has stopped and that patient is doing much better.  Wife states that patient will void from penis periodically which is new.

## 2021-05-27 NOTE — Telephone Encounter (Signed)
Pt's wife called on Friday 05/24/21 and stated that her husband was bleeding from his pelvic catheter. Please call patient when you can.

## 2021-05-28 ENCOUNTER — Other Ambulatory Visit (HOSPITAL_COMMUNITY)
Admission: RE | Admit: 2021-05-28 | Discharge: 2021-05-28 | Disposition: A | Discharge status: Home / Self Care | Payer: PPO | Source: Other Acute Inpatient Hospital | Attending: Urology | Admitting: Urology

## 2021-05-28 ENCOUNTER — Telehealth: Payer: Self-pay

## 2021-05-28 DIAGNOSIS — N390 Urinary tract infection, site not specified: Secondary | ICD-10-CM | POA: Insufficient documentation

## 2021-05-28 LAB — URINALYSIS, COMPLETE (UACMP) WITH MICROSCOPIC
Bilirubin Urine: NEGATIVE
Glucose, UA: NEGATIVE mg/dL
Ketones, ur: NEGATIVE mg/dL
Nitrite: NEGATIVE
Protein, ur: 100 mg/dL — AB
RBC / HPF: >50 RBC/hpf — ABNORMAL HIGH (ref 0–5)
Specific Gravity, Urine: 1.008 (ref 1.005–1.030)
pH: 7 (ref 5.0–8.0)

## 2021-05-28 NOTE — Telephone Encounter (Signed)
Cory Pacheco from [place] given V.O. to obtain urine specimen. Fax number given.

## 2021-05-29 ENCOUNTER — Ambulatory Visit (HOSPITAL_COMMUNITY)
Admission: RE | Admit: 2021-05-29 | Discharge: 2021-05-29 | Disposition: A | Discharge status: Home / Self Care | Payer: PPO | Source: Ambulatory Visit | Attending: Family Medicine | Admitting: Family Medicine

## 2021-05-29 ENCOUNTER — Other Ambulatory Visit: Payer: Self-pay

## 2021-05-29 ENCOUNTER — Ambulatory Visit (HOSPITAL_COMMUNITY): Payer: No Typology Code available for payment source | Attending: Family Medicine | Admitting: Speech Pathology

## 2021-05-29 ENCOUNTER — Encounter (HOSPITAL_COMMUNITY): Payer: Self-pay | Admitting: Speech Pathology

## 2021-05-29 DIAGNOSIS — R1312 Dysphagia, oropharyngeal phase: Secondary | ICD-10-CM | POA: Insufficient documentation

## 2021-05-29 DIAGNOSIS — T17908S Unspecified foreign body in respiratory tract, part unspecified causing other injury, sequela: Secondary | ICD-10-CM

## 2021-05-29 DIAGNOSIS — R1310 Dysphagia, unspecified: Secondary | ICD-10-CM | POA: Diagnosis not present

## 2021-05-29 DIAGNOSIS — T17320A Food in larynx causing asphyxiation, initial encounter: Secondary | ICD-10-CM | POA: Diagnosis not present

## 2021-05-29 NOTE — Therapy (Signed)
valleculae;Pharyngeal residue - pyriform;Lateral channel residue  Pharyngeal Material enters airway, passes BELOW cords without attempt by patient to eject out (silent aspiration)  Pharyngeal- Thin Straw NT  Pharyngeal --  Pharyngeal- Puree Delayed swallow initiation-vallecula;Reduced laryngeal elevation;Reduced epiglottic inversion;Reduced pharyngeal peristalsis;Pharyngeal residue - valleculae;Pharyngeal residue - pyriform  Pharyngeal Material does not enter airway  Pharyngeal- Mechanical Soft NT  Pharyngeal --  Pharyngeal- Regular Delayed swallow initiation-vallecula;Reduced laryngeal elevation;Reduced epiglottic inversion;Reduced pharyngeal peristalsis;Pharyngeal residue - valleculae;Pharyngeal residue - pyriform  Pharyngeal Material does not enter airway  Pharyngeal- Multi-consistency NT  Pharyngeal --  Pharyngeal- Pill Pharyngeal residue - valleculae;Delayed swallow initiation-vallecula;Reduced pharyngeal peristalsis;Reduced  epiglottic inversion;Reduced tongue base retraction  Pharyngeal --  Pharyngeal Comment --     CHL IP CERVICAL ESOPHAGEAL PHASE 05/12/2017  Cervical Esophageal Phase WFL  Pudding Teaspoon --  Pudding Cup --  Honey Teaspoon --  Honey Cup --  Nectar Teaspoon --  Nectar Cup --  Nectar Straw --  Thin Teaspoon --  Thin Cup --  Thin Straw --  Puree --  Mechanical Soft --  Regular --  Multi-consistency --  Pill --  Cervical Esophageal Comment --     Wende Bushy 05/29/2021, 5:06 PM                           Patient will benefit from skilled therapeutic intervention in order to improve the following deficits and impairments:   Oropharyngeal dysphagia        Problem List Patient Active Problem List   Diagnosis Date Noted   Pressure injury of skin 05/06/2017   Encounter for orogastric (OG) tube placement    Seizure ([place]) 05/05/2017   Status epilepticus ([place])    Acute respiratory failure ([place])    Madison Direnzo H. Roddie Mc, CCC-SLP Speech Language Pathologist  Wende Bushy 05/29/2021, 5:06 PM  [facility] [address] [place], [place], [address] Phone: [phone]   Fax:  [phone]  Name: Cory Pacheco MRN: [id number] Date of Birth: [birth year]  [place], [place], [address] Phone: [phone]   Fax:  [phone]  Modified Barium Swallow  Patient Details  Name: Cory Pacheco MRN: [id number] Date of Birth: [birth year] No data recorded  Encounter Date: 05/29/2021   End of Session - 05/29/21 1705     Visit Number 1    Number of Visits 1           Past Medical History:  Past Medical History:  Diagnosis Date   Anxiety    Arthritis    Depression    Hypertension    Seizure ([place])    Past Surgical History:  Past Surgical History:  Procedure Laterality Date   CHOLECYSTECTOMY     COLONOSCOPY     COLONOSCOPY N/A 08/03/2013   Procedure: COLONOSCOPY;  Surgeon: Rogene Houston, MD;  [facility];  Service: Endoscopy;  Laterality: N/A;  76   TONSILLECTOMY     HPI: Mr. Rogus is a 75 yo male who had a SP tube placed at the [facility] earlier this year and it was upsized to a 36fr in April but he hasn't had it changed since.  He has a history of retention with recurrent UTI's and sepsis.  He had a CT earlier this year and didn't have stones.  He has a history of Parkinson's disease and Seizure. Pt was referred for [place] ~ 6 weeks ago and MBSS was requested from [facility] SLP; Pt has been strictly NPO since SP tube placement in February of 2022.   No data recorded   Assessment / Plan / Recommendation  CHL IP CLINICAL IMPRESSIONS 05/29/2021  Clinical Impression Pt presents with moderate/severe sensorimotor oropharyngeal dysphagia characterized by silent aspiration of thin liquids and NTL and mild/moderate pharyngeal residue across consistencies. Swallowing is characterized by prolonged oral prep stage, decreased BOT retraction, reduced pharyngeal peristalsis, reduced epiglottic inversion, and decreased laryngeal vestibule closure. Pt consumed puree and regular textures with min to mod valleculae and pyriform resiude that was minimally cleared by an  additional reflexive or cued swallow however additional bites/bolus presentations facilitated clearance of earlier presentations. With tsp sips of HTL only trace penetration was occasionally visualized and was cleared with a strong cough; Pt was unable to follow commands to cough but would produce a cough when SLP provided a model of a strong cough. Barium tablet was administered with brief stasis in the valleculae that was cleared with multiple presentations of puree following. Secondary to Pt's mentation limiting Pt's ability to follow commands SLP was unable to trial various strategies and compensatory techniques to attempt to minimize or decrease penetration/aspiration; this is further compounded by decreased neck mobility. Recommend initiate D3/mech soft diet and HTL with strategies re: small bites/sips, cough throughout meal and after every sip followed by a repeat swallow (repeat swallow was reflexive after cough during assessment). Pt will benefit from continued [facility] for strengthening and to ensure usage of compensatory strategies. Further recommend free water protocol with SLP. Recommend repeat MBSS when clinically appropriate for liquid upgrade.Reviewed results with Pt's wife following the study and via phone reviewed with [facility] treating SLP.  SLP Visit Diagnosis Dysphagia, unspecified (R13.10)  Attention and concentration deficit following --  Frontal lobe and executive function deficit following --  Impact on safety and function Severe aspiration risk      CHL IP TREATMENT RECOMMENDATION 05/29/2021  Treatment Recommendations Therapy as outlined in treatment plan below     Prognosis 05/12/2017  valleculae;Pharyngeal residue - pyriform;Lateral channel residue  Pharyngeal Material enters airway, passes BELOW cords without attempt by patient to eject out (silent aspiration)  Pharyngeal- Thin Straw NT  Pharyngeal --  Pharyngeal- Puree Delayed swallow initiation-vallecula;Reduced laryngeal elevation;Reduced epiglottic inversion;Reduced pharyngeal peristalsis;Pharyngeal residue - valleculae;Pharyngeal residue - pyriform  Pharyngeal Material does not enter airway  Pharyngeal- Mechanical Soft NT  Pharyngeal --  Pharyngeal- Regular Delayed swallow initiation-vallecula;Reduced laryngeal elevation;Reduced epiglottic inversion;Reduced pharyngeal peristalsis;Pharyngeal residue - valleculae;Pharyngeal residue - pyriform  Pharyngeal Material does not enter airway  Pharyngeal- Multi-consistency NT  Pharyngeal --  Pharyngeal- Pill Pharyngeal residue - valleculae;Delayed swallow initiation-vallecula;Reduced pharyngeal peristalsis;Reduced  epiglottic inversion;Reduced tongue base retraction  Pharyngeal --  Pharyngeal Comment --     CHL IP CERVICAL ESOPHAGEAL PHASE 05/12/2017  Cervical Esophageal Phase WFL  Pudding Teaspoon --  Pudding Cup --  Honey Teaspoon --  Honey Cup --  Nectar Teaspoon --  Nectar Cup --  Nectar Straw --  Thin Teaspoon --  Thin Cup --  Thin Straw --  Puree --  Mechanical Soft --  Regular --  Multi-consistency --  Pill --  Cervical Esophageal Comment --     Wende Bushy 05/29/2021, 5:06 PM                           Patient will benefit from skilled therapeutic intervention in order to improve the following deficits and impairments:   Oropharyngeal dysphagia        Problem List Patient Active Problem List   Diagnosis Date Noted   Pressure injury of skin 05/06/2017   Encounter for orogastric (OG) tube placement    Seizure ([place]) 05/05/2017   Status epilepticus ([place])    Acute respiratory failure ([place])    Madison Direnzo H. Roddie Mc, CCC-SLP Speech Language Pathologist  Wende Bushy 05/29/2021, 5:06 PM  [facility] [address] [place], [place], [address] Phone: [phone]   Fax:  [phone]  Name: Cory Pacheco MRN: [id number] Date of Birth: [birth year]

## 2021-05-30 ENCOUNTER — Telehealth: Payer: Self-pay

## 2021-05-30 ENCOUNTER — Other Ambulatory Visit: Payer: Self-pay

## 2021-05-30 LAB — URINE CULTURE: Culture: NO GROWTH

## 2021-05-31 ENCOUNTER — Ambulatory Visit (HOSPITAL_COMMUNITY): Payer: PPO | Admitting: Speech Pathology

## 2021-05-31 NOTE — Telephone Encounter (Signed)
See other note

## 2021-05-31 NOTE — Telephone Encounter (Signed)
Wife called and made aware. 

## 2021-06-03 ENCOUNTER — Other Ambulatory Visit: Payer: Self-pay

## 2021-06-03 ENCOUNTER — Telehealth: Payer: Self-pay

## 2021-06-03 NOTE — Telephone Encounter (Signed)
-----   Message from Irine Seal, MD sent at 05/30/2021  4:42 PM EDT ----- Culture is no growth. ----- Message ----- From: Dorisann Frames, RN Sent: 05/30/2021   2:19 PM EDT To: Irine Seal, MD, [facility], LPN  Urine culture from [facility]

## 2021-06-03 NOTE — Telephone Encounter (Signed)
Patient wife notified.

## 2021-06-03 NOTE — Telephone Encounter (Signed)
Wife called to notify office suprapubic catheter site still has intermittent bleeding from site. Patient is eliquis, recently had urine culture result done by Home Health negative.  Wife states the bleeding has subsided this am but wanted MD to be aware.  Message sent to MD.

## 2021-06-05 ENCOUNTER — Encounter (HOSPITAL_COMMUNITY): Payer: Non-veteran care | Admitting: Speech Pathology

## 2021-06-10 ENCOUNTER — Other Ambulatory Visit (HOSPITAL_COMMUNITY): Payer: Non-veteran care

## 2021-06-10 ENCOUNTER — Encounter (HOSPITAL_COMMUNITY): Payer: Non-veteran care | Admitting: Speech Pathology

## 2021-06-10 ENCOUNTER — Ambulatory Visit (HOSPITAL_COMMUNITY): Payer: PPO | Admitting: Speech Pathology

## 2021-06-14 ENCOUNTER — Ambulatory Visit (INDEPENDENT_AMBULATORY_CARE_PROVIDER_SITE_OTHER): Payer: PPO | Admitting: Urology

## 2021-06-14 ENCOUNTER — Other Ambulatory Visit: Payer: Self-pay

## 2021-06-14 DIAGNOSIS — R339 Retention of urine, unspecified: Secondary | ICD-10-CM | POA: Diagnosis not present

## 2021-06-14 NOTE — Progress Notes (Signed)
Patient came in today for SP tube assessment after home health nurse was unable to flush and per wife no urine had been draining in bag.  Catheter tubing was full of sediment.  Attempted to remove a 32fsilicone catheter but met great resistance.  Consulted Dr. MAlyson Ingleswho came to remove and change patient catheter.  Please see MD note.

## 2021-06-18 ENCOUNTER — Encounter: Payer: Self-pay | Admitting: Urology

## 2021-06-18 NOTE — Progress Notes (Signed)
06/14/2021 10:21 AM   Cory Pacheco 15-Jan-1946 [id number]  Referring provider: Wayne Both, MD [address] [place],  [place] [address]  Urinary retention   HPI: Mr Cory Pacheco is a 74yo with a history of urinary retention managed with an SP tube. His last SP tube change was 4 weeks ago and his caretaker noticed last night his tube had stopped draining. He was having urinary incontinence per urethra. NO hematuria. No other complaints today   PMH: Past Medical History:  Diagnosis Date   Anxiety    Arthritis    Depression    Hypertension    Seizure [facility])     Surgical History: Past Surgical History:  Procedure Laterality Date   CHOLECYSTECTOMY     COLONOSCOPY     COLONOSCOPY N/A 08/03/2013   Procedure: COLONOSCOPY;  Surgeon: Malissa Hippo, MD;  [facility];  Service: Endoscopy;  Laterality: N/A;  930   TONSILLECTOMY      Home Medications:  Allergies as of 06/14/2021       Reactions   Penicillins Hives   Bupropion Other (See Comments)   Patient not sure   Metformin Other (See Comments)   Patient not sure   Sulfa Antibiotics Other (See Comments)   Patient not sure        Medication List        Accurate as of June 14, 2021 11:59 PM. If you have any questions, ask your nurse or doctor.          acetaminophen 325 MG tablet Commonly known as: TYLENOL Take 2 tablets (650 mg total) by mouth every 6 (six) hours as needed for mild pain (temp > 101.5).   albuterol 108 (90 Base) MCG/ACT inhaler Commonly known as: VENTOLIN HFA Inhale 2 puffs into the lungs every 6 (six) hours as needed for wheezing or shortness of breath.   ALPRAZolam 0.25 MG tablet Commonly known as: XANAX Take 1 tablet (0.25 mg total) by mouth 2 (two) times daily.   amLODipine 5 MG tablet Commonly known as: NORVASC Take 2.5 mg by mouth daily.   apixaban 5 MG Tabs tablet Commonly known as: ELIQUIS by Does not apply route.   aspirin EC 81 MG tablet Take 81 mg by mouth  daily.   CITRACAL PO Take 1 tablet by mouth daily.   divalproex 500 MG DR tablet Commonly known as: Depakote Take 1 tablet (500 mg total) by mouth 3 (three) times daily.   folic acid 1 MG tablet Commonly known as: FOLVITE Take 2 mg by mouth daily.   levETIRAcetam 500 MG tablet Commonly known as: Keppra Take 1 tablet (500 mg total) by mouth 2 (two) times daily.   multivitamin Liqd Take 15 mLs by mouth daily.   omeprazole 20 MG capsule Commonly known as: PRILOSEC Take 20 mg by mouth 2 (two) times daily.   potassium chloride SA 20 MEQ tablet Commonly known as: KLOR-CON Take 20 mEq by mouth daily.   pravastatin 40 MG tablet Commonly known as: PRAVACHOL Take 40 mg by mouth at bedtime.   sertraline 50 MG tablet Commonly known as: ZOLOFT Take 75 mg by mouth daily.   Vitamin D3 50 MCG (2000 UT) Tabs Take 1 tablet by mouth daily.        Allergies:  Allergies  Allergen Reactions   Penicillins Hives   Bupropion Other (See Comments)    Patient not sure   Metformin Other (See Comments)    Patient not sure  Sulfa Antibiotics Other (See Comments)    Patient not sure    Family History: Family History  Problem Relation Age of Onset   Sudden death Neg Hx    Sickle cell trait Neg Hx    Lupus Neg Hx    Anesthesia problems Neg Hx     Social History:  reports that he has never smoked. He has never used smokeless tobacco. No history on file for alcohol use and drug use.  ROS: All other review of systems were reviewed and are negative except what is noted above in HPI  Physical Exam: There were no vitals taken for this visit.  Constitutional:  Alert and oriented, No acute distress. HEENT: [place] AT, moist mucus membranes.  Trachea midline, no masses. Cardiovascular: No clubbing, cyanosis, or edema. Respiratory: Normal respiratory effort, no increased work of breathing. GI: Abdomen is soft, nontender, nondistended, no abdominal masses GU: No CVA tenderness.  Lymph: No  cervical or inguinal lymphadenopathy. Skin: No rashes, bruises or suspicious lesions. Neurologic: Grossly intact, no focal deficits, moving all 4 extremities. Psychiatric: Normal mood and affect.  Laboratory Data: Lab Results  Component Value Date   WBC 6.7 05/09/2017   HGB 14.0 05/09/2017   HCT 39.7 05/09/2017   MCV 93.6 05/09/2017   PLT 210 05/09/2017    Lab Results  Component Value Date   CREATININE 0.80 05/09/2017    No results found for: PSA  No results found for: TESTOSTERONE  No results found for: HGBA1C  Urinalysis    Component Value Date/Time   COLORURINE AMBER (A) 05/28/2021 1631   APPEARANCEUR HAZY (A) 05/28/2021 1631   LABSPEC 1.008 05/28/2021 1631   PHURINE 7.0 05/28/2021 1631   GLUCOSEU NEGATIVE 05/28/2021 1631   HGBUR MODERATE (A) 05/28/2021 1631   BILIRUBINUR NEGATIVE 05/28/2021 1631   KETONESUR NEGATIVE 05/28/2021 1631   PROTEINUR 100 (A) 05/28/2021 1631   NITRITE NEGATIVE 05/28/2021 1631   LEUKOCYTESUR MODERATE (A) 05/28/2021 1631    Lab Results  Component Value Date   BACTERIA RARE (A) 05/28/2021    Pertinent Imaging:  No results found for this or any previous visit.  No results found for this or any previous visit.  No results found for this or any previous visit.  No results found for this or any previous visit.  No results found for this or any previous visit.  No results found for this or any previous visit.  No results found for this or any previous visit.  No results found for this or any previous visit.   Assessment & Plan:    SP tube malfunction -SP tube removed and a new 16 French SP tube was placed over a sensor wire. Patient should return in 2 week for SP tube upsizing.  No follow-ups on file.  Wilkie Aye, MD  [facility] [place]

## 2021-06-18 NOTE — Patient Instructions (Signed)
Suprapubic Catheter Replacement  Suprapubic catheter replacement is a procedure to remove an old catheter and insert a new, clean catheter. A suprapubic catheter is a rubber tube that drains urine from the bladder into a collection bag outside the body. The catheter is inserted into the bladder through a small opening in the lower abdomen, near the center of the body, above the pubic bone (suprapubicarea). There is a tiny balloon filled with germ-free (sterile) water on the end of the catheter that is in the bladder. The balloon helps tokeep the catheter in place. If you need to wear a catheter for a long period of time, you may be instructed to replace the catheter yourself. Usually, suprapubic catheters need to bereplaced every 4-6 weeks, or as often as told by your health care provider. What are the risks? Generally, this is a safe procedure. However, problems may occur, includingfailure to get the catheter into the bladder. What happens before the procedure? You may have an exam or testing, including a blood or urine sample. Ask your health care provider what steps will be taken to help prevent infection. What happens during the procedure? You will lie on your back. The water from the balloon will be removed using a syringe. The catheter will be slowly removed. Lubricant will be applied to the end of the new catheter that will go into your bladder. The new catheter will be inserted through the opening in your abdomen. Your health care provider will slide the catheter into your bladder. Your health care provider will wait for some urine to start flowing through the catheter. When this happens, a syringe will be used to fill the balloon with sterile water. A collection bag will be attached to the end of the catheter. The procedure may vary among health care providers and hospitals. What can I expect after procedure? After the procedure, it is common to have: Some discomfort around the opening  in your abdomen. Follow these instructions at home: Caring for the skin around the catheter Use a clean washcloth and soapy water to clean the skin around your catheter every day. Pat the area dry with a clean towel. Do not pull on the catheter. Do not use ointment or lotion on this area unless told by your health care provider. Check the skin around the catheter every day for signs of infection. Check for: Redness, swelling, or pain. Fluid or blood. Warmth. Pus or a bad smell. Caring for the catheter Clean the catheter with soap and water as often as told by your health care provider. Always make sure there are no twists or curls (kinks) in the catheter. As soon as you are able to move, you may use a leg bag to collect the urine. Make sure that the tubing is straight and without kinks. Wrap an ace bandage gently over the tubing and around your leg to minimize the risk of the bag getting pulled out. Emptying the collection bag Empty the large collection bag every 8 hours. Empty the small collection bag when it is about ? full. To empty your large or small collection bag, take the following steps: Always keep the bag below the level of the catheter. This keeps urine from flowing backward into the catheter. Hold the bag over the toilet or another container. Turn the valve (spigot) at the bottom of the bag to empty the urine. Do not touch the opening of the spigot. Do not let the opening touch the toilet or container. Close the spigot  tightly when the bag is empty. Cleaning the collection bag  Wash your hands with soap and water. If soap and water are not available, use hand sanitizer. Disconnect the bag from the catheter and immediately attach a new bag to the catheter. Empty the used bag completely. Clean the used bag according to the manufacturer's instructions, or as told by your health care provider. Let the bag dry completely, and put it in a clean plastic bag before storing  it.  General instructions  Always wash your hands before and after caring for your catheter and collection bag. Use soap and water. If soap and water are not available, use hand sanitizer. Always make sure there are no leaks in the catheter or collection bag. Drink enough fluid to keep your urine pale yellow. If you were prescribed an antibiotic medicine, take it as told by your health care provider. Do not stop taking the antibiotic even if you start to feel better. Do not take baths, swim, or use a hot tub. Keep all follow-up visits as told by your health care provider. This is important.  Contact a health care provider if: You leak urine. You have redness, swelling, or pain around your catheter opening. You have fluid or blood coming from your catheter opening. Your catheter opening feels warm to the touch. You have pus or a bad smell coming from your catheter opening. You have a fever or chills. Your urine flow slows down. Your urine becomes cloudy or smelly. Get help right away if: Your catheter comes out. You feel nauseous. You have back pain. You have difficulty changing your catheter. You have blood in your urine. You have no urine flow for 1 hour. Summary Suprapubic catheter replacement is a procedure to remove an old catheter and insert a new, clean catheter. Make sure that you understand how to care for your catheter, your collection bag, and the opening in your abdomen. Always wash your hands before and after caring for your catheter and collection bag. Contact a health care provider if you leak urine or have a fever or any signs of infection around the catheter opening, or if your urine becomes cloudy or smelly. Get help right away if your catheter comes out or you have nausea, back pain, blood in your urine, no urine flow for 1 hour, or difficulty changing your catheter. This information is not intended to replace advice given to you by your health care provider. Make  sure you discuss any questions you have with your healthcare provider. Document Revised: 07/21/2018 Document Reviewed: 07/21/2018 Elsevier Patient Education  [place].

## 2021-06-25 ENCOUNTER — Ambulatory Visit: Payer: Non-veteran care

## 2021-06-27 ENCOUNTER — Ambulatory Visit (INDEPENDENT_AMBULATORY_CARE_PROVIDER_SITE_OTHER): Payer: No Typology Code available for payment source | Admitting: Urology

## 2021-06-27 ENCOUNTER — Encounter: Payer: Self-pay | Admitting: Urology

## 2021-06-27 ENCOUNTER — Other Ambulatory Visit: Payer: Self-pay

## 2021-06-27 VITALS — BP 117/71 | HR 79

## 2021-06-27 DIAGNOSIS — N3020 Other chronic cystitis without hematuria: Secondary | ICD-10-CM | POA: Diagnosis not present

## 2021-06-27 DIAGNOSIS — Z435 Encounter for attention to cystostomy: Secondary | ICD-10-CM | POA: Diagnosis not present

## 2021-06-27 DIAGNOSIS — R339 Retention of urine, unspecified: Secondary | ICD-10-CM | POA: Diagnosis not present

## 2021-06-27 NOTE — Progress Notes (Signed)
Subjective: 1. Urinary retention   2. Chronic cystitis   3. 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 Cory Pacheco returns today for SP exchange and upsizing.  He was in 2 weeks ago and saw Dr. Alyson Ingles for tube obstruction and was found to have a very encrusted and obstructed tube but a 94fr was replaced.   The tube has been draining but he had had some hematuria.  He remains on anticoagulation.    05/16/21: Cory Pacheco is a 75 yo male who had a SP tube placed at the [facility] earlier this year and it was upsized to a 1fr in April but he hasn't had it changed since.  He has a history of retention with recurrent UTI's and sepsis.  He had a CT earlier this year and didn't have stones.   He has a history of Parkinson's disease and Seizure.  He has no other GU history.   ROS:  ROS  Allergies  Allergen Reactions   Penicillins Hives   Bupropion Other (See Comments)    Patient not sure   Metformin Other (See Comments)    Patient not sure   Sulfa Antibiotics Other (See Comments)    Patient not sure    Past Medical History:  Diagnosis Date   Anxiety    Arthritis    Depression    Hypertension    Seizure ([place])     Past Surgical History:  Procedure Laterality Date   CHOLECYSTECTOMY     COLONOSCOPY     COLONOSCOPY N/A 08/03/2013   Procedure: COLONOSCOPY;  Surgeon: Rogene Houston, MD;  [facility];  Service: Endoscopy;  Laterality: N/A;  70   TONSILLECTOMY      Social History   Socioeconomic History   Marital status: Married    Spouse name: Not on file   Number of children: Not on file   Years of education: Not on file   Highest education level: Not on file  Occupational History   Not on file  Tobacco Use   Smoking status: Never   Smokeless tobacco: Never  Substance and Sexual Activity   Alcohol use: Not on file   Drug use: Not on file   Sexual activity: Yes  Other Topics Concern   Not on file  Social History Narrative   Not on file   Social  Determinants of Health   Financial Resource Strain: Not on file  Food Insecurity: Not on file  Transportation Needs: Not on file  Physical Activity: Not on file  Stress: Not on file  Social Connections: Not on file  Intimate Partner Violence: Not on file    Family History  Problem Relation Age of Onset   Sudden death Neg Hx    Sickle cell trait Neg Hx    Lupus Neg Hx    Anesthesia problems Neg Hx     Anti-infectives: Anti-infectives (From admission, onward)    None       Current Outpatient Medications  Medication Sig Dispense Refill   acetaminophen (TYLENOL) 325 MG tablet Take 2 tablets (650 mg total) by mouth every 6 (six) hours as needed for mild pain (temp > 101.5).     albuterol (PROVENTIL HFA;VENTOLIN HFA) 108 (90 Base) MCG/ACT inhaler Inhale 2 puffs into the lungs every 6 (six) hours as needed for wheezing or shortness of breath.     ALPRAZolam (XANAX) 0.25 MG tablet Take 1 tablet (0.25 mg total) by mouth 2 (two) times daily.  10 tablet 0   amLODipine (NORVASC) 5 MG tablet Take 2.5 mg by mouth daily.      apixaban (ELIQUIS) 5 MG TABS tablet by Does not apply route.     aspirin EC 81 MG tablet Take 81 mg by mouth daily.     Calcium Citrate (CITRACAL PO) Take 1 tablet by mouth daily.     Cholecalciferol (VITAMIN D3) 2000 UNITS TABS Take 1 tablet by mouth daily.     divalproex (DEPAKOTE) 500 MG DR tablet Take 1 tablet (500 mg total) by mouth 3 (three) times daily.     folic acid (FOLVITE) 1 MG tablet Take 2 mg by mouth daily.     levETIRAcetam (KEPPRA) 500 MG tablet Take 1 tablet (500 mg total) by mouth 2 (two) times daily.     Multiple Vitamin (MULTIVITAMIN) LIQD Take 15 mLs by mouth daily.     omeprazole (PRILOSEC) 20 MG capsule Take 20 mg by mouth 2 (two) times daily.     potassium chloride SA (K-DUR,KLOR-CON) 20 MEQ tablet Take 20 mEq by mouth daily.     pravastatin (PRAVACHOL) 40 MG tablet Take 40 mg by mouth at bedtime.      sertraline (ZOLOFT) 50 MG tablet Take 75  mg by mouth daily.     No current facility-administered medications for this visit.     Objective: Vital signs in last 24 hours: BP 117/71   Pulse 79   Intake/Output from previous day: No intake/output data recorded. Intake/Output this shift: @IOTHISSHIFT @   Physical Exam Vitals reviewed.  Constitutional:      Comments: Wheelchair bound and non-communicative.   Abdominal:     Comments: Soft, obese with a clean SP tube site in the lower abdomen.     Lab Results:  No results found for this or any previous visit (from the past 24 hour(s)).  BMET No results for input(s): NA, K, CL, CO2, GLUCOSE, BUN, CREATININE, CALCIUM in the last 72 hours. PT/INR No results for input(s): LABPROT, INR in the last 72 hours. ABG No results for input(s): PHART, HCO3 in the last 72 hours.  Invalid input(s): PCO2, PO2  Studies/Results: Procedure: SP Tube change.    The 91fr tube was removed and the site was prepped with betadine and a fresh 22fr foley was placed without difficulty.  The balloon was filled with 66ml  of fluid.  The catheter was flushed with return of a few small clots.   A bedside bag was placed.   Assessment/Plan: Urinary retention with recurrent sepsis.   SP tube replaced and upsized to 53fr.   He will have an order sent for weekly irrigations by home health and I will have him return in 3 week for cystoscopy and tube change.  No orders of the defined types were placed in this encounter.    No orders of the defined types were placed in this encounter.    No follow-ups on file.    CC: Dr. Gar Ponto and [facility] (Dr. Dene Gentry in Urology).      Cory Pacheco 06/27/2021 [phone]

## 2021-06-27 NOTE — Progress Notes (Signed)
Home health order written for home health nurse to flush sp tube once weekly at their visit with 30-50 cc of sterile water. Order given to wife.   Urological Symptom Review   Patient is experiencing the following symptoms: Patient has SP tube   Review of Systems  Gastrointestinal (upper)  : Negative for upper GI symptoms  Gastrointestinal (lower) : Negative for lower GI symptoms  Constitutional : Negative for symptoms  Skin: Negative for skin symptoms  Eyes: Negative for eye symptoms  Ear/Nose/Throat : Negative for Ear/Nose/Throat symptoms  Hematologic/Lymphatic: Negative for Hematologic/Lymphatic symptoms  Cardiovascular : Negative for cardiovascular symptoms  Respiratory : Negative for respiratory symptoms  Endocrine: Negative for endocrine symptoms  Musculoskeletal: Negative for musculoskeletal symptoms  Neurological: Negative for neurological symptoms  Psychologic: Negative for psychiatric symptoms

## 2021-07-01 ENCOUNTER — Ambulatory Visit: Payer: PPO | Admitting: Urology

## 2021-07-25 ENCOUNTER — Ambulatory Visit (INDEPENDENT_AMBULATORY_CARE_PROVIDER_SITE_OTHER): Payer: No Typology Code available for payment source | Admitting: Urology

## 2021-07-25 ENCOUNTER — Other Ambulatory Visit: Payer: Self-pay

## 2021-07-25 VITALS — BP 89/59 | HR 99

## 2021-07-25 DIAGNOSIS — Z435 Encounter for attention to cystostomy: Secondary | ICD-10-CM | POA: Diagnosis not present

## 2021-07-25 DIAGNOSIS — N3020 Other chronic cystitis without hematuria: Secondary | ICD-10-CM

## 2021-07-25 DIAGNOSIS — R310 Gross hematuria: Secondary | ICD-10-CM

## 2021-07-25 DIAGNOSIS — R339 Retention of urine, unspecified: Secondary | ICD-10-CM

## 2021-07-25 NOTE — Progress Notes (Signed)
Subjective: 1. Attention to cystostomy ([place])   2. Urinary retention   3. Chronic cystitis   4. Gross hematuria     07/25/21: Cory Pacheco returns today in f/u for cystoscopy and SP tube exchage.   He remains on Eliquis and continued to have some bloody urine and bleeding from granulation tissue at the insertion site.     06/27/21: Cory Pacheco returns today for SP exchange and upsizing.  He was in 2 weeks ago and saw Dr. Alyson Ingles for tube obstruction and was found to have a very encrusted and obstructed tube but a 60f was replaced.   The tube has been draining but he had had some hematuria.  He remains on anticoagulation.    05/16/21: Cory Pacheco a 75yo male [facility] and it was upsized to a 137fin April but he hasn't had it changed since.  He has a history of retention with recurrent UTI's and sepsis.  He had a CT earlier this year and didn't have stones.   He has a history of Parkinson's disease and Seizure.  He has no other GU history.   ROS:  ROS  Allergies  Allergen Reactions   Penicillins Hives   Bupropion Other (See Comments)    Patient not sure   Metformin Other (See Comments)    Patient not sure   Sulfa Antibiotics Other (See Comments)    Patient not sure    Past Medical History:  Diagnosis Date   Anxiety    Arthritis    Depression    Hypertension    Seizure ([facility]    Past Surgical History:  Procedure Laterality Date   CHOLECYSTECTOMY     COLONOSCOPY     COLONOSCOPY N/A 08/03/2013   Procedure: COLONOSCOPY;  Surgeon: NaRogene HoustonMD;  [facility];  Service: Endoscopy;  Laterality: N/A;  9365 TONSILLECTOMY      Social History   Socioeconomic History   Marital status: Married    Spouse name: Not on file   Number of children: Not on file   Years of education: Not on file   Highest education level: Not on file  Occupational History   Not on file  Tobacco Use   Smoking status: Never    Smokeless tobacco: Never  Substance and Sexual Activity   Alcohol use: Not on file   Drug use: Not on file   Sexual activity: Yes  Other Topics Concern   Not on file  Social History Narrative   Not on file   Social Determinants of Health   Financial Resource Strain: Not on file  Food Insecurity: Not on file  Transportation Needs: Not on file  Physical Activity: Not on file  Stress: Not on file  Social Connections: Not on file  Intimate Partner Violence: Not on file    Family History  Problem Relation Age of Onset   Sudden death Neg Hx    Sickle cell trait Neg Hx    Lupus Neg Hx    Anesthesia problems Neg Hx     Anti-infectives: Anti-infectives (From admission, onward)    None       Current Outpatient Medications  Medication Sig Dispense Refill   acetaminophen (TYLENOL) 325 MG tablet Take 2 tablets (650 mg total) by mouth every 6 (six) hours as needed for mild pain (temp > 101.5).     albuterol (PROVENTIL HFA;VENTOLIN HFA) 108 (90  Base) MCG/ACT inhaler Inhale 2 puffs into the lungs every 6 (six) hours as needed for wheezing or shortness of breath.     amLODipine (NORVASC) 5 MG tablet Take 2.5 mg by mouth daily.      apixaban (ELIQUIS) 5 MG TABS tablet by Does not apply route.     Cholecalciferol (VITAMIN D3) 2000 UNITS TABS Take 1 tablet by mouth daily.     divalproex (DEPAKOTE) 500 MG DR tablet Take 1 tablet (500 mg total) by mouth 3 (three) times daily.     folic acid (FOLVITE) 1 MG tablet Take 2 mg by mouth daily.     omeprazole (PRILOSEC) 20 MG capsule Take 20 mg by mouth 2 (two) times daily.     No current facility-administered medications for this visit.     Objective: Vital signs in last 24 hours: BP (!) 89/59   Pulse 99   Intake/Output from previous day: No intake/output data recorded. Intake/Output this shift: '@IOTHISSHIFT'$ @   Physical Exam Vitals reviewed.  Constitutional:      Comments: Wheelchair bound and non-communicative.   Abdominal:      Comments: Soft, obese with a clean SP tube site in the lower abdomen but there is some granulation tissue with recent bleeding at the insertion site. .     Lab Results:  No results found for this or any previous visit (from the past 24 hour(s)).  BMET No results for input(s): NA, K, CL, CO2, GLUCOSE, BUN, CREATININE, CALCIUM in the last 72 hours. PT/INR No results for input(s): LABPROT, INR in the last 72 hours. ABG No results for input(s): PHART, HCO3 in the last 72 hours.  Invalid input(s): PCO2, PO2  UA is grossly bloody.   Studies/Results: Procedure: SP Tube exchange and cystoscopy.    The 22f tube was removed and the site was prepped with betadine and the flexible scope was passed.  The track was long and some what angulated but I was able to enter the bladder.  About 315mof bloody turbid urine was aspirated with a specimen sent for culture.   Cystoscopy then demonstrated diffuse erythema most consistent with inflammation from chronic infection and the tube irritation.   No tumors were seen.  The bladder neck and UO's weren't visualized.  The scope was removed and a fresh 1856foley was placed with slight difficulty.   The balloon was filled with 35m58m fluid and the catheter was irrigated with appropriate return confirming placement.    A silver nitrate stick was used to cauterized the granulation tissue at the insertion site that had evidence of recent bleeding.  A bed side bag was placed.    Assessment/Plan: Urinary retention with recurrent sepsis.   SP tube replaced.   Urine culture obtained.   He will continue monthly tube changes and irrigations with home health.     No orders of the defined types were placed in this encounter.    Orders Placed This Encounter  Procedures   Urine Culture      Return in about 1 month (around 08/25/2021) for Continue monthly tube changes.  OV with MD in 6 months. .    CC: [facility]).      JohnIrine Seal1/2022 [phone]

## 2021-07-25 NOTE — Progress Notes (Signed)
Urological Symptom Review   Cath Change/ Replacement  Patient is present today for a catheter change due to urinary retention.  60m of water was removed from the balloon, a 18FR foley cath was removed with out difficulty.  Patient was cleaned and prepped in a sterile fashion with betadine. A 18 FR foley cath was replaced into the bladder no complications were noted Urine return was noted 111mand urine was yellow in color. The balloon was filled with 1083mf sterile water. A bedside bag was attached for drainage.  A night bag was also given to the patient and patient was given instruction on how to change from one bag to another. Patient was given proper instruction on catheter care.    Performed by: Dr. J WKeene Breathollow up: 3 weeks with MD for cysto/tube change   Patient is experiencing the following symptoms: Blood in urine   Review of Systems  Gastrointestinal (upper)  : Negative for upper GI symptoms  Gastrointestinal (lower) : Negative for lower GI symptoms  Constitutional : Negative for symptoms  Skin: Negative for skin symptoms  Eyes: Negative for eye symptoms  Ear/Nose/Throat : Negative for Ear/Nose/Throat symptoms  Hematologic/Lymphatic: Negative for Hematologic/Lymphatic symptoms  Cardiovascular : Negative for cardiovascular symptoms  Respiratory : Negative for respiratory symptoms  Endocrine: Negative for endocrine symptoms  Musculoskeletal: Negative for musculoskeletal symptoms  Neurological: Negative for neurological symptoms  Psychologic: Negative for psychiatric symptoms

## 2021-07-26 ENCOUNTER — Telehealth: Payer: Self-pay | Admitting: Family Medicine

## 2021-07-26 NOTE — Telephone Encounter (Signed)
Cory Pacheco DOB: [birth year] MRN: [id number]   RIDER WAIVER AND RELEASE OF LIABILITY  For purposes of improving physical access to our facilities, [facility] is pleased to partner with third parties to provide [facility] patients or other authorized individuals the option of convenient, on-demand ground transportation services (the [employer]") through use of the technology service that enables users to request on-demand ground transportation from independent third-party providers.  By opting to use and accept these [employer], I, the undersigned, hereby agree on behalf of myself, and on behalf of any minor child using the Science writer for whom I am the parent or legal guardian, as follows:  Science writer provided to me are provided by independent third-party transportation providers who are not [employer] or employees and who are unaffiliated with [employer]. [place] is neither a transportation carrier nor a common or public carrier. [facility] has no control over the quality or safety of the transportation that occurs as a result of the [employer]. [facility] cannot guarantee that any third-party transportation provider will complete any arranged transportation service. [facility] makes no representation, warranty, or guarantee regarding the reliability, timeliness, quality, safety, suitability, or availability of any of the Transport Services or that they will be error free. I fully understand that traveling by vehicle involves risks and dangers of serious bodily injury, including permanent disability, paralysis, and death. I agree, on behalf of myself and on behalf of any minor child using the Transport Services for whom I am the parent or legal guardian, that the entire risk arising out of my use of the [employer] remains solely with me, to the maximum extent permitted under applicable law. The [employer] are provided  "as is" and "as available." [facility] disclaims all representations and warranties, express, implied or statutory, not expressly set out in these terms, including the implied warranties of merchantability and fitness for a particular purpose. I hereby waive and release [facility], its agents, employees, officers, directors, representatives, insurers, attorneys, assigns, successors, subsidiaries, and affiliates from any and all past, present, or future claims, demands, liabilities, actions, causes of action, or suits of any kind directly or indirectly arising from acceptance and use of the [employer]. I further waive and release [facility] and its affiliates from all present and future liability and responsibility for any injury or death to persons or damages to property caused by or related to the use of the [employer]. I have read this Waiver and Release of Liability, and I understand the terms used in it and their legal significance. This Waiver is freely and voluntarily given with the understanding that my right (as well as the right of any minor child for whom I am the parent or legal guardian using the [employer]) to legal recourse against [facility] in connection with the [employer] is knowingly surrendered in return for use of these services.   I attest that I read the consent document to Cory Pacheco, gave [name] the opportunity to ask questions and answered the questions asked (if any). I affirm that Cory Pacheco then provided consent for he's participation in this program.     Cory Pacheco, Cory Pacheco consented on her husband behalf.

## 2021-07-30 LAB — URINE CULTURE

## 2021-07-31 ENCOUNTER — Telehealth: Payer: Self-pay

## 2021-07-31 MED ORDER — DOXYCYCLINE HYCLATE 100 MG PO CAPS: 100.0000 mg | ORAL_CAPSULE | Freq: Two times a day (BID) | ORAL | 0 refills | Status: DC

## 2021-07-31 NOTE — Telephone Encounter (Signed)
-----   Message from Irine Seal, MD sent at 07/31/2021  7:33 AM EDT ----- He is growing two bacteria which is not surprising with the SP tube.   I generally don't treat these but with the recent hematuria, I would like to give him doxycycline '100mg'$  po bid #[phone]. ----- Message ----- From: Iris Pert, LPN Sent: [phone]   4:13 PM EDT To: Irine Seal, MD  Please review

## 2021-07-31 NOTE — Telephone Encounter (Signed)
Rx sent. Patient wife called and made aware.

## 2021-08-06 DIAGNOSIS — J181 Lobar pneumonia, unspecified organism: Secondary | ICD-10-CM | POA: Diagnosis not present

## 2021-08-06 DIAGNOSIS — G40909 Epilepsy, unspecified, not intractable, without status epilepticus: Secondary | ICD-10-CM | POA: Diagnosis not present

## 2021-08-06 DIAGNOSIS — L89153 Pressure ulcer of sacral region, stage 3: Secondary | ICD-10-CM | POA: Diagnosis not present

## 2021-08-06 DIAGNOSIS — I2510 Atherosclerotic heart disease of native coronary artery without angina pectoris: Secondary | ICD-10-CM | POA: Diagnosis not present

## 2021-08-06 DIAGNOSIS — F0280 Dementia in other diseases classified elsewhere without behavioral disturbance: Secondary | ICD-10-CM | POA: Diagnosis not present

## 2021-08-06 DIAGNOSIS — I4891 Unspecified atrial fibrillation: Secondary | ICD-10-CM | POA: Diagnosis not present

## 2021-08-06 DIAGNOSIS — N390 Urinary tract infection, site not specified: Secondary | ICD-10-CM | POA: Diagnosis not present

## 2021-08-06 DIAGNOSIS — G20 Parkinson's disease: Secondary | ICD-10-CM | POA: Diagnosis not present

## 2021-08-07 ENCOUNTER — Other Ambulatory Visit (HOSPITAL_COMMUNITY): Payer: Self-pay | Admitting: Specialist

## 2021-08-07 DIAGNOSIS — R1312 Dysphagia, oropharyngeal phase: Secondary | ICD-10-CM

## 2021-08-15 ENCOUNTER — Other Ambulatory Visit: Payer: Self-pay

## 2021-08-15 ENCOUNTER — Ambulatory Visit (INDEPENDENT_AMBULATORY_CARE_PROVIDER_SITE_OTHER): Payer: No Typology Code available for payment source | Admitting: Urology

## 2021-08-15 VITALS — BP 112/74 | HR 81

## 2021-08-15 DIAGNOSIS — R310 Gross hematuria: Secondary | ICD-10-CM

## 2021-08-15 DIAGNOSIS — Z435 Encounter for attention to cystostomy: Secondary | ICD-10-CM | POA: Diagnosis not present

## 2021-08-15 DIAGNOSIS — R339 Retention of urine, unspecified: Secondary | ICD-10-CM

## 2021-08-15 NOTE — Progress Notes (Signed)
Subjective: 1. Attention to cystostomy ([facility])   2. Urinary retention     08/15/21: Mr. Cory Pacheco returns for an SP tube exchange.  He has minimal hematuria and no further bleeding at the insertion site but has some bleeding from granulation around the PEG tube site and he has a lesion on his left scalp.    07/25/21: Mr. Cory Pacheco returns today in f/u for cystoscopy and SP tube exchage.   He remains on Eliquis and continued to have some bloody urine and bleeding from granulation tissue at the insertion site.     06/27/21: Mr. Cory Pacheco returns today for SP exchange and upsizing.  He was in 2 weeks ago and saw Dr. Ronne Binning for tube obstruction and was found to have a very encrusted and obstructed tube but a 36fr was replaced.   The tube has been draining but he had had some hematuria.  He remains on anticoagulation.    05/16/21: Mr. Cory Pacheco is a 75 yo male who had a SP tube placed at the [facility] earlier this year and it was upsized to a 76fr in April but he hasn't had it changed since.  He has a history of retention with recurrent UTI's and sepsis.  He had a CT earlier this year and didn't have stones.   He has a history of Parkinson's disease and Seizure.  He has no other GU history.   ROS:  ROS  Allergies  Allergen Reactions   Penicillins Hives   Bupropion Other (See Comments)    Patient not sure   Metformin Other (See Comments)    Patient not sure   Sulfa Antibiotics Other (See Comments)    Patient not sure    Past Medical History:  Diagnosis Date   Anxiety    Arthritis    Depression    Hypertension    Seizure ([facility])     Past Surgical History:  Procedure Laterality Date   CHOLECYSTECTOMY     COLONOSCOPY     COLONOSCOPY N/A 08/03/2013   Procedure: COLONOSCOPY;  Surgeon: Malissa Hippo, MD;  [facility];  Service: Endoscopy;  Laterality: N/A;  930   TONSILLECTOMY      Social History   Socioeconomic History   Marital status: Married    Spouse name: Not on file    Number of children: Not on file   Years of education: Not on file   Highest education level: Not on file  Occupational History   Not on file  Tobacco Use   Smoking status: Never   Smokeless tobacco: Never  Substance and Sexual Activity   Alcohol use: Not on file   Drug use: Not on file   Sexual activity: Yes  Other Topics Concern   Not on file  Social History Narrative   Not on file   Social Determinants of Health   Financial Resource Strain: Not on file  Food Insecurity: Not on file  Transportation Needs: Not on file  Physical Activity: Not on file  Stress: Not on file  Social Connections: Not on file  Intimate Partner Violence: Not on file    Family History  Problem Relation Age of Onset   Sudden death Neg Hx    Sickle cell trait Neg Hx    Lupus Neg Hx    Anesthesia problems Neg Hx     Anti-infectives: Anti-infectives (From admission, onward)    None       Current Outpatient Medications  Medication Sig Dispense Refill  acetaminophen (TYLENOL) 325 MG tablet Take 2 tablets (650 mg total) by mouth every 6 (six) hours as needed for mild pain (temp > 101.5).     albuterol (PROVENTIL HFA;VENTOLIN HFA) 108 (90 Base) MCG/ACT inhaler Inhale 2 puffs into the lungs every 6 (six) hours as needed for wheezing or shortness of breath.     amLODipine (NORVASC) 5 MG tablet Take 2.5 mg by mouth daily.      apixaban (ELIQUIS) 5 MG TABS tablet by Does not apply route.     Cholecalciferol (VITAMIN D3) 2000 UNITS TABS Take 1 tablet by mouth daily.     divalproex (DEPAKOTE) 500 MG DR tablet Take 1 tablet (500 mg total) by mouth 3 (three) times daily.     doxycycline (VIBRAMYCIN) 100 MG capsule Take 1 capsule (100 mg total) by mouth 2 (two) times daily. 14 capsule 0   folic acid (FOLVITE) 1 MG tablet Take 2 mg by mouth daily.     omeprazole (PRILOSEC) 20 MG capsule Take 20 mg by mouth 2 (two) times daily.     No current facility-administered medications for this visit.      Objective: Vital signs in last 24 hours: BP 112/74   Pulse 81   Intake/Output from previous day: No intake/output data recorded. Intake/Output this shift: @IOTHISSHIFT @   Physical Exam Vitals reviewed.  Constitutional:      Comments: Wheelchair bound and non-communicative.   Abdominal:     Comments: Soft, obese with a clean SP tube site in the lower abdomen with some crusting of the fulguerated granulation tissue  at the insertion site.   There is granulation tissue around the PEG tube site with recent bleeding and I cauterized it with a silver nitrate stick.    Skin:    Comments: He has a slightly raised mottled dark lesion on the left scalp of uncertain nature.  I have recommended he be seen by dermatology.     Lab Results:  No results found for this or any previous visit (from the past 24 hour(s)).  BMET No results for input(s): NA, K, CL, CO2, GLUCOSE, BUN, CREATININE, CALCIUM in the last 72 hours. PT/INR No results for input(s): LABPROT, INR in the last 72 hours. ABG No results for input(s): PHART, HCO3 in the last 72 hours.  Invalid input(s): PCO2, PO2  UA is grossly bloody.   Studies/Results: Procedure: SP Tube exchange and cauterization of the PEG tube site.   Assessment/Plan: Urinary retention with recurrent sepsis.   SP tube replaced.    He will continue monthly tube changes and irrigations with home health.     No orders of the defined types were placed in this encounter.    No orders of the defined types were placed in this encounter.     Return for Continue month tube changes.  He will need cystoscopy next August. .    CC: Dr. Donzetta Pacheco and [facility] (Dr. Elveria Pacheco in Urology).      Cory Pacheco 08/15/2021 [phone] ID: Cory Pacheco, male   DOB: August 07, 1946, 75 y.o.   MRN: [id number]

## 2021-08-15 NOTE — Progress Notes (Signed)
Urological Symptom Review  Patient is experiencing the following symptoms: none   Review of Systems  Gastrointestinal (upper)  : Negative for upper GI symptoms  Gastrointestinal (lower) : Negative for lower GI symptoms  Constitutional : Negative for symptoms  Skin: Negative for skin symptoms  Eyes: Negative for eye symptoms  Ear/Nose/Throat : Negative for Ear/Nose/Throat symptoms  Hematologic/Lymphatic: Negative for Hematologic/Lymphatic symptoms  Cardiovascular : Negative for cardiovascular symptoms  Respiratory : Negative for respiratory symptoms  Endocrine: Negative for endocrine symptoms  Musculoskeletal: Negative for musculoskeletal symptoms  Neurological: Negative for neurological symptoms  Psychologic: Negative for psychiatric symptoms  Suprapubic Cath Change  Patient is present today for a suprapubic catheter change due to urinary retention.  5m of water was drained from the balloon, a 18FR foley cath was removed from the tract with out difficulty.  Site was cleaned and prepped in a sterile fashion with betadine.  A 18FR foley cath was replaced into the tract no complications were noted. Urine return was noted, 10 ml of sterile water was inflated into the balloon and a bedside bag was attached for drainage.  Patient tolerated well. A night bag was given to patient and proper instruction was given on how to switch bags.    Performed by: Jarrah Seher LPN  Follow up: 1 month cath change

## 2021-08-18 LAB — URINE CULTURE

## 2021-08-20 ENCOUNTER — Telehealth: Payer: Self-pay

## 2021-08-20 NOTE — Telephone Encounter (Signed)
Patient wife informed of urine culture results and Dr. Alan Ripper response. Wife states patient bled about 2 days after cath change and urine is currently yellow and clear. Wife did want to inform you the patient has asked for tylenol 2 days in a row but has not ran a fever.

## 2021-08-21 ENCOUNTER — Ambulatory Visit (HOSPITAL_COMMUNITY)
Admission: RE | Admit: 2021-08-21 | Discharge: 2021-08-21 | Disposition: A | Discharge status: Home / Self Care | Payer: No Typology Code available for payment source | Source: Ambulatory Visit | Attending: Family Medicine | Admitting: Family Medicine

## 2021-08-21 ENCOUNTER — Other Ambulatory Visit: Payer: Self-pay

## 2021-08-21 ENCOUNTER — Ambulatory Visit (HOSPITAL_COMMUNITY): Payer: No Typology Code available for payment source | Attending: Family Medicine | Admitting: Speech Pathology

## 2021-08-21 DIAGNOSIS — R1312 Dysphagia, oropharyngeal phase: Secondary | ICD-10-CM | POA: Insufficient documentation

## 2021-08-27 NOTE — Therapy (Signed)
Material does not enter airway  Pharyngeal- Mechanical Soft NT  Pharyngeal --  Pharyngeal- Regular Reduced pharyngeal peristalsis;Reduced epiglottic inversion;Pharyngeal residue - valleculae  Pharyngeal Material does not enter airway  Pharyngeal- Multi-consistency NT  Pharyngeal --  Pharyngeal- Pill --  Pharyngeal --  Pharyngeal Comment --     Leonidas Boateng H. Roddie Mc, CCC-SLP Speech Language Pathologist   Wende Bushy 08/27/2021, 8:58 AM                            Patient will benefit from skilled therapeutic intervention in order to improve the following deficits and impairments:   Dysphagia, oropharyngeal phase        Problem List Patient Active Problem List   Diagnosis Date Noted   Pressure injury of skin 05/06/2017   Encounter for orogastric (OG) tube placement    Seizure ([place]) 05/05/2017   Status epilepticus ([place])    Acute respiratory failure [facility])     Wende Bushy 08/27/2021, 8:57 AM  [place] [address] [place], [place], [address] Phone: [phone]   Fax:  [phone]  Name: BERNELL SEKHON MRN: [id number] Date of Birth: June 15, 1946  Material does not enter airway  Pharyngeal- Mechanical Soft NT  Pharyngeal --  Pharyngeal- Regular Reduced pharyngeal peristalsis;Reduced epiglottic inversion;Pharyngeal residue - valleculae  Pharyngeal Material does not enter airway  Pharyngeal- Multi-consistency NT  Pharyngeal --  Pharyngeal- Pill --  Pharyngeal --  Pharyngeal Comment --     Leonidas Boateng H. Roddie Mc, CCC-SLP Speech Language Pathologist   Wende Bushy 08/27/2021, 8:58 AM                            Patient will benefit from skilled therapeutic intervention in order to improve the following deficits and impairments:   Dysphagia, oropharyngeal phase        Problem List Patient Active Problem List   Diagnosis Date Noted   Pressure injury of skin 05/06/2017   Encounter for orogastric (OG) tube placement    Seizure ([place]) 05/05/2017   Status epilepticus ([place])    Acute respiratory failure [facility])     Wende Bushy 08/27/2021, 8:57 AM  [place] [address] [place], [place], [address] Phone: [phone]   Fax:  [phone]  Name: BERNELL SEKHON MRN: [id number] Date of Birth: June 15, 1946  [place], [place], [address] Phone: [phone]   Fax:  [phone]  Modified Barium Swallow  Patient Details  Name: JOLAN WARDROP MRN: [id number] Date of Birth: 09/11/1946 No data recorded  Encounter Date: 08/21/2021   End of Session - 08/27/21 0856     Visit Number 1    Number of Visits 1    Activity Tolerance Patient tolerated treatment well            Past Medical History:  Past Medical History:  Diagnosis Date   Anxiety    Arthritis    Depression    Hypertension    Seizure ([place])    Past Surgical History:  Past Surgical History:  Procedure Laterality Date   CHOLECYSTECTOMY     COLONOSCOPY     COLONOSCOPY N/A 08/03/2013   Procedure: COLONOSCOPY;  Surgeon: Rogene Houston, MD;  [facility];  Service: Endoscopy;  Laterality: N/A;  52   TONSILLECTOMY     HPI: Mr. Christine is a 75 yo male who had a SP tube placed at the [facility] earlier this year and it was upsized to a 74f in April but he hasn't had it changed since.  He has a history of retention with recurrent UTI's and sepsis.  He had a CT earlier this year and didn't have stones.  He has a history of Parkinson's disease and Seizure. Pt was referred [place] 6 [place] ago and [facility]; Pt has been strictly NPO since SP tube placement in February of 2022. MBS was completed on 05/29/21 recommending initiation of D1/puree diet and HTL. Repeat MBS requested for potential liquid upgrade   No data recorded   Assessment / Plan / Recommendation  CHL IP CLINICAL IMPRESSIONS 08/21/2021  Clinical Impression Pt presents with moderate sensorimotor oropharyngeal dysphagia characterized by penetration and silent aspiration of all liquid consistencies presented today; note very effective cued cough if consistently produced that prevents initially penetrated liquids from being aspirated. In contrast from his initial MBS on  05/29/2021 (~3 months ago), Pt's swallowing function is not significantly improved (trace amounts of HTL were aspirated today and not on the initial study, however only trace amounts of thin and NTL were aspirated after the swallow and strategies were more effective vs larger volume of aspiration on first study), however, responsiveness to cues (to cough, throat clear or repeat dry swallow) was improved resulting in effective usage of strategies and clearance of penetrates as a result of HH ST. Note decreased pharyngeal squeeze, decreased laryngeal vestibule closure, and decreased epiglottic deflection. Further note improved oral stage with more rapid, cohesive and coordinated oral prep stage with liquids and solids. Unfortunately, since deep penetration/aspiration is silent Pt does require a cue; rarely is a reflexive cough produced. With puree textures Pt demonstrates mild to moderate valleculae residue that is cleared with consecutive bolus presentations of solid. Liquid upgrade is not recommended at this time secondary to penetration/aspiration after the swallow of these thinner consistencies and increased risk of aspirating solid textures that have mixed with thinner liquids. Recommend continue with diet of soft textures with 2-3 swallows for each bite and HONEY thick liquids with cued cough after each swallow of liquids.  Recommend free water protocol - water only in between meals and after oral care. Above results reviewed with treating [facility]. Recommend HH ST to continue to follow  SLP Visit Diagnosis Dysphagia, unspecified (R13.10)

## 2021-09-02 DIAGNOSIS — I2510 Atherosclerotic heart disease of native coronary artery without angina pectoris: Secondary | ICD-10-CM | POA: Diagnosis not present

## 2021-09-02 DIAGNOSIS — I4891 Unspecified atrial fibrillation: Secondary | ICD-10-CM | POA: Diagnosis not present

## 2021-09-02 DIAGNOSIS — L89153 Pressure ulcer of sacral region, stage 3: Secondary | ICD-10-CM | POA: Diagnosis not present

## 2021-09-02 DIAGNOSIS — I10 Essential (primary) hypertension: Secondary | ICD-10-CM | POA: Diagnosis not present

## 2021-09-02 DIAGNOSIS — G20 Parkinson's disease: Secondary | ICD-10-CM | POA: Diagnosis not present

## 2021-09-02 DIAGNOSIS — G40909 Epilepsy, unspecified, not intractable, without status epilepticus: Secondary | ICD-10-CM | POA: Diagnosis not present

## 2021-09-02 DIAGNOSIS — R1313 Dysphagia, pharyngeal phase: Secondary | ICD-10-CM | POA: Diagnosis not present

## 2021-09-02 DIAGNOSIS — F0280 Dementia in other diseases classified elsewhere without behavioral disturbance: Secondary | ICD-10-CM | POA: Diagnosis not present

## 2021-09-16 ENCOUNTER — Ambulatory Visit (INDEPENDENT_AMBULATORY_CARE_PROVIDER_SITE_OTHER): Payer: No Typology Code available for payment source

## 2021-09-16 ENCOUNTER — Other Ambulatory Visit: Payer: Self-pay

## 2021-09-16 DIAGNOSIS — R339 Retention of urine, unspecified: Secondary | ICD-10-CM | POA: Diagnosis not present

## 2021-09-16 NOTE — Progress Notes (Signed)
Cath Change/ Replacement  Patient is present today for a catheter change due to urinary retention.  34ml of water was removed from the balloon, a 18FR foley cath was removed with out difficulty.  Patient was cleaned and prepped in a sterile fashion with betadine. A 18 FR foley cath was replaced into the bladder no complications were noted Urine return was noted 66ml and urine was yellow in color. The balloon was filled with 30ml of sterile water. A bedside bag was attached for drainage.  A night bag was also given to the patient and patient was given instruction on how to change from one bag to another. Patient was given proper instruction on catheter care.    Performed by: Estill Bamberg RN  Follow up: 1 month NV cath change

## 2021-09-19 DIAGNOSIS — Z23 Encounter for immunization: Secondary | ICD-10-CM | POA: Diagnosis not present

## 2021-10-15 ENCOUNTER — Other Ambulatory Visit: Payer: Self-pay

## 2021-10-15 ENCOUNTER — Ambulatory Visit (INDEPENDENT_AMBULATORY_CARE_PROVIDER_SITE_OTHER): Payer: No Typology Code available for payment source

## 2021-10-15 DIAGNOSIS — R339 Retention of urine, unspecified: Secondary | ICD-10-CM | POA: Diagnosis not present

## 2021-10-15 NOTE — Patient Instructions (Signed)

## 2021-10-15 NOTE — Progress Notes (Signed)
Suprapubic Cath Change  Patient is present today for a suprapubic catheter change due to urinary retention.  60ml of water was drained from the balloon, a 18FR foley cath was removed from the tract with out difficulty.  Site was cleaned and prepped in a sterile fashion with betadine.  A 18FR foley cath was replaced into the tract no complications were noted. Urine return was noted, 10 ml of sterile water was inflated into the balloon and a bedside bag was attached for drainage.  Patient tolerated well.  Performed by: Arlynn Mcdermid LPN  Follow up: keep next scheduled NV

## 2021-11-12 ENCOUNTER — Other Ambulatory Visit: Payer: Self-pay

## 2021-11-12 ENCOUNTER — Ambulatory Visit (INDEPENDENT_AMBULATORY_CARE_PROVIDER_SITE_OTHER): Payer: No Typology Code available for payment source

## 2021-11-12 DIAGNOSIS — R339 Retention of urine, unspecified: Secondary | ICD-10-CM

## 2021-11-12 NOTE — Progress Notes (Signed)
Cath Change/ Replacement  Patient is present today for a catheter change due to urinary retention.  36ml of water was removed from the balloon, a 18FR foley cath was removed with out difficulty.  Patient was cleaned and prepped in a sterile fashion with betadine. A 18 FR foley cath was replaced into the bladder no complications were noted Urine return was noted 39ml and urine was yellow in color. The balloon was filled with 77ml of sterile water. A bedside bag was attached for drainage.Patient was given proper instruction on catheter care.    Performed by: Estill Bamberg RN  Follow up: 1 month SP tube change

## 2021-12-02 DIAGNOSIS — I509 Heart failure, unspecified: Secondary | ICD-10-CM | POA: Diagnosis not present

## 2021-12-02 DIAGNOSIS — R319 Hematuria, unspecified: Secondary | ICD-10-CM | POA: Diagnosis not present

## 2021-12-02 DIAGNOSIS — Z960 Presence of urogenital implants: Secondary | ICD-10-CM | POA: Diagnosis not present

## 2021-12-02 DIAGNOSIS — U071 COVID-19: Secondary | ICD-10-CM | POA: Diagnosis not present

## 2021-12-03 ENCOUNTER — Ambulatory Visit: Payer: No Typology Code available for payment source

## 2021-12-03 DIAGNOSIS — R300 Dysuria: Secondary | ICD-10-CM | POA: Diagnosis not present

## 2021-12-03 DIAGNOSIS — Z20828 Contact with and (suspected) exposure to other viral communicable diseases: Secondary | ICD-10-CM | POA: Diagnosis not present

## 2021-12-03 DIAGNOSIS — U071 COVID-19: Secondary | ICD-10-CM | POA: Diagnosis not present

## 2021-12-05 DIAGNOSIS — U071 COVID-19: Secondary | ICD-10-CM | POA: Diagnosis not present

## 2021-12-05 DIAGNOSIS — R319 Hematuria, unspecified: Secondary | ICD-10-CM | POA: Diagnosis not present

## 2021-12-24 ENCOUNTER — Ambulatory Visit (INDEPENDENT_AMBULATORY_CARE_PROVIDER_SITE_OTHER): Payer: No Typology Code available for payment source

## 2021-12-24 ENCOUNTER — Other Ambulatory Visit: Payer: Self-pay

## 2021-12-24 DIAGNOSIS — R339 Retention of urine, unspecified: Secondary | ICD-10-CM | POA: Diagnosis not present

## 2021-12-24 NOTE — Patient Instructions (Signed)

## 2021-12-24 NOTE — Progress Notes (Signed)
Suprapubic Cath Change  Patient is present today for a suprapubic catheter change due to urinary retention.  51ml of water was drained from the balloon, a 18FR foley cath was removed from the tract with out difficulty.  Site was cleaned and prepped in a sterile fashion with betadine.  A 18FR foley cath was replaced into the tract no complications were noted. Urine return was noted, 10 ml of sterile water was inflated into the balloon and a bedside bag was attached for drainage.  Patient tolerated well.   Performed by: Shavon Ashmore LPN  Follow up: Keep next scheduled NV

## 2021-12-27 DIAGNOSIS — Z9359 Other cystostomy status: Secondary | ICD-10-CM | POA: Diagnosis not present

## 2021-12-27 DIAGNOSIS — Z515 Encounter for palliative care: Secondary | ICD-10-CM | POA: Diagnosis not present

## 2021-12-27 DIAGNOSIS — I509 Heart failure, unspecified: Secondary | ICD-10-CM | POA: Diagnosis not present

## 2021-12-27 DIAGNOSIS — R532 Functional quadriplegia: Secondary | ICD-10-CM | POA: Diagnosis not present

## 2021-12-27 DIAGNOSIS — Z7401 Bed confinement status: Secondary | ICD-10-CM | POA: Diagnosis not present

## 2021-12-27 DIAGNOSIS — L89151 Pressure ulcer of sacral region, stage 1: Secondary | ICD-10-CM | POA: Diagnosis not present

## 2021-12-27 DIAGNOSIS — I480 Paroxysmal atrial fibrillation: Secondary | ICD-10-CM | POA: Diagnosis not present

## 2021-12-27 DIAGNOSIS — L89321 Pressure ulcer of left buttock, stage 1: Secondary | ICD-10-CM | POA: Diagnosis not present

## 2021-12-27 DIAGNOSIS — Z741 Need for assistance with personal care: Secondary | ICD-10-CM | POA: Diagnosis not present

## 2021-12-27 DIAGNOSIS — L89311 Pressure ulcer of right buttock, stage 1: Secondary | ICD-10-CM | POA: Diagnosis not present

## 2021-12-27 DIAGNOSIS — Z23 Encounter for immunization: Secondary | ICD-10-CM | POA: Diagnosis not present

## 2021-12-27 DIAGNOSIS — G20 Parkinson's disease: Secondary | ICD-10-CM | POA: Diagnosis not present

## 2021-12-27 DIAGNOSIS — Z934 Other artificial openings of gastrointestinal tract status: Secondary | ICD-10-CM | POA: Diagnosis not present

## 2021-12-29 NOTE — Progress Notes (Signed)
Cardiology Office Note:    Date:  01/01/2022   ID:  Cory Pacheco, DOB [birth year], MRN [id number]  PCP:  Richardean Chimera, MD  Cardiologist:  None  Electrophysiologist:  None   Referring MD: Corinna Lines, MD   Chief Complaint  Patient presents with   Atrial Fibrillation    History of Present Illness:    Cory Pacheco is a 76 y.o. male with a hx of Parkinson's disease, hypertension, chronic atrial fibrillation, seizures, recurrent UTIs who is referred by Dr. Ladona Ridgel for evaluation of atrial fibrillation.  Follows with cardiology at [facility] in [place], [place].  He was referred by the [facility] for evaluation of heart failure and Afib, unfortunately we do not have any records.  Echocardiogram 04/11/2021 showed biventricular function, severe left atrial enlargement, moderate right atrial enlargement, mild MR. He is wheelchair-bound and has feeding tube.  He is on Eliquis for his A. fib, denies any bleeding issues.  His wife reports no known history of systolic heart failure.  He takes Lasix 20 mg daily through his feeding tube.  He denies any chest pain, dyspnea, headedness, syncope, lower extreme edema, or palpitations.  His wife brought his home BP log with him, she checks his BP 3 times daily.  Has been very labile, as low as 88/59 and as high as 173/105.  She holds his antihypertensives if SBP less than 90.    BP Readings from Last 3 Encounters:  01/01/22 102/60  08/15/21 112/74  07/25/21 (!) 89/59   Wt Readings from Last 3 Encounters:  05/12/17 246 lb (111.6 kg)  08/03/13 215 lb (97.5 kg)        Past Medical History:  Diagnosis Date   Anxiety    Arthritis    Depression    Hypertension    Seizure (HCC)     Past Surgical History:  Procedure Laterality Date   CHOLECYSTECTOMY     COLONOSCOPY     COLONOSCOPY N/A 08/03/2013   Procedure: COLONOSCOPY;  Surgeon: Malissa Hippo, MD;  [facility];  Service: Endoscopy;  Laterality: N/A;  930   TONSILLECTOMY       Current Medications: Current Meds  Medication Sig   acetaminophen (TYLENOL) 325 MG tablet Take 2 tablets (650 mg total) by mouth every 6 (six) hours as needed for mild pain (temp > 101.5).   apixaban (ELIQUIS) 5 MG TABS tablet by Does not apply route.   Ascorbic Acid (VITAMIN C) 1000 MG tablet Take 1,000 mg by mouth daily.   carbidopa-levodopa (PARCOPA) 25-100 MG disintegrating tablet Take 1 tablet by mouth 4 (four) times daily.   Cholecalciferol (VITAMIN D3) 2000 UNITS TABS Take 1 tablet by mouth daily.   diltiazem (CARDIZEM) 30 MG tablet Take 30 mg by mouth 3 (three) times daily.   fluticasone (FLONASE) 50 MCG/ACT nasal spray Place 2 sprays into both nostrils daily.   folic acid (FOLVITE) 1 MG tablet Take 2 mg by mouth daily.   furosemide (LASIX) 10 MG/ML solution TAKE IN FEEDING TUBE EVERY DAY (20MG  = )   lacosamide (VIMPAT) 10 MG/ML oral solution TAKE IN FEEDING TUBE TWICE A DAY   lansoprazole (PREVACID SOLUTAB) 30 MG disintegrating tablet Take 30 mg by mouth daily at 12 noon.   nystatin (MYCOSTATIN/NYSTOP) powder Apply 1 application topically 3 (three) times daily.   polyethylene glycol (MIRALAX / GLYCOLAX) 17 g packet Take 17 g by mouth daily.   potassium chloride (KLOR-CON M) 10 MEQ tablet Take 10 mEq by  mouth 2 (two) times daily.   risperiDONE (RISPERDAL) 1 MG/ML oral solution Take by mouth at bedtime.   zinc sulfate 220 (50 Zn) MG capsule TAKE ONE CAPSULE IN FEEDING TUBE EVERY DAY   [DISCONTINUED] furosemide (LASIX) 10 MG/ML solution Take 20 mg by mouth daily.   [DISCONTINUED] metoprolol tartrate (LOPRESSOR) 50 MG tablet Take 50 mg by mouth 2 (two) times daily.     Allergies:   Penicillins, Bupropion, Metformin, and Sulfa antibiotics   Social History   Socioeconomic History   Marital status: Married    Spouse name: Not on file   Number of children: Not on file   Years of education: Not on file   Highest education level: Not on file  Occupational History    Not on file  Tobacco Use   Smoking status: Never   Smokeless tobacco: Never  Vaping Use   Vaping Use: Never used  Substance and Sexual Activity   Alcohol use: Never   Drug use: Never   Sexual activity: Yes  Other Topics Concern   Not on file  Social History Narrative   Not on file   Social Determinants of Health   Financial Resource Strain: Not on file  Food Insecurity: Not on file  Transportation Needs: Not on file  Physical Activity: Not on file  Stress: Not on file  Social Connections: Not on file     Family History: The patient's family history is negative for Sudden death, Sickle cell trait, Lupus, and Anesthesia problems.  ROS:   Please see the history of present illness.     All other systems reviewed and are negative.  EKGs/Labs/Other Studies Reviewed:    The following studies were reviewed today:   EKG:  01/01/22: Atrial fibrillation, rate 66, low voltage, no ST abnormalities  Recent Labs: 01/01/2022: ALT 8; BUN 26; Creatinine, Ser 0.74; Hemoglobin 14.6; Platelets 162; Potassium 4.2; Sodium 135; TSH 1.365  Recent Lipid Panel    Component Value Date/Time   CHOL 135 01/01/2022 1039   TRIG 65 01/01/2022 1039   HDL 39 (L) 01/01/2022 1039   CHOLHDL 3.5 01/01/2022 1039   VLDL 13 01/01/2022 1039   LDLCALC 83 01/01/2022 1039    Physical Exam:    VS:  BP 102/60    Pulse 66    Ht 5\' 11"  (1.803 m)    SpO2 96%    BMI 34.31 kg/m     Wt Readings from Last 3 Encounters:  05/12/17 246 lb (111.6 kg)  08/03/13 215 lb (97.5 kg)    GEN:   in no acute distress HEENT: Normal NECK: No JVD; No carotid bruits CARDIAC: irregular, normal rate, no murmurs, rubs, gallops RESPIRATORY:  Clear to auscultation without rales, wheezing or rhonchi  ABDOMEN: Soft, non-tender, non-distended MUSCULOSKELETAL:  No edema; No deformity  SKIN: Warm and dry NEUROLOGIC:  Alert and oriented x 3 PSYCHIATRIC:  Normal affect   ASSESSMENT:    1. Atrial fibrillation, unspecified type  (HCC)   2. Chronic diastolic heart failure (HCC)   3. Essential hypertension   4. Screening for hyperlipidemia    PLAN:    Atrial fibrillation: Chronic A. fib, severe atrial enlargement on echocardiogram 03/2021.  Rates appear controlled.  CHA2DS2-VASc score at least 3 (hypertension, age x2) -Obtain records from cardiology at [facility] in [place] -Continue Eliquis 5 mg twice daily -Appears rate controlled.  On metoprolol 50 mg three times daily and diltiazem 30 mg 3 times daily.  Will check Zio patch x3 days to  evaluate for rate control -Echocardiogram -Check CMET, CBC, TSH  Chronic diastolic heart failure: On Lasix 20 mg daily.  Appears euvolemic. -Check CMET  Hypertension: Has both amlodipine and diltiazem listed on his medication list, but wife reports he is only taking diltiazem.  Takes metoprolol 50 mg 3 times daily and diltiazem 30 mg 3 times daily  Lipid screening: will check lipid panel   RTC in 3 months  Medication Adjustments/Labs and Tests Ordered: Current medicines are reviewed at length with the patient today.  Concerns regarding medicines are outlined above.  Orders Placed This Encounter  Procedures   Comprehensive Metabolic Panel (CMET)   CBC   TSH   Lipid panel   ECHOCARDIOGRAM COMPLETE   Meds ordered this encounter  Medications   metoprolol tartrate (LOPRESSOR) 50 MG tablet    Sig: Take 1 tablet (50 mg total) by mouth 3 (three) times daily.    Dispense:  270 tablet    Refill:  3    Patient Instructions  Medication Instructions:  Your physician recommends that you continue on your current medications as directed. Please refer to the Current Medication list given to you today.  *If you need a refill on your cardiac medications before your next appointment, please call your pharmacy*   Lab Work: CMET CBC TSH LIPID  If you have labs (blood work) drawn today and your tests are completely normal, you will receive your results only by: MyChart Message (if you  have MyChart) OR A paper copy in the mail If you have any lab test that is abnormal or we need to change your treatment, we will call you to review the results.   Testing/Procedures: Your physician has requested that you have an echocardiogram. Echocardiography is a painless test that uses sound waves to create images of your heart. It provides your doctor with information about the size and shape of your heart and how well your hearts chambers and valves are working. This procedure takes approximately one hour. There are no restrictions for this procedure.    Follow-Up: At [facility], you and your health needs are our priority.  As part of our continuing mission to provide you with exceptional heart care, we have created designated Provider Care Teams.  These Care Teams include your primary Cardiologist (physician) and Advanced Practice Providers (APPs -  Physician Assistants and Nurse Practitioners) who all work together to provide you with the care you need, when you need it.  We recommend signing up for the patient portal called "MyChart".  Sign up information is provided on this After Visit Summary.  MyChart is used to connect with patients for Virtual Visits (Telemedicine).  Patients are able to view lab/test results, encounter notes, upcoming appointments, etc.  Non-urgent messages can be sent to your provider as well.   To learn more about what you can do with MyChart, go to [email or web address].    Your next appointment:   3 month(s)  The format for your next appointment:   In Person  Provider:   Epifanio Lesches, MD     Other Instructions Christena Deem- Long Term Monitor Instructions   Your physician has requested you wear your ZIO patch monitor___3____days.   This is a single patch monitor.  [employer] supplies one patch monitor per enrollment.  Additional stickers are not available.   Please do not apply patch if you will be having a Nuclear Stress Test, Echocardiogram,  Cardiac CT, MRI, or Chest Xray during the time frame you would be  wearing the monitor. The patch cannot be worn during these tests.  You cannot remove and re-apply the ZIO XT patch monitor.   Your ZIO patch monitor will be sent USPS Priority mail from [facility] directly to your home address. The monitor may also be mailed to a PO BOX if home delivery is not available.   It may take 3-5 days to receive your monitor after you have been enrolled.   Once you have received you monitor, please review enclosed instructions.  Your monitor has already been registered assigning a specific monitor serial # to you.   Applying the monitor   Shave hair from upper left chest.   Hold abrader disc by orange tab.  Rub abrader in 40 strokes over left upper chest as indicated in your monitor instructions.   Clean area with 4 enclosed alcohol pads .  Use all pads to assure are is cleaned thoroughly.  Let dry.   Apply patch as indicated in monitor instructions.  Patch will be place under collarbone on left side of chest with arrow pointing upward.   Rub patch adhesive wings for 2 minutes.Remove white label marked "1".  Remove white label marked "2".  Rub patch adhesive wings for 2 additional minutes.   While looking in a mirror, press and release button in center of patch.  A small green light will flash 3-4 times .  This will be your only indicator the monitor has been turned on.     Do not shower for the first 24 hours.  You may shower after the first 24 hours.   Press button if you feel a symptom. You will hear a small click.  Record Date, Time and Symptom in the Patient Log Book.   When you are ready to remove patch, follow instructions on last 2 pages of Patient Log Book.  Stick patch monitor onto last page of Patient Log Book.   Place Patient Log Book in [place] box.  Use locking tab on box and tape box closed securely.  The [employer] and [employer] has [employer] on it.  Please place in mailbox  as soon as possible.  Your physician should have your test results approximately 7 days after the monitor has been mailed back to [facility].   Call [facility] Customer Care at [phone] if you have questions regarding your ZIO XT patch monitor.  Call them immediately if you see an orange light blinking on your monitor.   If your monitor falls off in less than 4 days contact our Monitor department at [phone].  If your monitor becomes loose or falls off after 4 days call [employer] at [phone] for suggestions on securing your monitor.      Signed, Little Ishikawa, MD  01/01/2022 1:47 PM    [facility]

## 2022-01-01 ENCOUNTER — Other Ambulatory Visit (HOSPITAL_COMMUNITY)
Admission: RE | Admit: 2022-01-01 | Discharge: 2022-01-01 | Disposition: A | Discharge status: Home / Self Care | Payer: No Typology Code available for payment source | Source: Ambulatory Visit | Attending: Cardiology | Admitting: Cardiology

## 2022-01-01 ENCOUNTER — Encounter: Payer: Self-pay | Admitting: *Deleted

## 2022-01-01 ENCOUNTER — Other Ambulatory Visit: Payer: Self-pay

## 2022-01-01 ENCOUNTER — Ambulatory Visit (INDEPENDENT_AMBULATORY_CARE_PROVIDER_SITE_OTHER): Payer: No Typology Code available for payment source | Admitting: Cardiology

## 2022-01-01 ENCOUNTER — Other Ambulatory Visit: Payer: Self-pay | Admitting: Cardiology

## 2022-01-01 ENCOUNTER — Encounter: Payer: Self-pay | Admitting: Cardiology

## 2022-01-01 ENCOUNTER — Ambulatory Visit (INDEPENDENT_AMBULATORY_CARE_PROVIDER_SITE_OTHER): Payer: No Typology Code available for payment source

## 2022-01-01 VITALS — BP 102/60 | HR 66 | Ht 71.0 in

## 2022-01-01 DIAGNOSIS — I4891 Unspecified atrial fibrillation: Secondary | ICD-10-CM | POA: Diagnosis not present

## 2022-01-01 DIAGNOSIS — I5032 Chronic diastolic (congestive) heart failure: Secondary | ICD-10-CM | POA: Diagnosis not present

## 2022-01-01 DIAGNOSIS — I10 Essential (primary) hypertension: Secondary | ICD-10-CM | POA: Diagnosis not present

## 2022-01-01 DIAGNOSIS — Z13220 Encounter for screening for lipoid disorders: Secondary | ICD-10-CM | POA: Diagnosis not present

## 2022-01-01 LAB — LIPID PANEL
Cholesterol: 135 mg/dL (ref 0–200)
HDL: 39 mg/dL — ABNORMAL LOW (ref >40)
LDL Cholesterol: 83 mg/dL (ref 0–99)
Total CHOL/HDL Ratio: 3.5 RATIO
Triglycerides: 65 mg/dL (ref <150)
VLDL: 13 mg/dL (ref 0–40)

## 2022-01-01 LAB — COMPREHENSIVE METABOLIC PANEL
ALT: 8 U/L (ref 0–44)
AST: 26 U/L (ref 15–41)
Albumin: 3.4 g/dL — ABNORMAL LOW (ref 3.5–5.0)
Alkaline Phosphatase: 138 U/L — ABNORMAL HIGH (ref 38–126)
Anion gap: 11 (ref 5–15)
BUN: 26 mg/dL — ABNORMAL HIGH (ref 8–23)
CO2: 26 mmol/L (ref 22–32)
Calcium: 8.9 mg/dL (ref 8.9–10.3)
Chloride: 98 mmol/L (ref 98–111)
Creatinine, Ser: 0.74 mg/dL (ref 0.61–1.24)
GFR, Estimated: >60 mL/min (ref >60)
Glucose, Bld: 117 mg/dL — ABNORMAL HIGH (ref 70–99)
Potassium: 4.2 mmol/L (ref 3.5–5.1)
Sodium: 135 mmol/L (ref 135–145)
Total Bilirubin: 0.6 mg/dL (ref 0.3–1.2)
Total Protein: 7.1 g/dL (ref 6.5–8.1)

## 2022-01-01 LAB — CBC
HCT: 43.6 % (ref 39.0–52.0)
Hemoglobin: 14.6 g/dL (ref 13.0–17.0)
MCH: 34.2 pg — ABNORMAL HIGH (ref 26.0–34.0)
MCHC: 33.5 g/dL (ref 30.0–36.0)
MCV: 102.1 fL — ABNORMAL HIGH (ref 80.0–100.0)
Platelets: 162 10*3/uL (ref 150–400)
RBC: 4.27 MIL/uL (ref 4.22–5.81)
RDW: 14.2 % (ref 11.5–15.5)
WBC: 8.6 10*3/uL (ref 4.0–10.5)
nRBC: 0 % (ref 0.0–0.2)

## 2022-01-01 LAB — TSH: TSH: 1.365 u[IU]/mL (ref 0.350–4.500)

## 2022-01-01 MED ORDER — METOPROLOL TARTRATE 50 MG PO TABS: 50.0000 mg | ORAL_TABLET | Freq: Three times a day (TID) | ORAL | 3 refills | Status: DC

## 2022-01-01 NOTE — Addendum Note (Signed)
Addended by: Christella Scheuermann C on: 01/01/2022 04:14 PM   Modules accepted: Orders

## 2022-01-01 NOTE — Patient Instructions (Signed)
Medication Instructions:  Your physician recommends that you continue on your current medications as directed. Please refer to the Current Medication list given to you today.  *If you need a refill on your cardiac medications before your next appointment, please call your pharmacy*   Lab Work: CMET CBC TSH LIPID  If you have labs (blood work) drawn today and your tests are completely normal, you will receive your results only by: [place] (if you have MyChart) OR A paper copy in the mail If you have any lab test that is abnormal or we need to change your treatment, we will call you to review the results.   Testing/Procedures: Your physician has requested that you have an echocardiogram. Echocardiography is a painless test that uses sound waves to create images of your heart. It provides your doctor with information about the size and shape of your heart and how well your hearts chambers and valves are working. This procedure takes approximately one hour. There are no restrictions for this procedure.    Follow-Up: At [facility], you and your health needs are our priority.  As part of our continuing mission to provide you with exceptional heart care, we have created designated Provider Care Teams.  These Care Teams include your primary Cardiologist (physician) and Advanced Practice Providers (APPs -  Physician Assistants and Nurse Practitioners) who all work together to provide you with the care you need, when you need it.  We recommend signing up for the patient portal called "MyChart".  Sign up information is provided on this After Visit Summary.  MyChart is used to connect with patients for Virtual Visits (Telemedicine).  Patients are able to view lab/test results, encounter notes, upcoming appointments, etc.  Non-urgent messages can be sent to your provider as well.   To learn more about what you can do with MyChart, go to [email or web address].    Your next appointment:    3 month(s)  The format for your next appointment:   In Person  Provider:   Oswaldo Milian, MD     Other Instructions Bryn Gulling- Long Term Monitor Instructions   Your physician has requested you wear your ZIO patch monitor___3____days.   This is a single patch monitor.  [employer] supplies one patch monitor per enrollment.  Additional stickers are not available.   Please do not apply patch if you will be having a Nuclear Stress Test, Echocardiogram, Cardiac CT, MRI, or Chest Xray during the time frame you would be wearing the monitor. The patch cannot be worn during these tests.  You cannot remove and re-apply the ZIO XT patch monitor.   Your ZIO patch monitor will be sent USPS Priority mail from [facility] directly to your home address. The monitor may also be mailed to a PO BOX if home delivery is not available.   It may take 3-5 days to receive your monitor after you have been enrolled.   Once you have received you monitor, please review enclosed instructions.  Your monitor has already been registered assigning a specific monitor serial # to you.   Applying the monitor   Shave hair from upper left chest.   Hold abrader disc by orange tab.  Rub abrader in 40 strokes over left upper chest as indicated in your monitor instructions.   Clean area with 4 enclosed alcohol pads .  Use all pads to assure are is cleaned thoroughly.  Let dry.   Apply patch as indicated in monitor instructions.  Patch will be  place under collarbone on left side of chest with arrow pointing upward.   Rub patch adhesive wings for 2 minutes.Remove white label marked "1".  Remove white label marked "2".  Rub patch adhesive wings for 2 additional minutes.   While looking in a mirror, press and release button in center of patch.  A small green light will flash 3-4 times .  This will be your only indicator the monitor has been turned on.     Do not shower for the first 24 hours.  You may shower after  the first 24 hours.   Press button if you feel a symptom. You will hear a small click.  Record Date, Time and Symptom in the Patient Log Book.   When you are ready to remove patch, follow instructions on last 2 pages of Patient Log Book.  Stick patch monitor onto last page of Patient Log Book.   Place Patient Log Book in [place] box.  Use locking tab on box and tape box closed securely.  The [employer] and [employer] has [employer] on it.  Please place in mailbox as soon as possible.  Your physician should have your test results approximately 7 days after the monitor has been mailed back to [facility].   Call [place] at [phone] if you have questions regarding your ZIO XT patch monitor.  Call them immediately if you see an orange light blinking on your monitor.   If your monitor falls off in less than 4 days contact our Monitor department at [phone].  If your monitor becomes loose or falls off after 4 days call [employer] at [phone] for suggestions on securing your monitor.

## 2022-01-02 ENCOUNTER — Ambulatory Visit: Payer: No Typology Code available for payment source

## 2022-01-02 ENCOUNTER — Telehealth: Payer: Self-pay | Admitting: Cardiology

## 2022-01-02 NOTE — Telephone Encounter (Signed)
°  Pt's wife called, she said, yesterday pt's zio heart monitor was blinking red lights, then this morning all light are off, she is concern and wanted to know if that's normal

## 2022-01-02 NOTE — Telephone Encounter (Signed)
Patient's wife calling back. She states now the monitor is not doing anything.

## 2022-01-02 NOTE — Telephone Encounter (Signed)
Spoke to pt's wife and verbalized how to troubleshoot heart monitor. Pt's wife will make sure heart monitor is securely attached to pt's skin and call us back if monitor continues to show a red light.

## 2022-01-02 NOTE — Telephone Encounter (Signed)
Spoke with wife who reports the Zio patch is not blinking. I had her place her hands on the device for a few minutes to see if the patch would adhere. It did not. I recommended that she contact [employer] to let them help her with the device. She found the number and stated she would call them.

## 2022-01-22 DIAGNOSIS — I110 Hypertensive heart disease with heart failure: Secondary | ICD-10-CM | POA: Diagnosis not present

## 2022-01-22 DIAGNOSIS — I480 Paroxysmal atrial fibrillation: Secondary | ICD-10-CM | POA: Diagnosis not present

## 2022-01-22 DIAGNOSIS — M6240 Contracture of muscle, unspecified site: Secondary | ICD-10-CM | POA: Diagnosis not present

## 2022-01-22 DIAGNOSIS — G20 Parkinson's disease: Secondary | ICD-10-CM | POA: Diagnosis not present

## 2022-01-22 DIAGNOSIS — Z515 Encounter for palliative care: Secondary | ICD-10-CM | POA: Diagnosis not present

## 2022-01-22 DIAGNOSIS — Z931 Gastrostomy status: Secondary | ICD-10-CM | POA: Diagnosis not present

## 2022-01-22 DIAGNOSIS — I509 Heart failure, unspecified: Secondary | ICD-10-CM | POA: Diagnosis not present

## 2022-01-22 DIAGNOSIS — Z741 Need for assistance with personal care: Secondary | ICD-10-CM | POA: Diagnosis not present

## 2022-01-22 DIAGNOSIS — R5381 Other malaise: Secondary | ICD-10-CM | POA: Diagnosis not present

## 2022-01-22 DIAGNOSIS — R532 Functional quadriplegia: Secondary | ICD-10-CM | POA: Diagnosis not present

## 2022-01-22 DIAGNOSIS — Z7401 Bed confinement status: Secondary | ICD-10-CM | POA: Diagnosis not present

## 2022-01-22 DIAGNOSIS — Z936 Other artificial openings of urinary tract status: Secondary | ICD-10-CM | POA: Diagnosis not present

## 2022-01-23 ENCOUNTER — Ambulatory Visit (INDEPENDENT_AMBULATORY_CARE_PROVIDER_SITE_OTHER): Payer: No Typology Code available for payment source

## 2022-01-23 ENCOUNTER — Other Ambulatory Visit: Payer: Self-pay

## 2022-01-23 DIAGNOSIS — R339 Retention of urine, unspecified: Secondary | ICD-10-CM | POA: Diagnosis not present

## 2022-01-23 NOTE — Patient Instructions (Signed)

## 2022-01-23 NOTE — Progress Notes (Signed)
Suprapubic Cath Change  Patient is present today for a suprapubic catheter change due to urinary retention.  69ml of water was drained from the balloon, a 18FR foley cath was removed from the tract with out difficulty.  Site was cleaned and prepped in a sterile fashion with betadine.  A 18FR foley cath was replaced into the tract no complications were noted. Urine return was noted, 10 ml of sterile water was inflated into the balloon and a bedside bag was attached for drainage.  Patient tolerated well. A night bag was given to patient and proper instruction was given on how to switch bags.    Performed by: Royer Cristobal LPN  Follow up: keep next scheduled NV

## 2022-02-03 ENCOUNTER — Ambulatory Visit (HOSPITAL_COMMUNITY)
Admission: RE | Admit: 2022-02-03 | Discharge: 2022-02-03 | Disposition: A | Discharge status: Home / Self Care | Payer: No Typology Code available for payment source | Source: Ambulatory Visit | Attending: Cardiology | Admitting: Cardiology

## 2022-02-03 ENCOUNTER — Other Ambulatory Visit: Payer: Self-pay

## 2022-02-03 DIAGNOSIS — I4891 Unspecified atrial fibrillation: Secondary | ICD-10-CM

## 2022-02-03 LAB — ECHOCARDIOGRAM COMPLETE
AR max vel: 1.79 cm2
AV Area VTI: 1.77 cm2
AV Area mean vel: 1.78 cm2
AV Mean grad: 4.1 mmHg
AV Peak grad: 7.3 mmHg
Ao pk vel: 1.35 m/s
Area-P 1/2: 3.72 cm2
MV M vel: 5.06 m/s
MV Peak grad: 102.4 mmHg
S' Lateral: 3.5 cm

## 2022-02-03 NOTE — Progress Notes (Signed)
*  PRELIMINARY RESULTS* Echocardiogram 2D Echocardiogram has been performed with patient in wheelchair.  Cory Pacheco 02/03/2022, 11:45 AM

## 2022-02-20 ENCOUNTER — Ambulatory Visit: Payer: No Typology Code available for payment source

## 2022-02-24 ENCOUNTER — Ambulatory Visit (INDEPENDENT_AMBULATORY_CARE_PROVIDER_SITE_OTHER): Payer: No Typology Code available for payment source

## 2022-02-24 ENCOUNTER — Other Ambulatory Visit: Payer: Self-pay

## 2022-02-24 DIAGNOSIS — R339 Retention of urine, unspecified: Secondary | ICD-10-CM

## 2022-02-24 NOTE — Progress Notes (Signed)
Suprapubic Cath Change ? ?Patient is present today for a suprapubic catheter change due to urinary retention.  55m of water was drained from the balloon, a 18FR foley cath was removed from the tract with out difficulty.  Site was cleaned and prepped in a sterile fashion with betadine.  A 18FR foley cath was replaced into the tract no complications were noted. Urine return was noted, 10 ml of sterile water was inflated into the balloon and a bedside bag was attached for drainage.  Patient tolerated well. A night bag was given to patient and proper instruction was given on how to switch bags.   ? ?Performed by: AEstill BambergRN ? ?Follow up: 1 month SP tube change  ?

## 2022-03-03 NOTE — Progress Notes (Signed)
? ?Cardiology Office Note   ? ?Date:  03/10/2022  ? ?ID:  Cory Pacheco, DOB 1946/08/31, MRN [id number] ? ? ?PCP:  Richardean Chimera, MD ?  ?[facility]  ?Cardiologist:  Little Ishikawa, MD   ?Advanced Practice Provider:  No care team member to display ?Electrophysiologist:  None  ? ?[id number]}  ? ?No chief complaint on file. ? ? ?History of Present Illness:  ?Cory Pacheco is a 76 y.o. male with a hx of Parkinson's disease, hypertension, chronic atrial fibrillation, seizures, recurrent UTIs    Follows with cardiology at [facility] in [place], [place].  He was referred by the [facility] for evaluation of heart failure and Afib, unfortunately we do not have any records.  Echocardiogram 04/11/2021 showed biventricular function, severe left atrial enlargement, moderate right atrial enlargement, mild MR. He is wheelchair-bound and has feeding tube.  He is on Eliquis for his A. Fib. BP has been very labile, as low as 88/59 and as high as 173/105.  She holds his antihypertensives if SBP less than 90.  ? ?Patient saw Dr. Bjorn Pippin 01/01/22 and ordered echo and Zio. Echo 02/03/22 LVEF 50-55%, mild MR, ZIO 100% Afib burden well controlled. ? ?Patient comes in with his wife. He's in a wheelchair. No shortness of breath. BP stays low. Left lower leg edema. He's in bed all day. Aide may be putting extra salt on his food. Eating more now and only getting tube feeding at night. All meds go through the tube.  ? ? ? ? ?Past Medical History:  ?Diagnosis Date  ? Anxiety   ? Arthritis   ? Depression   ? Hypertension   ? Seizure (HCC)   ? ? ?Past Surgical History:  ?Procedure Laterality Date  ? CHOLECYSTECTOMY    ? COLONOSCOPY    ? COLONOSCOPY N/A 08/03/2013  ? Procedure: COLONOSCOPY;  Surgeon: Malissa Hippo, MD;  Location[facility];  Service: Endoscopy;  Laterality: N/A;  930  ? TONSILLECTOMY    ? ? ?Current Medications: ?Current Meds  ?Medication Sig  ? acetaminophen (TYLENOL) 325 MG tablet Take 2 tablets (650 mg  total) by mouth every 6 (six) hours as needed for mild pain (temp > 101.5).  ? albuterol (PROVENTIL HFA;VENTOLIN HFA) 108 (90 Base) MCG/ACT inhaler Inhale 2 puffs into the lungs every 6 (six) hours as needed for wheezing or shortness of breath.  ? apixaban (ELIQUIS) 5 MG TABS tablet by Does not apply route.  ? Ascorbic Acid (VITAMIN C) 1000 MG tablet Take 1,000 mg by mouth daily.  ? carbidopa-levodopa (PARCOPA) 25-100 MG disintegrating tablet Take 1 tablet by mouth 4 (four) times daily.  ? Cholecalciferol (VITAMIN D3) 2000 UNITS TABS Take 1 tablet by mouth daily.  ? diltiazem (CARDIZEM) 30 MG tablet Take 30 mg by mouth 3 (three) times daily.  ? divalproex (DEPAKOTE) 500 MG DR tablet Take 1 tablet (500 mg total) by mouth 3 (three) times daily.  ? fluticasone (FLONASE) 50 MCG/ACT nasal spray Place 2 sprays into both nostrils daily.  ? folic acid (FOLVITE) 1 MG tablet Take 2 mg by mouth daily.  ? furosemide (LASIX) 10 MG/ML solution TAKE IN FEEDING TUBE EVERY DAY (20MG  = )  ? lacosamide (VIMPAT) 10 MG/ML oral solution TAKE IN FEEDING TUBE TWICE A DAY  ? lansoprazole (PREVACID SOLUTAB) 30 MG disintegrating tablet Take 30 mg by mouth daily at 12 noon.  ? metoprolol tartrate (LOPRESSOR) 50 MG tablet Take 1 tablet (50 mg  total) by mouth 3 (three) times daily.  ? nystatin (MYCOSTATIN/NYSTOP) powder Apply 1 application topically 3 (three) times daily.  ? protein supplement (PROSOURCE NO CARB) LIQD 30 mLs.  ? risperiDONE (RISPERDAL) 1 MG/ML oral solution Take by mouth at bedtime.  ? zinc sulfate 220 (50 Zn) MG capsule TAKE ONE CAPSULE IN FEEDING TUBE EVERY DAY  ?  ? ?Allergies:   Penicillins, Bupropion, Metformin, and Sulfa antibiotics  ? ?Social History  ? ?Socioeconomic History  ? Marital status: Married  ?  Spouse name: Not on file  ? Number of children: Not on file  ? Years of education: Not on file  ? Highest education level: Not on file  ?Occupational History  ? Not on file  ?Tobacco Use  ? Smoking status:  Never  ? Smokeless tobacco: Never  ?Vaping Use  ? Vaping Use: Never used  ?Substance and Sexual Activity  ? Alcohol use: Never  ? Drug use: Never  ? Sexual activity: Yes  ?Other Topics Concern  ? Not on file  ?Social History Narrative  ? Not on file  ? ?Social Determinants of Health  ? ?Financial Resource Strain: Not on file  ?Food Insecurity: Not on file  ?Transportation Needs: Unmet Transportation Needs  ? Lack of Transportation (Medical): Yes  ? Lack of Transportation (Non-Medical): Yes  ?Physical Activity: Not on file  ?Stress: Not on file  ?Social Connections: Not on file  ?  ? ?Family History:  The patient's  family history includes Cancer in his sister.  ? ?ROS:   ?Please see the history of present illness.    ?ROS All other systems reviewed and are negative. ? ? ?PHYSICAL EXAM:   ?VS:  BP 90/64   Pulse 72   Ht 5\' 11"  (1.803 m)   Wt 247 lb (112 kg)   SpO2 96%   BMI 34.45 kg/m?   ?Physical Exam  ?GEN: Obese, in wheel chair, in no acute distress  ?Neck: no JVD, carotid bruits, or masses ?Cardiac:RRR; no murmurs, rubs, or gallops  ?Respiratory:  clear to auscultation bilaterally, normal work of breathing ?GI: soft, nontender, nondistended, + BS ?Ext: mild left lower ext edema, otherwise without cyanosis, clubbing, or edema, Good distal pulses bilaterally ?Neuro:  Alert and Oriented x 3 ?Psych: euthymic mood, full affect ? ?Wt Readings from Last 3 Encounters:  ?03/10/22 247 lb (112 kg)  ?05/12/17 246 lb (111.6 kg)  ?08/03/13 215 lb (97.5 kg)  ?  ? ? ?Studies/Labs Reviewed:  ? ?EKG:  EKG is not ordered today.    ? ?Recent Labs: ?01/01/2022: ALT 8; BUN 26; Creatinine, Ser 0.74; Hemoglobin 14.6; Platelets 162; Potassium 4.2; Sodium 135; TSH 1.365  ? ?Lipid Panel ?   ?Component Value Date/Time  ? CHOL 135 01/01/2022 1039  ? TRIG 65 01/01/2022 1039  ? HDL 39 (L) 01/01/2022 1039  ? CHOLHDL 3.5 01/01/2022 1039  ? VLDL 13 01/01/2022 1039  ? LDLCALC 83 01/01/2022 1039  ? ? ?Additional studies/ records that were  reviewed today include:  ?Zio 01/10/22 ?100% A-fib burden, average rate 78 bpm ?  ?  ?Patch Wear Time:  4 days and 22 hours (2023-01-18T10:13:21-0500 to 2023-01-23T09:00:48-0500) ?  ?Atrial Fibrillation occurred continuously (100% burden), ranging from 42-138 bpm (avg of 78 bpm). Isolated VEs were rare (<1.0%), and no VE Couplets or VE Triplets were present.  7 patient triggered events, corresponding to A-fib with controlled rates ?  ?Echo 02/03/22 ?IMPRESSIONS  ? ? ? 1. Left ventricular ejection fraction, by  estimation, is 50 to 55%. The  ?left ventricle has low normal function. The left ventricle has no regional  ?wall motion abnormalities. Left ventricular diastolic parameters are  ?indeterminate.  ? 2. Right ventricular systolic function is normal. The right ventricular  ?size is normal.  ? 3. The mitral valve is abnormal. Mild mitral valve regurgitation. No  ?evidence of mitral stenosis.  ? 4. The aortic valve is tricuspid. There is mild calcification of the  ?aortic valve. There is mild thickening of the aortic valve. Aortic valve  ?regurgitation is not visualized. No aortic stenosis is present.  ? ?FINDINGS  ? Left Ventricle: Left ventricular ejection fraction, by estimation, is 50  ?to 55%. The left ventricle has low normal function. The left ventricle has  ?no regional wall motion abnormalities. The left ventricular internal  ?cavity size was normal in size.  ?There is no left ventricular hypertrophy. Left ventricular diastolic  ?parameters are indeterminate.  ? ?Right Ventricle: The right ventricular size is normal. No increase in  ?right ventricular wall thickness. Right ventricular systolic function is  ?normal.  ? ?Left Atrium: Left atrial size was normal in size.  ? ?Right Atrium: Right atrial size was normal in size.  ? ?Pericardium: There is no evidence of pericardial effusion. Presence of  ?epicardial fat layer.  ? ?Mitral Valve: The mitral valve is abnormal. There is mild thickening of  ?the mitral  valve leaflet(s). There is mild calcification of the mitral  ?valve leaflet(s). Mild mitral annular calcification. Mild mitral valve  ?regurgitation. No evidence of mitral  ?valve stenosis.  ? ?Tricuspid Valve: Lowella Dandy

## 2022-03-04 ENCOUNTER — Telehealth: Payer: Self-pay | Admitting: Cardiology

## 2022-03-04 NOTE — Telephone Encounter (Signed)
Returned call to [facility] [place] she states that in the past [facility] transportation at [employer] has used [facility] transportation [phone]. She was just wondering if we are still going to use them or who? ?

## 2022-03-04 NOTE — Telephone Encounter (Signed)
Wife called in to make sure transportation was set for patient. Email paperwork to [email or web address]. Please advise  ?

## 2022-03-05 ENCOUNTER — Telehealth: Payer: Self-pay | Admitting: Licensed Clinical Social Worker

## 2022-03-05 NOTE — Progress Notes (Signed)
?Heart and Vascular Care Navigation ? ?03/05/2022 ? ?Cory Pacheco ?Sep 19, 1946 ?[id number] ? ?Reason for Referral:  ?Transportation Services  ?Engaged with patient by telephone for initial visit for Heart and Vascular Care Coordination. ?                                                                                                  ?Assessment:                                     ?LCSW called [facility] with permission of Care Navigation supervisor to schedule ride for pt.  ?Await cost center to complete.  ? ?I then called pt wife Keyson Hartzel at [phone]. Introduced self, role, reason for call. Confirmed home address and contact number. Pt utilizes [place], [place], [place].  ?I shared with pt wife that [employer] is undergoing systemwide changes. I let her know that we were able to arrange ride through [facility] this afternoon for pt ride on Monday to [facility]. I inquired if pt had ever been screened for eligibility for transportation through the [place]- pt wife states she makes too much for him to be eligible at this time. I explained I would send information about local transportation options. I also clarified that for pts upcoming urology appt that she would need to reach out to that office for options. Pt wife states understanding. I remain available, encouraged her to call me back if any questions or concerns.  ? ?HRT/VAS Care Coordination   ? ? Patients Home Cardiology Office [facility]  ? Outpatient Care Team Social Worker  ? Social Worker Name: Esmeralda Links [facility] [phone]  ? Living arrangements for the past 2 months Single Family Home  ? Lives with: Spouse  ? Patient Current Insurance Coverage VA; Managed Medicare  ? Patient Has Concern With Paying Medical Bills No  ? Does Patient Have Prescription Coverage? Yes  ? Home Assistive Devices/Equipment Wheelchair  ? ?  ? ?Social History:                                                                              ?SDOH Screenings  ? ?Alcohol Screen: Not on file  ?Depression (PHQ2-9): Not on file  ?Financial Resource Strain: Not on file  ?Food Insecurity: Not on file  ?Housing: Not on file  ?Physical Activity: Not on file  ?Social Connections: Not on file  ?Stress: Not on file  ?Tobacco Use: Low Risk   ? Smoking Tobacco Use: Never  ? Smokeless Tobacco Use: Never  ? Passive Exposure: Not on file  ?Transportation Needs: Unmet Transportation Needs  ? Lack of Transportation (Medical): Yes  ? Lack of Transportation (Non-Medical): Yes  ? ? ?SDOH Interventions: ?  Transportation:   Transportation Interventions: Other (Comment) ([facility])  ? ? ?Other Care Navigation Interventions:    ? ?Patient Referred to: [facility]  ? ?Follow-up plan:   ?LCSW was able to provide [facility] with [facility] cost center, they will confirm ride w/ pt wife.  ?I mailed information about [facility] for pt as they do offer wheelchair [place] services.  ? ? ? ?

## 2022-03-10 ENCOUNTER — Ambulatory Visit (HOSPITAL_COMMUNITY)
Admission: RE | Admit: 2022-03-10 | Discharge: 2022-03-10 | Disposition: A | Discharge status: Home / Self Care | Payer: PPO | Source: Ambulatory Visit | Attending: Physician Assistant | Admitting: Physician Assistant

## 2022-03-10 ENCOUNTER — Telehealth: Payer: Self-pay

## 2022-03-10 ENCOUNTER — Ambulatory Visit (INDEPENDENT_AMBULATORY_CARE_PROVIDER_SITE_OTHER): Payer: PPO | Admitting: Physician Assistant

## 2022-03-10 ENCOUNTER — Encounter: Payer: Self-pay | Admitting: Physician Assistant

## 2022-03-10 ENCOUNTER — Other Ambulatory Visit: Payer: Self-pay

## 2022-03-10 VITALS — BP 90/64 | HR 72 | Ht 71.0 in | Wt 247.0 lb

## 2022-03-10 DIAGNOSIS — M79662 Pain in left lower leg: Secondary | ICD-10-CM | POA: Diagnosis not present

## 2022-03-10 DIAGNOSIS — Z7689 Persons encountering health services in other specified circumstances: Secondary | ICD-10-CM

## 2022-03-10 DIAGNOSIS — G20 Parkinson's disease: Secondary | ICD-10-CM

## 2022-03-10 DIAGNOSIS — R600 Localized edema: Secondary | ICD-10-CM | POA: Diagnosis not present

## 2022-03-10 DIAGNOSIS — I10 Essential (primary) hypertension: Secondary | ICD-10-CM | POA: Diagnosis not present

## 2022-03-10 DIAGNOSIS — M7989 Other specified soft tissue disorders: Secondary | ICD-10-CM | POA: Diagnosis not present

## 2022-03-10 DIAGNOSIS — I5032 Chronic diastolic (congestive) heart failure: Secondary | ICD-10-CM

## 2022-03-10 DIAGNOSIS — I4821 Permanent atrial fibrillation: Secondary | ICD-10-CM | POA: Diagnosis not present

## 2022-03-10 NOTE — Telephone Encounter (Signed)
-----   Message from Imogene Burn, PA-C sent at 03/10/2022  2:07 PM EDT ----- ?No DVT on doppler swelling likely from extra salt as discussed thanks ?

## 2022-03-10 NOTE — Patient Instructions (Addendum)
Testing/Procedures: ?Lower Extremity Doppler- Left Leg- to access for DVT. ? ?Follow-Up: ?Follow up with Dr. Oswaldo Milian in 6 months.  ? ?Any Other Special Instructions Will Be Listed Below (If Applicable). ? ? ? ? ?If you need a refill on your cardiac medications before your next appointment, please call your pharmacy. ? ? ? ?Two Gram Sodium Diet 2000 mg ? ?What is Sodium? Sodium is a mineral found naturally in many foods. The most significant source of sodium in the diet is table salt, which is about 40% sodium.  Processed, convenience, and preserved foods also contain a large amount of sodium.  The body needs only 500 mg of sodium daily to function,  A normal diet provides more than enough sodium even if you do not use salt. ? ?Why Limit Sodium? A build up of sodium in the body can cause thirst, increased blood pressure, shortness of breath, and water retention.  Decreasing sodium in the diet can reduce edema and risk of heart attack or stroke associated with high blood pressure.  Keep in mind that there are many other factors involved in these health problems.  Heredity, obesity, lack of exercise, cigarette smoking, stress and what you eat all play a role. ? ?General Guidelines: ?Do not add salt at the table or in cooking.  One teaspoon of salt contains over 2 grams of sodium. ?Read food labels ?Avoid processed and convenience foods ?Ask your dietitian before eating any foods not dicussed in the menu planning guidelines ?Consult your physician if you wish to use a salt substitute or a sodium containing medication such as antacids.  Limit milk and milk products to 16 oz (2 cups) per day. ? ?Shopping Hints: ?READ LABELS!! "Dietetic" does not necessarily mean low sodium. ?Salt and other sodium ingredients are often added to foods during processing. ? ? ? ?Menu Planning Guidelines ?Food Group Choose More Often Avoid  ?Beverages (see also the milk group All fruit juices, low-sodium, salt-free vegetables  juices, low-sodium carbonated beverages Regular vegetable or tomato juices, commercially softened water used for drinking or cooking  ?Breads and Cereals Enriched white, wheat, rye and pumpernickel bread, hard rolls and dinner rolls; muffins, cornbread and waffles; most dry cereals, cooked cereal without added salt; unsalted crackers and breadsticks; low sodium or homemade bread crumbs Bread, rolls and crackers with salted tops; quick breads; instant hot cereals; pancakes; commercial bread stuffing; self-rising flower and biscuit mixes; regular bread crumbs or cracker crumbs  ?Desserts and Sweets Desserts and sweets mad with mild should be within allowance Instant pudding mixes and cake mixes  ?Fats Butter or margarine; vegetable oils; unsalted salad dressings, regular salad dressings limited to 1 Tbs; light, sour and heavy cream Regular salad dressings containing bacon fat, bacon bits, and salt pork; snack dips made with instant soup mixes or processed cheese; salted nuts  ?Fruits Most fresh, frozen and canned fruits Fruits processed with salt or sodium-containing ingredient (some dried fruits are processed with sodium sulfites  ? ? ? ? ? ? ?Vegetables Fresh, frozen vegetables and low- sodium canned vegetables Regular canned vegetables, sauerkraut, pickled vegetables, and others prepared in brine; frozen vegetables in sauces; vegetables seasoned with ham, bacon or salt pork  ?Condiments, Sauces, Miscellaneous ? Salt substitute with physician's approval; pepper, herbs, spices; vinegar, lemon or lime juice; hot pepper sauce; garlic powder, onion powder, low sodium soy sauce (1 Tbs.); low sodium condiments (ketchup, chili sauce, mustard) in limited amounts (1 tsp.) fresh ground horseradish; unsalted tortilla chips, pretzels, potato chips,  popcorn, salsa (1/4 cup) Any seasoning made with salt including garlic salt, celery salt, onion salt, and seasoned salt; sea salt, rock salt, kosher salt; meat tenderizers;  monosodium glutamate; mustard, regular soy sauce, barbecue, sauce, chili sauce, teriyaki sauce, steak sauce, Worcestershire sauce, and most flavored vinegars; canned gravy and mixes; regular condiments; salted snack foods, olives, picles, relish, horseradish sauce, catsup  ? ?Food preparation: Try these seasonings ?Meats:    ?Pork Sage, onion Serve with applesauce  ?Chicken Poultry seasoning, thyme, parsley Serve with cranberry sauce  ?Lamb Curry powder, rosemary, garlic, thyme Serve with mint sauce or jelly  ?Veal Marjoram, basil Serve with current jelly, cranberry sauce  ?Beef Pepper, bay leaf Serve with dry mustard, unsalted chive butter  ?[place] leaf, dill Serve with unsalted lemon butter, unsalted parsley butter  ?Vegetables:    ?Asparagus Lemon juice   ?Broccoli Lemon juice   ?Carrots Mustard dressing parsley, mint, nutmeg, glazed with unsalted butter and sugar   ?Green beans Marjoram, lemon juice, nutmeg,dill seed   ?Tomatoes Basil, marjoram, onion   ?Spice /blend for "Salt Shaker" 4 tsp ground thyme ?1 tsp ground sage 3 tsp ground rosemary ?4 tsp ground marjoram  ? ?Test your knowledge ?A product that says "Salt Free" may still contain sodium. True or False ?Garlic Powder and Hot Pepper Sauce an be used as alternative seasonings.True or False ?Processed foods have more sodium than fresh foods.  True or False ?Canned Vegetables have less sodium than froze True or False ? ? ?WAYS TO DECREASE YOUR SODIUM INTAKE ?Avoid the use of added salt in cooking and at the table.  Table salt (and other prepared seasonings which contain salt) is probably one of the greatest sources of sodium in the diet.  Unsalted foods can gain flavor from the sweet, sour, and butter taste sensations of herbs and spices.  Instead of using salt for seasoning, try the following seasonings with the foods listed.  Remember: how you use them to enhance natural food flavors is limited only by your creativity... ?Allspice-Meat, fish, eggs,  fruit, peas, red and yellow vegetables ?Almond Extract-Fruit baked goods ?Anise Seed-Sweet breads, fruit, carrots, beets, cottage cheese, cookies (tastes like licorice) ?Basil-Meat, fish, eggs, vegetables, rice, vegetables salads, soups, sauces ?Bay Leaf-Meat, fish, stews, poultry ?Burnet-Salad, vegetables (cucumber-like flavor) ?Caraway Seed-Bread, cookies, cottage cheese, meat, vegetables, cheese, rice ?Cardamon-Baked goods, fruit, soups ?Celery Powder or seed-Salads, salad dressings, sauces, meatloaf, soup, bread.Do not use  celery salt ?Chervil-Meats, salads, fish, eggs, vegetables, cottage cheese (parsley-like flavor) ?Chili Power-Meatloaf, chicken cheese, corn, eggplant, egg dishes ?Chives-Salads cottage cheese, egg dishes, soups, vegetables, sauces ?Cilantro-Salsa, casseroles ?Cinnamon-Baked goods, fruit, pork, lamb, chicken, carrots ?Cloves-Fruit, baked goods, fish, pot roast, green beans, beets, carrots ?Coriander-Pastry, cookies, meat, salads, cheese (lemon-orange flavor) ?Cumin-Meatloaf, fish,cheese, eggs, cabbage,fruit pie (caraway flavor) ?[employer], fruit, eggs, fish, poultry, cottage cheese, vegetables ?Dill Seed-Meat, cottage cheese, poultry, vegetables, fish, salads, bread ?Fennel Seed-Bread, cookies, apples, pork, eggs, fish, beets, cabbage, cheese, Licorice-like flavor ?Garlic-(buds or powder) Salads, meat, poultry, fish, bread, butter, vegetables, potatoes.Do not  use garlic salt ?Ginger-Fruit, vegetables, baked goods, meat, fish, poultry ?Horseradish Root-Meet, vegetables, butter ?Lemon Juice or Extract-Vegetables, fruit, tea, baked goods, fish salads ?Mace-Baked goods fruit, vegetables, fish, poultry (taste like nutmeg) ?Maple Extract-Syrups ?Marjoram-Meat, chicken, fish, vegetables, breads, green salads (taste like Sage) ?Mint-Tea, lamb, sherbet, vegetables, desserts, carrots, cabbage ?Mustard, Dry or Seed-Cheese, eggs, meats, vegetables, poultry ?Nutmeg-Baked goods, fruit, chicken,  eggs, vegetables, desserts ?Onion Powder-Meat, fish, poultry, vegetables, cheese, eggs, bread, rice salads (  Do not use  ? Onion salt) ?Orange Extract-Desserts, baked goods ?Oregano-Pasta, eggs, cheese, onions, pork, lam

## 2022-03-10 NOTE — Addendum Note (Signed)
Addended by: Christella Scheuermann C on: 03/10/2022 12:04 PM ? ? Modules accepted: Orders ? ?

## 2022-03-10 NOTE — Telephone Encounter (Signed)
Patient's wife notified and verbalized understanding. Pt's spouse had no questions or concerns at this time.  ?

## 2022-03-11 NOTE — Telephone Encounter (Signed)
Pt arrived at appt 03-10-22 ?

## 2022-03-18 DIAGNOSIS — Z8619 Personal history of other infectious and parasitic diseases: Secondary | ICD-10-CM | POA: Diagnosis not present

## 2022-03-18 DIAGNOSIS — Z931 Gastrostomy status: Secondary | ICD-10-CM | POA: Diagnosis not present

## 2022-03-18 DIAGNOSIS — I110 Hypertensive heart disease with heart failure: Secondary | ICD-10-CM | POA: Diagnosis not present

## 2022-03-18 DIAGNOSIS — Z87440 Personal history of urinary (tract) infections: Secondary | ICD-10-CM | POA: Diagnosis not present

## 2022-03-18 DIAGNOSIS — I509 Heart failure, unspecified: Secondary | ICD-10-CM | POA: Diagnosis not present

## 2022-03-18 DIAGNOSIS — Z9359 Other cystostomy status: Secondary | ICD-10-CM | POA: Diagnosis not present

## 2022-03-18 DIAGNOSIS — R532 Functional quadriplegia: Secondary | ICD-10-CM | POA: Diagnosis not present

## 2022-03-18 DIAGNOSIS — G20 Parkinson's disease: Secondary | ICD-10-CM | POA: Diagnosis not present

## 2022-03-18 DIAGNOSIS — N3020 Other chronic cystitis without hematuria: Secondary | ICD-10-CM | POA: Diagnosis not present

## 2022-03-18 DIAGNOSIS — Z515 Encounter for palliative care: Secondary | ICD-10-CM | POA: Diagnosis not present

## 2022-03-18 DIAGNOSIS — Z741 Need for assistance with personal care: Secondary | ICD-10-CM | POA: Diagnosis not present

## 2022-03-18 DIAGNOSIS — Z7401 Bed confinement status: Secondary | ICD-10-CM | POA: Diagnosis not present

## 2022-03-20 ENCOUNTER — Ambulatory Visit (INDEPENDENT_AMBULATORY_CARE_PROVIDER_SITE_OTHER): Payer: No Typology Code available for payment source | Admitting: Physician Assistant

## 2022-03-20 DIAGNOSIS — N3001 Acute cystitis with hematuria: Secondary | ICD-10-CM

## 2022-03-20 DIAGNOSIS — N3020 Other chronic cystitis without hematuria: Secondary | ICD-10-CM

## 2022-03-20 DIAGNOSIS — R339 Retention of urine, unspecified: Secondary | ICD-10-CM | POA: Diagnosis not present

## 2022-03-20 LAB — URINALYSIS, ROUTINE W REFLEX MICROSCOPIC
Bilirubin, UA: NEGATIVE
Glucose, UA: NEGATIVE
Ketones, UA: NEGATIVE
Nitrite, UA: POSITIVE — AB
Specific Gravity, UA: 1.02 (ref 1.005–1.030)
Urobilinogen, Ur: 1 mg/dL (ref 0.2–1.0)
pH, UA: 6 (ref 5.0–7.5)

## 2022-03-20 LAB — MICROSCOPIC EXAMINATION
RBC, Urine: >30 /hpf — AB (ref 0–2)
Renal Epithel, UA: NONE SEEN /hpf
WBC, UA: >30 /hpf — AB (ref 0–5)

## 2022-03-20 MED ORDER — DOXYCYCLINE HYCLATE 100 MG PO CAPS: 100.0000 mg | ORAL_CAPSULE | Freq: Two times a day (BID) | ORAL | 0 refills | Status: DC

## 2022-03-20 NOTE — Progress Notes (Signed)
? ?Assessment: ?1. Acute cystitis with hematuria   ?2. Urinary retention   ?3. Chronic cystitis   ? ? ?Plan: ?Doxy sent to pharm. Urine Cx ordered.  Will adjust therapy if indicated by culture.  Should he develop pain, fever, change in mental status, his wife is advised to take him immediately to the emergency department.  FU 1 month for SP cath change. ? ?Chief Complaint: ?No chief complaint on file. ? ? ?HPI: ?Cory Pacheco is a 76 y.o. male with history of bladder stones and urinary retention who presents for evaluation of issues with his SP tube.  He is due for SP tube change next week.  Patient is unable to give history due to current medical conditions.  The patient's wife reports 2 days ago there was a decrease in output and home health nursing was unable to perform installation or obtain output from the catheter today.  The patient has had significant incontinence from the urethra since the SP tube stopped functioning.  The patient's wife states he has had no fever, vomiting, other symptoms of illness.  He denies pain.  He has had some gross hematuria.  His wife also states he was treated for a UTI by Dr. Quillian Quince a few months ago with good resolution of patient's symptoms of cystitis and gross hematuria. ?UA = greater than 30 WBCs, greater than 30 RBCs, few bacteria, nitrite positive ? ?Portions of the above documentation were copied from a prior visit for review purposes only. ? ?Allergies: ?Allergies  ?Allergen Reactions  ? Penicillins Hives  ? Bupropion Other (See Comments)  ?  Patient not sure  ? Metformin Other (See Comments)  ?  Patient not sure  ? Sulfa Antibiotics Other (See Comments)  ?  Patient not sure  ? ? ?PMH: ?Past Medical History:  ?Diagnosis Date  ? Anxiety   ? Arthritis   ? Depression   ? Hypertension   ? Seizure ([place])   ? ? ?PSH: ?Past Surgical History:  ?Procedure Laterality Date  ? CHOLECYSTECTOMY    ? COLONOSCOPY    ? COLONOSCOPY N/A 08/03/2013  ? Procedure: COLONOSCOPY;  Surgeon:  Rogene Houston, MD;  Location: [facility];  Service: Endoscopy;  Laterality: N/A;  930  ? TONSILLECTOMY    ? ? ?SH: ?Social History  ? ?Tobacco Use  ? Smoking status: Never  ? Smokeless tobacco: Never  ?Vaping Use  ? Vaping Use: Never used  ?Substance Use Topics  ? Alcohol use: Never  ? Drug use: Never  ? ? ?ROS: ?Constitutional:  Negative for fever, chills, weight loss ?CV: Negative for chest pain ?Respiratory:  Negative for shortness of breath, wheezing, sleep apnea, frequent cough ?GI:  Negative for nausea, vomiting, bloody stool, GERD ? ?PE: ?GENERAL APPEARANCE: Chronically ill-appearing, in wheelchair.  G-tube intact.  SP tube removed and reinserted easily. ?HEENT:  Atraumatic, normocephalic ?NECK:  Supple. Trachea midline ?ABDOMEN:  Soft, non-tender, no masses ?MENTAL STATUS: Alert ?SKIN:  Warm, dry, and intact ? ? ?Results: ?Laboratory Data: ?Lab Results  ?Component Value Date  ? WBC 8.6 01/01/2022  ? HGB 14.6 01/01/2022  ? HCT 43.6 01/01/2022  ? MCV 102.1 (H) 01/01/2022  ? PLT 162 01/01/2022  ? ? ?Lab Results  ?Component Value Date  ? CREATININE 0.74 01/01/2022  ? ? ?Urinalysis ?   ?Component Value Date/Time  ? [place] (A) 05/28/2021 1631  ? APPEARANCEUR HAZY (A) 05/28/2021 1631  ? LABSPEC 1.008 05/28/2021 1631  ? PHURINE 7.0 05/28/2021 1631  ?  GLUCOSEU NEGATIVE 05/28/2021 1631  ? HGBUR MODERATE (A) 05/28/2021 1631  ? [place] NEGATIVE 05/28/2021 1631  ? [place] NEGATIVE 05/28/2021 1631  ? PROTEINUR 100 (A) 05/28/2021 1631  ? NITRITE NEGATIVE 05/28/2021 1631  ? LEUKOCYTESUR MODERATE (A) 05/28/2021 1631  ? ? ?Lab Results  ?Component Value Date  ? BACTERIA RARE (A) 05/28/2021  ? ? ?Pertinent Imaging: ? ? ?

## 2022-03-20 NOTE — Progress Notes (Signed)
Suprapubic Cath Change ? ?Patient is present today for a suprapubic catheter change due to urinary retention.  72m of water was drained from the balloon, a 18FR foley cath was removed from the tract with out difficulty.  Site was cleaned and prepped in a sterile fashion with betadine.  A 18FR foley cath was replaced into the tract no complications were noted. Urine return was noted, 10 ml of sterile water was inflated into the balloon and a bed bag was attached for drainage.  Patient tolerated well. A night bag was given to patient and proper instruction was given on how to switch bags.  Catheter flushed without any issues. ? ?Performed by: KLevi Aland CMA ? ?Follow up: Follow up in 1 month for sp change  ?

## 2022-03-22 LAB — URINE CULTURE

## 2022-03-24 ENCOUNTER — Ambulatory Visit: Payer: No Typology Code available for payment source

## 2022-03-25 ENCOUNTER — Telehealth: Payer: Self-pay

## 2022-03-25 ENCOUNTER — Ambulatory Visit: Payer: No Typology Code available for payment source | Admitting: Physician Assistant

## 2022-03-25 NOTE — Telephone Encounter (Signed)
-----   Message from Reynaldo Minium, [place] sent at 03/25/2022 11:27 AM EDT ----- ?Please let the pt know his urine cx grew more than one bacteria. If he is improving, we need to recheck a urine after he completes the Doxy. If he is symptomatic, he needs to drop a UA ASAP so we can order an MDX Cx ?----- Message ----- ?From: Interface, [facility] Lab Results In ?Sent: 03/20/2022   4:40 PM EDT ?To: Berneice Heinrich Summerlin, PA-C ? ? ?

## 2022-03-25 NOTE — Telephone Encounter (Signed)
Left message to return call to office.

## 2022-03-25 NOTE — Telephone Encounter (Signed)
Wife reports improvement in urinary symptoms and urine color.  ?

## 2022-03-28 ENCOUNTER — Encounter (HOSPITAL_COMMUNITY): Payer: Self-pay | Admitting: *Deleted

## 2022-03-28 ENCOUNTER — Emergency Department (HOSPITAL_COMMUNITY)
Admission: EM | Admit: 2022-03-28 | Discharge: 2022-03-28 | Disposition: A | Discharge status: Home / Self Care | Payer: No Typology Code available for payment source | Attending: Emergency Medicine | Admitting: Emergency Medicine

## 2022-03-28 DIAGNOSIS — Y730 Diagnostic and monitoring gastroenterology and urology devices associated with adverse incidents: Secondary | ICD-10-CM | POA: Insufficient documentation

## 2022-03-28 DIAGNOSIS — T83098A Other mechanical complication of other indwelling urethral catheter, initial encounter: Secondary | ICD-10-CM | POA: Insufficient documentation

## 2022-03-28 DIAGNOSIS — T83010A Breakdown (mechanical) of cystostomy catheter, initial encounter: Secondary | ICD-10-CM

## 2022-03-28 MED ORDER — PREDNISONE 50 MG PO TABS: 60.0000 mg | ORAL_TABLET | Freq: Once | ORAL | Status: DC

## 2022-03-28 MED ORDER — ALBUTEROL SULFATE (2.5 MG/3ML) 0.083% IN NEBU: 5.0000 mg | INHALATION_SOLUTION | Freq: Once | RESPIRATORY_TRACT | Status: DC

## 2022-03-28 MED ORDER — IPRATROPIUM BROMIDE 0.02 % IN SOLN: 0.5000 mg | Freq: Once | RESPIRATORY_TRACT | Status: DC

## 2022-03-28 NOTE — Discharge Instructions (Signed)
We sent a urine culture after changing the Foley catheter.  We will contact you if he needs to be treated for infection.  In the meantime make sure he is eating and drinking regularly and taking his usual medicines.  Return here if needed for problems. ?

## 2022-03-28 NOTE — ED Notes (Signed)
Unable to irrigate cath. Noted patient to be saturated with urine in brief. MD made aware. Called for urology cart.  ?

## 2022-03-28 NOTE — ED Notes (Signed)
Patient scanned with less than 50 ml in bladder.  ?

## 2022-03-28 NOTE — ED Provider Notes (Signed)
?[facility] ?Provider Note ? ? ?CSN: [id number] ?Arrival date & time: 03/28/22  1255 ? ?  ? ?History ? ?No chief complaint on file. ? ? ?Cory Pacheco is a 76 y.o. male. ? ?HPI ?Patient presenting for evaluation of nonfunctional suprapubic drainage tube.  He had it changed on 03/20/2022.  At that time a Foley catheter, 18-gauge, was placed.  He was started on doxycycline and urine culture was sent.  The culture was positive for greater than 2 organisms therefore was considered not infected. ? ?Home Medications ?Prior to Admission medications   ?Medication Sig Start Date End Date Taking? Authorizing Provider  ?acetaminophen (TYLENOL) 160 MG/5ML elixir Take 650 mg by mouth every 4 (four) hours as needed for fever.   Yes [provider]  ?albuterol (PROVENTIL HFA;VENTOLIN HFA) 108 (90 Base) MCG/ACT inhaler Inhale 2 puffs into the lungs every 6 (six) hours as needed for wheezing or shortness of breath.   Yes [provider]  ?apixaban (ELIQUIS) 5 MG TABS tablet 5 mg by Does not apply route 2 (two) times daily. 01/18/21  Yes [provider]  ?Ascorbic Acid (VITAMIN C) 1000 MG tablet Take 1,000 mg by mouth daily.   Yes [provider]  ?carbidopa-levodopa (PARCOPA) 25-100 MG disintegrating tablet Take 2 tablets by mouth 4 (four) times daily.   Yes [provider]  ?diltiazem (CARDIZEM) 30 MG tablet Take 30 mg by mouth 3 (three) times daily.   Yes [provider]  ?Divalproex Sodium (DEPAKOTE PO) 5 mLs by Feeding Tube route 3 (three) times daily. 250/5 mls   Yes [provider]  ?fluticasone (FLONASE) 50 MCG/ACT nasal spray Place 2 sprays into both nostrils daily. 11/25/21  Yes [provider]  ?folic acid (FOLVITE) 1 MG tablet Take 2 mg by mouth daily.   Yes [provider]  ?furosemide (LASIX) 10 MG/ML solution Place 20 mg into feeding tube daily. 11/25/21  Yes [provider]  ?lacosamide (VIMPAT) 10 MG/ML oral  solution Take by mouth See admin instructions. 20 ml by mouth twice a day 11/25/21  Yes [provider]  ?lansoprazole (PREVACID SOLUTAB) 30 MG disintegrating tablet Take 30 mg by mouth 2 (two) times daily.   Yes [provider]  ?metoprolol tartrate (LOPRESSOR) 50 MG tablet Take 1 tablet (50 mg total) by mouth 3 (three) times daily. 01/01/22  Yes Donato Heinz, MD  ?Nutritional Supplements (FEEDING SUPPLEMENT, OSMOLITE 1.2 CAL,) LIQD Place into feeding tube daily. 2 quarts every day   Yes [provider]  ?nystatin (MYCOSTATIN/NYSTOP) powder Apply 1 application topically 3 (three) times daily.   Yes [provider]  ?protein supplement (PROSOURCE NO CARB) LIQD 30 mLs.   Yes [provider]  ?risperiDONE (RISPERDAL) 1 MG/ML oral solution Take by mouth See admin instructions. Give 1 and 1/2 ml s by route of feeding tube twice a day.   Yes [provider]  ?zinc sulfate 220 (50 Zn) MG capsule Take 220 mg by mouth daily. 11/25/21  Yes [provider]  ?Cholecalciferol (VITAMIN D3) 2000 UNITS TABS Take 1 tablet by mouth daily. ?Patient not taking: Reported on 03/28/2022    [provider]  ?divalproex (DEPAKOTE) 500 MG DR tablet Take 1 tablet (500 mg total) by mouth 3 (three) times daily. ?Patient not taking: Reported on 03/28/2022 05/12/17   Elgergawy, Silver Huguenin, MD  ?doxycycline (VIBRAMYCIN) 100 MG capsule Take 1 capsule (100 mg total) by mouth every 12 (twelve) hours. ?Patient not  taking: Reported on 03/28/2022 03/20/22   Summerlin, Berneice Heinrich, PA-C  ?   ? ?Allergies    ?Penicillins, Bupropion, Metformin, and Sulfa antibiotics   ? ?Review of Systems   ?Review of Systems ? ?Physical Exam ?Updated Vital Signs ?BP 120/82   Pulse 90   Temp 97.8 ?F (36.6 ?C) (Oral)   Resp 18   SpO2 100%  ?Physical Exam ?Vitals and nursing note reviewed.  ?Constitutional:   ?   General: He is not in acute distress. ?   Appearance: He is well-developed. He  is not ill-appearing or diaphoretic.  ?   Comments: Elderly, frail  ?HENT:  ?   Head: Normocephalic and atraumatic.  ?   Right Ear: External ear normal.  ?   Left Ear: External ear normal.  ?Eyes:  ?   Conjunctiva/sclera: Conjunctivae normal.  ?   Pupils: Pupils are equal, round, and reactive to light.  ?Neck:  ?   Trachea: Phonation normal.  ?Cardiovascular:  ?   Rate and Rhythm: Normal rate.  ?Pulmonary:  ?   Effort: Pulmonary effort is normal.  ?Abdominal:  ?   General: There is no distension.  ?Genitourinary: ?   Comments: Foley catheter inside for suprapubic ostomy, site implants appear normal.  He is draining urine around the catheter, soaking his adult diaper. ?Musculoskeletal:     ?   General: Normal range of motion.  ?   Cervical back: Normal range of motion and neck supple.  ?Skin: ?   General: Skin is warm and dry.  ?Neurological:  ?   Mental Status: He is alert.  ?   Cranial Nerves: No cranial nerve deficit.  ?   Motor: No abnormal muscle tone.  ?   Coordination: Coordination normal.  ?   Comments: Mild tremor consistent with Parkinson's disease  ?Psychiatric:     ?   Mood and Affect: Mood normal.     ?   Behavior: Behavior normal.  ? ? ?ED Results / Procedures / Treatments   ?Labs ?(all labs ordered are listed, but only abnormal results are displayed) ?Labs Reviewed  ?URINE CULTURE  ? ? ?EKG ?None ? ?Radiology ?No results found. ? ?Procedures ?SUPRAPUBIC TUBE PLACEMENT ? ?Date/Time: 03/28/2022 5:35 PM ?Performed by: Daleen Bo, MD ?Authorized by: Daleen Bo, MD  ? ?Consent:  ?  Consent obtained:  Verbal ?  Risks, benefits, and alternatives were discussed: yes   ?  Risks discussed:  Bleeding and pain ?  Alternatives discussed:  No treatment ?Universal protocol:  ?  Patient identity confirmed:  Verbally with patient ?Sedation:  ?  Sedation type:  None ?Anesthesia:  ?  Anesthesia method:  None ?Procedure details:  ?  Complexity:  Simple ?  Catheter type:  Foley ?  Catheter size:  18 Fr ?  Number of  attempts:  1 ?Post-procedure details:  ?  Procedure completion:  Tolerated well, no immediate complications  ? ? ?Medications Ordered in ED ?Medications - No data to display ? ?ED Course/ Medical Decision Making/ A&P ?  ?                        ?Medical Decision Making ?Patient presenting for evaluation of nonfunctioning suprapubic urinary catheter.  He is leaking around the tube but nothing is coming out of the tube since last evening.  Catheter was last changed, 03/20/2022.  He was treated for possible UTI, with doxycycline but the culture did not show unique organism.  His wife is seeing sediment in the urine. ? ?Problems Addressed: ?Suprapubic catheter dysfunction, initial encounter [facility]): acute illness or injury ?   Details: Nonfunctioning since yesterday, leaking around tube ? ?Amount and/or Complexity of Data Reviewed ?Independent Historian: caregiver ?   Details: Wife gives history at bedside ?External Data Reviewed: labs and notes. ?   Details: Urology notes and urine culture. ?Labs: ordered. ?   Details: Urine culture ? ?Risk ?Decision regarding hospitalization. ?Risk Details: Patient presenting with nonfunctioning catheter which was exchanged without application.  He does not have any associated signs or symptoms of infection.  Doubt sepsis, UTI, metabolic disorders.  No evidence for intra-abdominal infections, or obstructions.  No indication for hospitalization or further ED evaluation. ? ? ? ? ? ? ? ? ? ? ?Final Clinical Impression(s) / ED Diagnoses ?Final diagnoses:  ?Suprapubic catheter dysfunction, initial encounter ([place])  ? ? ?Rx / DC Orders ?ED Discharge Orders   ? ? None  ? ?  ? ? ?  ?Daleen Bo, MD ?03/29/22 1133 ? ?

## 2022-03-28 NOTE — ED Triage Notes (Signed)
Supra pubic catheter clogged ?

## 2022-03-31 LAB — URINE CULTURE: Culture: >=100000 — AB

## 2022-04-01 ENCOUNTER — Other Ambulatory Visit: Payer: Self-pay

## 2022-04-01 ENCOUNTER — Telehealth: Payer: Self-pay | Admitting: *Deleted

## 2022-04-01 ENCOUNTER — Telehealth: Payer: Self-pay

## 2022-04-01 MED ORDER — CEPHALEXIN 500 MG PO CAPS: 500.0000 mg | ORAL_CAPSULE | Freq: Three times a day (TID) | ORAL | 0 refills | Status: AC

## 2022-04-01 NOTE — Telephone Encounter (Signed)
Post ED Visit - Positive Culture Follow-up ? ?Culture report reviewed by antimicrobial stewardship pharmacist: ?[place] Team ?'[]'$  Elenor Quinones, Pharm.D. ?'[]'$  Heide Guile, Pharm.D., BCPS AQ-ID ?'[]'$  Parks Neptune, Pharm.D., BCPS ?'[]'$  Alycia Rossetti, Pharm.D., BCPS ?'[]'$  Mamou, Pharm.D., BCPS, AAHIVP ?'[]'$  Legrand Como, Pharm.D., BCPS, AAHIVP ?'[]'$  Salome Arnt, PharmD, BCPS ?'[]'$  Johnnette Gourd, PharmD, BCPS ?'[]'$  Hughes Better, PharmD, BCPS ?'[]'$  Leeroy Cha, PharmD ?'[]'$  Laqueta Linden, PharmD, BCPS ?'[]'$  Albertina Parr, PharmD ? ?[place] Team ?'[]'$  Leodis Sias, PharmD ?'[]'$  Lindell Spar, PharmD ?'[]'$  Royetta Asal, PharmD ?'[]'$  Graylin Shiver, Rph ?'[]'$  Rema Fendt) Glennon Mac, PharmD ?'[]'$  Arlyn Dunning, PharmD ?'[]'$  Netta Cedars, PharmD ?'[]'$  Dia Sitter, PharmD ?'[]'$  Leone Haven, PharmD ?'[]'$  Gretta Arab, PharmD ?'[]'$  Theodis Shove, PharmD ?'[]'$  Peggyann Juba, PharmD ?'[]'$  Reuel Boom, PharmD ? ? ?Positive urine culture ?No UTI symptoms at present and no further patient follow-up is required at this time.  Quentin Mulling, PA-C ? ?Ardeen Fillers ?04/01/2022, 11:57 AM ?  ?

## 2022-04-01 NOTE — Telephone Encounter (Signed)
-----   Message from Irine Seal, MD sent at 04/01/2022  9:10 AM EDT ----- ?He probably needs to have cystoscopy of the tract and bladder to make sure he doesn't have a lot of stone material in the bladder.  Please get that appointment set up.  He has a PCN allergy but it was just hives so we can have him take keflex '500mg'$  po tid #21 starting about 3 days prior to the cystoscopy.  ? ?----- Message ----- ?From: Iris Pert, LPN ?Sent: 03/31/2022   4:44 PM EDT ?To: Irine Seal, MD, Dorisann Frames, RN ? ?Pt went to ER due to clogged catheter.  ?Culture done.  ?Hospital started no treatment.  ?Pt completed doxycycline 4/13. ?Please advise. ? ?

## 2022-04-01 NOTE — Telephone Encounter (Signed)
Rx sent in. ?Wife called and made aware, ?

## 2022-04-01 NOTE — Progress Notes (Signed)
ED Antimicrobial Stewardship Positive Culture Follow Up  ? ?Cory Pacheco is an 76 y.o. male who presented to [facility] on 03/28/2022 with a chief complaint of No chief complaint on file. ? ? ?Recent Results (from the past 720 hour(s))  ?Urine Culture     Status: None  ? Collection Time: 03/20/22 11:49 AM  ? Specimen: Urine  ? Urine  ?Result Value Ref Range Status  ? Urine Culture, Routine Final report  Final  ? Organism ID, Bacteria Comment  Final  ?  Comment: Greater than 2 organisms recovered, none predominant. Please submit ?another sample if clinically indicated. ?Greater than 100,000 colony forming units per mL ?  ?Microscopic Examination     Status: Abnormal  ? Collection Time: 03/20/22 11:49 AM  ? Urine  ?Result Value Ref Range Status  ? WBC, UA >30 (A) 0 - 5 /hpf Final  ? RBC >30 (A) 0 - 2 /hpf Final  ? Epithelial Cells (non renal) 0-10 0 - 10 /hpf Final  ? Renal Epithel, UA None seen None seen /hpf Final  ? Bacteria, UA Few (A) None seen/Few Final  ?Urine Culture     Status: Abnormal  ? Collection Time: 03/28/22  5:33 PM  ? Specimen: Urine, Catheterized  ?Result Value Ref Range Status  ? Specimen Description   Final  ?  URINE, CATHETERIZED ?Performed at [facility], [address] [place], [place] [address] ?  ? Special Requests   Final  ?  NONE ?Performed at [facility], [address] [place], [place] [address] ?  ? Culture >=100,000 COLONIES/mL PROTEUS MIRABILIS (A)  Final  ? Report Status 03/31/2022 FINAL  Final  ? Organism ID, Bacteria PROTEUS MIRABILIS (A)  Final  ?    Susceptibility  ? Proteus mirabilis - MIC*  ?  AMPICILLIN <=2 SENSITIVE Sensitive   ?  CEFAZOLIN <=4 SENSITIVE Sensitive   ?  CEFEPIME <=0.12 SENSITIVE Sensitive   ?  CEFTRIAXONE <=0.25 SENSITIVE Sensitive   ?  CIPROFLOXACIN <=0.25 SENSITIVE Sensitive   ?  GENTAMICIN <=1 SENSITIVE Sensitive   ?  IMIPENEM 4 SENSITIVE Sensitive   ?  NITROFURANTOIN 128 RESISTANT Resistant   ?  TRIMETH/SULFA <=20 SENSITIVE Sensitive   ?   AMPICILLIN/SULBACTAM <=2 SENSITIVE Sensitive   ?  PIP/TAZO <=4 SENSITIVE Sensitive   ?  * >=100,000 COLONIES/mL PROTEUS MIRABILIS  ? ? ?'[x]'$  Patient discharged originally without antimicrobial agent and treatment may now be indicated ? ?New antibiotic prescription: Flow Manager to call patient. If still experiencing urinary symptoms, start ciprofloxacin '500mg'$  PO q12h x 5 days. If no further urinary symptoms, no further treatment indicated with urinary catheter now changed out. ? ?ED Provider: Quentin Mulling, PA-C ? ? ?Margretta Sidle Dohlen ?04/01/2022, 8:32 AM ?Clinical Pharmacist ?Monday - Friday phone -  [phone] ?Saturday - Sunday phone - [phone] ? ?

## 2022-04-03 ENCOUNTER — Ambulatory Visit (INDEPENDENT_AMBULATORY_CARE_PROVIDER_SITE_OTHER): Payer: No Typology Code available for payment source | Admitting: Physician Assistant

## 2022-04-03 DIAGNOSIS — R339 Retention of urine, unspecified: Secondary | ICD-10-CM

## 2022-04-03 NOTE — Progress Notes (Signed)
Suprapubic Cath Change ? ?Patient is present today for a suprapubic catheter change due to urinary retention.  68m of water was drained from the balloon, a 18FR foley cath was removed from the tract with out difficulty.  Site was cleaned and prepped in a sterile fashion with betadine.  A 20FR foley cath was replaced into the tract no complications were noted. Urine return was noted, 10 ml of sterile water was inflated into the balloon and a bed bag was attached for drainage.  Patient tolerated well. A night bag was given to patient and proper instruction was given on how to switch bags. ?Flushed with 548mof sterile water with [place].    ? ?Performed by: KouLevi AlandMA ? ?Follow up: Follow up with MD  ?

## 2022-04-10 ENCOUNTER — Encounter: Payer: Self-pay | Admitting: Urology

## 2022-04-10 ENCOUNTER — Ambulatory Visit (INDEPENDENT_AMBULATORY_CARE_PROVIDER_SITE_OTHER): Payer: No Typology Code available for payment source | Admitting: Urology

## 2022-04-10 VITALS — BP 111/79 | HR 91

## 2022-04-10 DIAGNOSIS — Z435 Encounter for attention to cystostomy: Secondary | ICD-10-CM | POA: Diagnosis not present

## 2022-04-10 DIAGNOSIS — R339 Retention of urine, unspecified: Secondary | ICD-10-CM

## 2022-04-10 DIAGNOSIS — N3020 Other chronic cystitis without hematuria: Secondary | ICD-10-CM | POA: Diagnosis not present

## 2022-04-10 NOTE — Progress Notes (Signed)
? ?Assessment: ?1. Urinary retention   ?2. Attention to cystostomy [facility])   ?3. Chronic cystitis   ? ? ? ?04/10/22: Cory Pacheco returns today in f/u for cystoscopy via his SP tract for evaluation of recurrent catheter obstruction.  He has actually been doing better following upsizing to 1f and treatment of the infection.  ?No chief complaint on file. ? ? ?HPI: ?Cory WINERis a 76y.o. male with history of bladder stones and urinary retention who presents for evaluation of issues with his SP tube.  He is due for SP tube change next week.  Patient is unable to give history due to current medical conditions.  The patient's wife reports 2 days ago there was a decrease in output and home health nursing was unable to perform installation or obtain output from the catheter today.  The patient has had significant incontinence from the urethra since the SP tube stopped functioning.  The patient's wife states he has had no fever, vomiting, other symptoms of illness.  He denies pain.  He has had some gross hematuria.  His wife also states he was treated for a UTI by Dr. DQuillian Quincea few months ago with good resolution of patient's symptoms of cystitis and gross hematuria. ?UA = greater than 30 WBCs, greater than 30 RBCs, few bacteria, nitrite positive ? ?Portions of the above documentation were copied from a prior visit for review purposes only. ? ?Allergies: ?Allergies  ?Allergen Reactions  ? Penicillins Hives  ? Bupropion Other (See Comments)  ?  Patient not sure  ? Metformin Other (See Comments)  ?  Patient not sure  ? Sulfa Antibiotics Other (See Comments)  ?  Patient not sure  ? ? ?PMH: ?Past Medical History:  ?Diagnosis Date  ? Anxiety   ? Arthritis   ? Depression   ? Hypertension   ? [place]   ? ? ?PSH: ?Past Surgical History:  ?Procedure Laterality Date  ? CHOLECYSTECTOMY    ? COLONOSCOPY    ? COLONOSCOPY N/A 08/03/2013  ? Procedure: COLONOSCOPY;  Surgeon: NRogene Houston MD;  [facility];  Service:  Endoscopy;  Laterality: N/A;  930  ? TONSILLECTOMY    ? ? ?SH: ?Social History  ? ?Tobacco Use  ? Smoking status: Never  ? Smokeless tobacco: Never  ?Vaping Use  ? Vaping Use: Never used  ?Substance Use Topics  ? Alcohol use: Never  ? Drug use: Never  ? ? ?ROS: ? ? ?PE: ?GENERAL APPEARANCE: Chronically ill-appearing, in wheelchair.  G-tube intact.  SP tube removed and reinserted easily. ? ? ?Results: ?Laboratory Data: ?Lab Results  ?Component Value Date  ? WBC 8.6 01/01/2022  ? HGB 14.6 01/01/2022  ? HCT 43.6 01/01/2022  ? MCV 102.1 (H) 01/01/2022  ? PLT 162 01/01/2022  ? ? ?Lab Results  ?Component Value Date  ? CREATININE 0.74 01/01/2022  ? ? ?Urinalysis ?   ?Component Value Date/Time  ? [place](A) 05/28/2021 1631  ? APPEARANCEUR Clear 03/20/2022 1149  ? LABSPEC 1.008 05/28/2021 1631  ? PHURINE 7.0 05/28/2021 1631  ? GLUCOSEU Negative 03/20/2022 1149  ? HGBUR MODERATE (A) 05/28/2021 1631  ? BILIRUBINUR Negative 03/20/2022 1149  ? [place]IVE 05/28/2021 1631  ? PROTEINUR Trace (A) 03/20/2022 1149  ? PROTEINUR 100 (A) 05/28/2021 1631  ? NITRITE Positive (A) 03/20/2022 1149  ? NITRITE NEGATIVE 05/28/2021 1631  ? LEUKOCYTESUR 3+ (A) 03/20/2022 1149  ? LEUKOCYTESUR MODERATE (A) 05/28/2021 1631  ? ? ?Lab Results  ?  Component Value Date  ? LABMICR See below: 03/20/2022  ? WBCUA >30 (A) 03/20/2022  ? LABEPIT 0-10 03/20/2022  ? BACTERIA Few (A) 03/20/2022  ? ? ?Pertinent Imaging: ? ? ?Procedure: Flexible cystoscopy. ? ?The SP tube was removed and the site was prepped with betadine.   The scope was lubricated and passed via the tract without difficulty into the bladder.  There is chronic inflammation without significant edema or lesions suspicious for neoplasm.  No stones are noted.  The UO's are unremarkable.  The bladder neck is normal.   After the scope was removed, a fresh 52f foley was placed via the tract and the balloon was filled with 188mof fluid.   A bedside bag was attached.  ? ? ?A/P He will  continue monthly tube changes and then f/u in 10m14mor an OV.  ?

## 2022-04-10 NOTE — Progress Notes (Signed)
Suprapubic Cath Change ? ?Patient is present today for a suprapubic catheter change due to urinary retention.  17m of water was drained from the balloon, a 20FR foley cath was removed from the tract with out difficulty.  Site was cleaned and prepped in a sterile fashion with betadine.  A 20FR foley cath was replaced into the tract no complications were noted. Urine return was noted, 10 ml of sterile water was inflated into the balloon and a bedside bag was attached for drainage.  Patient tolerated well.   ? ?Performed by: Audy Dauphine LPN ? ?Follow up: 1 month change ?

## 2022-04-15 ENCOUNTER — Ambulatory Visit: Payer: No Typology Code available for payment source | Admitting: Urology

## 2022-04-18 ENCOUNTER — Ambulatory Visit (INDEPENDENT_AMBULATORY_CARE_PROVIDER_SITE_OTHER): Payer: No Typology Code available for payment source | Admitting: Physician Assistant

## 2022-04-18 ENCOUNTER — Other Ambulatory Visit: Payer: Self-pay

## 2022-04-18 ENCOUNTER — Ambulatory Visit: Payer: No Typology Code available for payment source | Admitting: Physician Assistant

## 2022-04-18 VITALS — BP 104/61 | HR 72 | Ht 71.0 in | Wt 247.0 lb

## 2022-04-18 DIAGNOSIS — R339 Retention of urine, unspecified: Secondary | ICD-10-CM

## 2022-04-18 DIAGNOSIS — N3020 Other chronic cystitis without hematuria: Secondary | ICD-10-CM

## 2022-04-18 MED ORDER — ACETIC ACID 0.25 % IR SOLN: Freq: Two times a day (BID) | 12 refills | Status: DC

## 2022-04-18 NOTE — Progress Notes (Signed)
Suprapubic Cath Change  Patient is present today for a suprapubic catheter change due to urinary retention.  10ml of water was drained from the balloon, a 20FR foley cath was removed from the tract with out difficulty.  Site was cleaned and prepped in a sterile fashion with betadine.  A 20FR foley cath was replaced into the tract no complications were noted. Urine return was noted, 10 ml of sterile water was inflated into the balloon and a bed bag was attached for drainage.  Patient tolerated well. A night bag was given to patient and proper instruction was given on how to switch bags.    Performed by: Furious Chiarelli, CMA  Follow up: Follow up as scheduled.   

## 2022-04-23 ENCOUNTER — Telehealth: Payer: Self-pay

## 2022-04-23 ENCOUNTER — Other Ambulatory Visit: Payer: Self-pay

## 2022-04-23 DIAGNOSIS — N3020 Other chronic cystitis without hematuria: Secondary | ICD-10-CM

## 2022-04-23 MED ORDER — ACETIC ACID 0.25 % IR SOLN: Freq: Two times a day (BID) | 12 refills | Status: AC

## 2022-04-23 NOTE — Telephone Encounter (Signed)
Patient's wife called to ask that the Rx -  ?acetic acid 0.25 % irrigation   ? ?Be faxed to [facility] -  ?Fax [phone] (AttButch Penny) ? ?Please include a description of why pt will need the RX. ? ?Thanks, ?Helene Kelp ? ? ?

## 2022-04-23 NOTE — Telephone Encounter (Signed)
Order faxed.

## 2022-04-28 ENCOUNTER — Ambulatory Visit (INDEPENDENT_AMBULATORY_CARE_PROVIDER_SITE_OTHER): Payer: No Typology Code available for payment source | Admitting: Physician Assistant

## 2022-04-28 VITALS — BP 105/70 | HR 69 | Ht 71.0 in | Wt 247.0 lb

## 2022-04-28 DIAGNOSIS — R339 Retention of urine, unspecified: Secondary | ICD-10-CM

## 2022-04-28 DIAGNOSIS — N481 Balanitis: Secondary | ICD-10-CM

## 2022-04-28 DIAGNOSIS — T83090D Other mechanical complication of cystostomy catheter, subsequent encounter: Secondary | ICD-10-CM

## 2022-04-28 DIAGNOSIS — T83090A Other mechanical complication of cystostomy catheter, initial encounter: Secondary | ICD-10-CM | POA: Diagnosis not present

## 2022-04-28 DIAGNOSIS — N3020 Other chronic cystitis without hematuria: Secondary | ICD-10-CM | POA: Diagnosis not present

## 2022-04-28 LAB — BLADDER SCAN AMB NON-IMAGING: Scan Result: 13

## 2022-04-28 MED ORDER — CLOTRIMAZOLE 1 % EX CREA: 1.0000 "application " | TOPICAL_CREAM | Freq: Two times a day (BID) | CUTANEOUS | 0 refills | Status: AC

## 2022-04-28 NOTE — Progress Notes (Signed)
post void residual=60m ? ?Suprapubic Cath Change ? ?Patient is present today for a suprapubic catheter change due to urinary retention.  126mof water was drained from the balloon, a 20FR foley cath was removed from the tract with out difficulty.  Site was cleaned and prepped in a sterile fashion with betadine.  A 22FR foley cath was replaced into the tract no complications were noted. Catheter was flushed with sterile water with return. 10 ml of sterile water was inflated into the balloon and a bed bag was attached for drainage.  Patient tolerated well. A night bag was given to patient and proper instruction was given on how to switch bags.   ? ?Performed by: KoLevi AlandCMA ? ?Follow up: Follow up as scheduled.  ? ?

## 2022-04-28 NOTE — Patient Instructions (Signed)
Continue flushes twice daily ? ?

## 2022-04-28 NOTE — Progress Notes (Addendum)
Assessment: 1. Urinary retention - BLADDER SCAN AMB NON-IMAGING - Basic metabolic panel  2. Chronic cystitis - Urinalysis, Routine w reflex microscopic; Future  3. Obstruction of suprapubic catheter, subsequent encounter - Urinalysis, Routine w reflex microscopic; Future  4. Balanitis    Plan: 4F SP tube placed today. Unable to get enough urine for UA BMP to ensure renal fxn okay in light of poor output measurement. Clotrimazole Rx for balanitis and ostomy care discussed. Pt's wife will continue to flush with acetic acid BID as previously directed. FU in 2-3 days for flush, wound check if she has concerns. Strict ED precautions discussed.  Chief Complaint: SP tube clogged again  HPI: Cory Pacheco is a 76 y.o. male who presents for continued evaluation of issues with his SP tube. Hx is provided by his very attentive wife.  Since the patient's last visit for obstruction of the SP tube 10 days ago, she has been flushing twice daily with acetic acid.  Since the last 20 French catheter change 10 days ago, the patient has had a good amount of output until dramatic decrease 2 days ago.  Patient's wife has tried to flush the catheter without success since that time.  She notes that he has had urine leakage from around the SP tube itself as well as from his urethra. She is having to change maxi pads because of the amount of output through the urethra.  Gravity bag intact today with approximately 50 mL of dark brown urine in the bag.  He has had no change in mental status, no fever, vomiting no new complaints of pain. Bladder scan=65m  04/10/22 JBlaizereturns today in f/u for cystoscopy via his SP tract for evaluation of recurrent catheter obstruction.  He has actually been doing better following upsizing to 252fand treatment of the infection.  No chief complaint on file.     HPI: Cory VANBLARCOMs a [age].o. male with history of bladder stones and urinary retention who presents for  evaluation of issues with his SP tube.  He is due for SP tube change next week.  Patient is unable to give history due to current medical conditions.  The patient's wife reports 2 days ago there was a decrease in output and home health nursing was unable to perform installation or obtain output from the catheter today.  The patient has had significant incontinence from the urethra since the SP tube stopped functioning.  The patient's wife states he has had no fever, vomiting, other symptoms of illness.  He denies pain.  He has had some gross hematuria.  His wife also states he was treated for a UTI by Dr. DaQuillian Quince few months ago with good resolution of patient's symptoms of cystitis and gross hematuria. UA = greater than 30 WBCs, greater than 30 RBCs, few bacteria, nitrite positive Portions of the above documentation were copied from a prior visit for review purposes only.  Allergies: Allergies  Allergen Reactions   Penicillins Hives   Bupropion Other (See Comments)    Patient not sure   Metformin Other (See Comments)    Patient not sure   Sulfa Antibiotics Other (See Comments)    Patient not sure    PMH: Past Medical History:  Diagnosis Date   Anxiety    Arthritis    Depression    Hypertension    [place]    PSH: Past Surgical History:  Procedure Laterality Date   CHOLECYSTECTOMY  COLONOSCOPY     COLONOSCOPY N/A 08/03/2013   Procedure: COLONOSCOPY;  Surgeon: Rogene Houston, MD;  [facility];  Service: Endoscopy;  Laterality: N/A;  61   TONSILLECTOMY      SH: Social History   Tobacco Use   Smoking status: Never   Smokeless tobacco: Never  Vaping Use   Vaping Use: Never used  Substance Use Topics   Alcohol use: Never   Drug use: Never    ROS: See HPI  PE: BP 105/70   Pulse 69   Ht '5\' 11"'$  (1.803 m)   Wt 247 lb (112 kg)   BMI 34.45 kg/m  GENERAL APPEARANCE:  Well developed, appears not feeling well today HEENT:  Atraumatic,  normocephalic NECK:  Supple. Trachea midline ABDOMEN:  Soft, no masses, PEG tube noted. SP ostomy site friable with moderate serous exudate. No surrounding erythema, induration. No tenderness or active bleeding. Circumference of ostomy tx with Silver Nitrate. GU: Patient's glans and urethral meatus are moderately erythematous with soupy exudate. EXTREMITIES: Moderate LE edema noted NEUROLOGIC:  Alert. In permanent reclining wheelchair   Results: Laboratory Data: Lab Results  Component Value Date   WBC 8.6 01/01/2022   HGB 14.6 01/01/2022   HCT 43.6 01/01/2022   MCV 102.1 (H) 01/01/2022   PLT 162 01/01/2022    Lab Results  Component Value Date   CREATININE 0.74 01/01/2022    Urinalysis    Component Value Date/Time   COLORURINE AMBER (A) 05/28/2021 1631   APPEARANCEUR Clear 03/20/2022 1149   LABSPEC 1.008 05/28/2021 1631   PHURINE 7.0 05/28/2021 1631   GLUCOSEU Negative 03/20/2022 1149   HGBUR MODERATE (A) 05/28/2021 1631   BILIRUBINUR Negative 03/20/2022 [place] 05/28/2021 1631   PROTEINUR Trace (A) 03/20/2022 1149   PROTEINUR 100 (A) 05/28/2021 1631   NITRITE Positive (A) 03/20/2022 1149   NITRITE NEGATIVE 05/28/2021 1631   LEUKOCYTESUR 3+ (A) 03/20/2022 1149   LEUKOCYTESUR MODERATE (A) 05/28/2021 1631    Lab Results  Component Value Date   LABMICR See below: 03/20/2022   WBCUA >30 (A) 03/20/2022   LABEPIT 0-10 03/20/2022   BACTERIA Few (A) 03/20/2022    Pertinent Imaging: No results found for this or any previous visit.    No results found for this or any previous visit (from the past 24 hour(s)).

## 2022-04-29 LAB — BASIC METABOLIC PANEL
BUN/Creatinine Ratio: 29 — ABNORMAL HIGH (ref 10–24)
BUN: 25 mg/dL (ref 8–27)
CO2: 25 mmol/L (ref 20–29)
Calcium: 9.6 mg/dL (ref 8.6–10.2)
Chloride: 97 mmol/L (ref 96–106)
Creatinine, Ser: 0.85 mg/dL (ref 0.76–1.27)
Glucose: 108 mg/dL — ABNORMAL HIGH (ref 70–99)
Potassium: 3.9 mmol/L (ref 3.5–5.2)
Sodium: 137 mmol/L (ref 134–144)
eGFR: 90 mL/min/{1.73_m2} (ref >59)

## 2022-05-01 ENCOUNTER — Encounter: Payer: Self-pay | Admitting: Physician Assistant

## 2022-05-01 ENCOUNTER — Ambulatory Visit (INDEPENDENT_AMBULATORY_CARE_PROVIDER_SITE_OTHER): Payer: No Typology Code available for payment source | Admitting: Physician Assistant

## 2022-05-01 VITALS — BP 122/71 | HR 58

## 2022-05-01 DIAGNOSIS — N3020 Other chronic cystitis without hematuria: Secondary | ICD-10-CM | POA: Diagnosis not present

## 2022-05-01 DIAGNOSIS — N481 Balanitis: Secondary | ICD-10-CM

## 2022-05-01 DIAGNOSIS — T83090D Other mechanical complication of cystostomy catheter, subsequent encounter: Secondary | ICD-10-CM

## 2022-05-01 DIAGNOSIS — R339 Retention of urine, unspecified: Secondary | ICD-10-CM | POA: Diagnosis not present

## 2022-05-01 DIAGNOSIS — T83090A Other mechanical complication of cystostomy catheter, initial encounter: Secondary | ICD-10-CM

## 2022-05-01 DIAGNOSIS — Z7189 Other specified counseling: Secondary | ICD-10-CM | POA: Diagnosis not present

## 2022-05-01 NOTE — Progress Notes (Signed)
Flushed catheter with 105m of sterile water with clear return.  Patient to keep next scheduled apt.   KLevi Aland CCleveland

## 2022-05-01 NOTE — Progress Notes (Signed)
Assessment: 1. Urinary retention  2. Chronic cystitis  3. Obstruction of suprapubic catheter, subsequent encounter  4. Encounter for ostomy care education  5. Balanitis    Plan: Cath is flushed and noted to be patent. His wife will continue the excellent wound care of ostomy site as previously discussed and will flush catheter twice daily with acetic acid flushes.  Continue clotrimazole cream as needed if symptoms of balanitis recur.  He has FU scheduled on 6/20 for SP tube change.   Chief Complaint: SP tube check  HPI: Cory Pacheco is a 76 y.o. male who presents for continued evaluation of SP tube and ostomy wound, balanitis check.  Ostomy wound treated with silver nitrate at his visit earlier this week and SP tubing up-sized to 85F.  His wife states that ostomy site has remained dry and appears to be healing nicely.  Erythema and moisture around the distal penis has also improved.  She reports his catheter has been draining well and is easy to flush. He has had no issues with obstruction since change cath. He continues to have sediment, but she has noticed his urine is more clear than it has been in quite some time.  Patient is actually feeling much better this morning and is quite jovial.  No gross hematuria.  He has had no fever, pain.  BMP ordered earlier this week indicates no signs of kidney injury.  [facility]  04/28/22 Cory Pacheco is a 76 y.o. male who presents for continued evaluation of issues with his SP tube. Hx is provided by his very attentive wife.  Since the patient's last visit for obstruction of the SP tube 10 days ago, she has been flushing twice daily with acetic acid.  Since the last 20 French catheter change 10 days ago, the patient has had a good amount of output until dramatic decrease 2 days ago.  Patient's wife has tried to flush the catheter without success since that time.  She notes that he has had  urine leakage from around the SP tube itself as well as from his urethra. She is having to change maxi pads because of the amount of output through the urethra.  Gravity bag intact today with approximately 50 mL of dark brown urine in the bag.  He has had no change in mental status, no fever, vomiting no new complaints of pain. Bladder scan=63m   04/10/22 JTourereturns today in f/u for cystoscopy via his SP tract for evaluation of recurrent catheter obstruction.  He has actually been doing better following upsizing to 274fand treatment of the infection.  No chief complaint on file.   Cory Pacheco a [age].o. male with history of bladder stones and urinary retention who presents for evaluation of issues with his SP tube.  He is due for SP tube change next week.  Patient is unable to give history due to current medical conditions.  The patient's wife reports 2 days ago there was a decrease in output and home health nursing was unable to perform installation or obtain output from the catheter today.  The patient has had significant incontinence from the urethra since the SP tube stopped functioning.  The patient's wife states he has had no fever, vomiting, other symptoms of illness.  He denies pain.  He has had some gross hematuria.  His wife also states he was treated for a UTI by Dr. DaQuillian Quince  a few months ago with good resolution of patient's symptoms of cystitis and gross hematuria. UA = greater than 30 WBCs, greater than 30 RBCs, few bacteria, nitrite positive   Portions of the above documentation were copied from a prior visit for review purposes only.  Allergies: Allergies  Allergen Reactions   Penicillins Hives   Bupropion Other (See Comments)    Patient not sure   Metformin Other (See Comments)    Patient not sure   Sulfa Antibiotics Other (See Comments)    Patient not sure    PMH: Past Medical History:  Diagnosis Date   Anxiety    Arthritis    Depression    Hypertension     Seizure ([place])     PSH: Past Surgical History:  Procedure Laterality Date   CHOLECYSTECTOMY     COLONOSCOPY     COLONOSCOPY N/A 08/03/2013   Procedure: COLONOSCOPY;  Surgeon: Rogene Houston, MD;  [facility];  Service: Endoscopy;  Laterality: N/A;  9   TONSILLECTOMY      SH: Social History   Tobacco Use   Smoking status: Never   Smokeless tobacco: Never  Vaping Use   Vaping Use: Never used  Substance Use Topics   Alcohol use: Never   Drug use: Never    ROS: See HPI  PE: BP 122/71   Pulse (!) 58  GENERAL APPEARANCE:  Well nourished, well developed, smiling and interactive today HEENT:  Atraumatic, normocephalic NECK:  Supple. Trachea midline ABDOMEN:  Soft, non-tender, no masses. PEG tube and dressing noted.  SP tube ostomy site healing nicely granulation and no active drainage or moisture.  No tenderness noted EXTREMITIES: Usual edema noted bilateral lower extremities NEUROLOGIC:  Alert and interactive.  Wheelchair mobilization MENTAL STATUS:  appropriate SKIN:  Warm, dry, and intact  Results: Laboratory Data: Lab Results  Component Value Date   WBC 8.6 01/01/2022   HGB 14.6 01/01/2022   HCT 43.6 01/01/2022   MCV 102.1 (H) 01/01/2022   PLT 162 01/01/2022    Lab Results  Component Value Date   CREATININE 0.85 04/28/2022     Urinalysis    Component Value Date/Time   COLORURINE AMBER (A) 05/28/2021 1631   APPEARANCEUR Clear 03/20/2022 1149   LABSPEC 1.008 05/28/2021 1631   PHURINE 7.0 05/28/2021 1631   GLUCOSEU Negative 03/20/2022 1149   HGBUR MODERATE (A) 05/28/2021 1631   BILIRUBINUR Negative 03/20/2022 [place] 05/28/2021 1631   PROTEINUR Trace (A) 03/20/2022 1149   PROTEINUR 100 (A) 05/28/2021 1631   NITRITE Positive (A) 03/20/2022 1149   NITRITE NEGATIVE 05/28/2021 1631   LEUKOCYTESUR 3+ (A) 03/20/2022 1149   LEUKOCYTESUR MODERATE (A) 05/28/2021 1631    Lab Results  Component Value Date   LABMICR See below:  03/20/2022   WBCUA >30 (A) 03/20/2022   LABEPIT 0-10 03/20/2022   BACTERIA Few (A) 03/20/2022    Pertinent Imaging: No results found for this or any previous visit.   No results found for this or any previous visit (from the past 24 hour(s)).

## 2022-05-06 ENCOUNTER — Ambulatory Visit: Payer: No Typology Code available for payment source | Admitting: Physician Assistant

## 2022-05-08 ENCOUNTER — Ambulatory Visit: Payer: No Typology Code available for payment source | Admitting: Physician Assistant

## 2022-05-08 DIAGNOSIS — I509 Heart failure, unspecified: Secondary | ICD-10-CM | POA: Diagnosis not present

## 2022-05-08 DIAGNOSIS — I480 Paroxysmal atrial fibrillation: Secondary | ICD-10-CM | POA: Diagnosis not present

## 2022-05-08 DIAGNOSIS — Z741 Need for assistance with personal care: Secondary | ICD-10-CM | POA: Diagnosis not present

## 2022-05-08 DIAGNOSIS — R5381 Other malaise: Secondary | ICD-10-CM | POA: Diagnosis not present

## 2022-05-08 DIAGNOSIS — Z7401 Bed confinement status: Secondary | ICD-10-CM | POA: Diagnosis not present

## 2022-05-08 DIAGNOSIS — Z515 Encounter for palliative care: Secondary | ICD-10-CM | POA: Diagnosis not present

## 2022-05-08 DIAGNOSIS — G20 Parkinson's disease: Secondary | ICD-10-CM | POA: Diagnosis not present

## 2022-05-08 DIAGNOSIS — Z8619 Personal history of other infectious and parasitic diseases: Secondary | ICD-10-CM | POA: Diagnosis not present

## 2022-05-08 DIAGNOSIS — M6240 Contracture of muscle, unspecified site: Secondary | ICD-10-CM | POA: Diagnosis not present

## 2022-05-08 DIAGNOSIS — N3020 Other chronic cystitis without hematuria: Secondary | ICD-10-CM | POA: Diagnosis not present

## 2022-05-08 DIAGNOSIS — R532 Functional quadriplegia: Secondary | ICD-10-CM | POA: Diagnosis not present

## 2022-05-08 DIAGNOSIS — Z7901 Long term (current) use of anticoagulants: Secondary | ICD-10-CM | POA: Diagnosis not present

## 2022-05-13 ENCOUNTER — Ambulatory Visit (INDEPENDENT_AMBULATORY_CARE_PROVIDER_SITE_OTHER): Payer: No Typology Code available for payment source | Admitting: Urology

## 2022-05-13 ENCOUNTER — Encounter: Payer: Self-pay | Admitting: Urology

## 2022-05-13 DIAGNOSIS — Z435 Encounter for attention to cystostomy: Secondary | ICD-10-CM

## 2022-05-13 DIAGNOSIS — R339 Retention of urine, unspecified: Secondary | ICD-10-CM | POA: Diagnosis not present

## 2022-05-13 DIAGNOSIS — T83090D Other mechanical complication of cystostomy catheter, subsequent encounter: Secondary | ICD-10-CM

## 2022-05-13 NOTE — Progress Notes (Deleted)
   Assessment: 1. Obstruction of suprapubic catheter, subsequent encounter   2. Urinary retention     Plan: ***  Chief Complaint: No chief complaint on file.   HPI: Cory Pacheco is a 76 y.o. male who presents for continued evaluation of problems with suprapubic catheter.  He has a chronic SP tube and has had issues with obstruction of the suprapubic catheter secondary to sediment.  He is followed by Dr. Jeffie Pollock.  He was last seen on 05/01/2022 by Roney Marion, PA-C.  The SP tube was flushed and noted to be patent.  He is due for an SP tube change on 06/03/2022.   Portions of the above documentation were copied from a prior visit for review purposes only.  Allergies: Allergies  Allergen Reactions   Penicillins Hives   Bupropion Other (See Comments)    Patient not sure   Metformin Other (See Comments)    Patient not sure   Sulfa Antibiotics Other (See Comments)    Patient not sure    PMH: Past Medical History:  Diagnosis Date   Anxiety    Arthritis    Depression    Hypertension    Seizure ([place])     PSH: Past Surgical History:  Procedure Laterality Date   CHOLECYSTECTOMY     COLONOSCOPY     COLONOSCOPY N/A 08/03/2013   Procedure: COLONOSCOPY;  Surgeon: Rogene Houston, MD;  [facility];  Service: Endoscopy;  Laterality: N/A;  68   TONSILLECTOMY      SH: Social History   Tobacco Use   Smoking status: Never   Smokeless tobacco: Never  Vaping Use   Vaping Use: Never used  Substance Use Topics   Alcohol use: Never   Drug use: Never    ROS: Constitutional:  Negative for fever, chills, weight loss CV: Negative for chest pain, previous MI, hypertension Respiratory:  Negative for shortness of breath, wheezing, sleep apnea, frequent cough GI:  Negative for nausea, vomiting, bloody stool, GERD  PE: There were no vitals taken for this visit. GENERAL APPEARANCE:  Well appearing, well developed, well nourished, NAD HEENT:  Atraumatic,  normocephalic, oropharynx clear NECK:  Supple without lymphadenopathy or thyromegaly ABDOMEN:  Soft, non-tender, no masses EXTREMITIES:  Moves all extremities well, without clubbing, cyanosis, or edema NEUROLOGIC:  Alert and oriented x 3, normal gait, CN II-XII grossly intact MENTAL STATUS:  appropriate BACK:  Non-tender to palpation, No CVAT SKIN:  Warm, dry, and intact   Results: No results found for this or any previous visit (from the past 24 hour(s)).

## 2022-05-13 NOTE — Progress Notes (Signed)
Suprapubic Cath Change  Patient is present today for a suprapubic catheter change due to urinary retention.  76m of water was drained from the balloon, a 22FR foley cath was removed from the tract with out difficulty.  Site was cleaned and prepped in a sterile fashion with betadine.  A 242VZsilicone foley cath was replaced into the tract no complications were noted. Urine return was noted, 10 ml of sterile water was inflated into the balloon and a bed bag was attached for drainage.  Patient tolerated well. A night bag was given to patient and proper instruction was given on how to switch bags.    Performed by: KLevi Aland CMA  Follow up: Follow up as scheduled.    I agree with the plan as noted above.

## 2022-05-15 ENCOUNTER — Other Ambulatory Visit: Payer: No Typology Code available for payment source | Admitting: Physician Assistant

## 2022-05-15 ENCOUNTER — Telehealth: Payer: Self-pay

## 2022-05-15 DIAGNOSIS — R8271 Bacteriuria: Secondary | ICD-10-CM

## 2022-05-15 LAB — URINALYSIS, ROUTINE W REFLEX MICROSCOPIC
Bilirubin, UA: NEGATIVE
Glucose, UA: NEGATIVE
Nitrite, UA: POSITIVE — AB
Specific Gravity, UA: 1.02 (ref 1.005–1.030)
Urobilinogen, Ur: 2 mg/dL — ABNORMAL HIGH (ref 0.2–1.0)
pH, UA: 7 (ref 5.0–7.5)

## 2022-05-15 LAB — MICROSCOPIC EXAMINATION
RBC, Urine: >30 /hpf — AB (ref 0–2)
Renal Epithel, UA: NONE SEEN /hpf
WBC, UA: >30 /hpf — AB (ref 0–5)

## 2022-05-15 NOTE — Telephone Encounter (Signed)
Pt's wife is having a urine specimen dropped off.Marland Kitchen  He is not feeling well and has a temp.   Thanks, Helene Kelp

## 2022-05-15 NOTE — Progress Notes (Signed)
Patient with history of indwelling suprapubic catheter. Urine dropped off per pt's wife with a note that he has temp 98.5 and not feeling well. She requested UA. Attempted to contact pt/wife to discuss. No answer.  UA= greater than 30 WBCs, greater than 30 RBCs, many bacteria, nitrite positive Urine culture ordered

## 2022-05-16 ENCOUNTER — Telehealth: Payer: Self-pay | Admitting: Physician Assistant

## 2022-05-16 NOTE — Telephone Encounter (Signed)
The patient's wife had someone to drop urine off yesterday for evaluation because she states that patient had not been feeling well during the day and she felt he was feverish with a temp of 98.5.  Urinalysis reviewed and sent for culture.  WBCs greater than 30, RBCs greater than 30, many bacteria.  Patient's wife states that he is feeling better today and there are no issues with the catheter.  If he develops symptoms or concerns over the weekend, she will take him to the emergency department.  We will call her next week with culture results plan for treatment if indicated.  Note that SP tube was changed on 05/13/2022

## 2022-05-20 LAB — URINE CULTURE

## 2022-05-21 ENCOUNTER — Telehealth: Payer: Self-pay

## 2022-05-21 NOTE — Telephone Encounter (Signed)
-----   Message from Reynaldo Minium, [place] sent at 05/20/2022  4:35 PM EDT ----- Urine cx has resulted. Please ask pt's wife how pt is doing. His culture did grow bacteria seen in SP tube pts. If he is feeling okay, we should avoid antibx in order to prevent resistance. ----- Message ----- From: Lavone Neri Lab Results In Sent: 05/15/2022   5:45 PM EDT To: Reynaldo Minium, PA-C

## 2022-05-21 NOTE — Telephone Encounter (Signed)
Wife states that the patient is doing great, he has some sediment in the bag but she is able to flush easily.

## 2022-06-03 ENCOUNTER — Ambulatory Visit: Payer: No Typology Code available for payment source | Admitting: Physician Assistant

## 2022-06-06 ENCOUNTER — Other Ambulatory Visit (HOSPITAL_COMMUNITY): Payer: Self-pay | Admitting: Specialist

## 2022-06-06 DIAGNOSIS — T17998S Other foreign object in respiratory tract, part unspecified causing other injury, sequela: Secondary | ICD-10-CM

## 2022-06-06 DIAGNOSIS — R1312 Dysphagia, oropharyngeal phase: Secondary | ICD-10-CM

## 2022-06-09 ENCOUNTER — Ambulatory Visit (INDEPENDENT_AMBULATORY_CARE_PROVIDER_SITE_OTHER): Payer: No Typology Code available for payment source | Admitting: Urology

## 2022-06-09 ENCOUNTER — Ambulatory Visit: Payer: No Typology Code available for payment source | Admitting: Physician Assistant

## 2022-06-09 DIAGNOSIS — R338 Other retention of urine: Secondary | ICD-10-CM | POA: Diagnosis not present

## 2022-06-09 DIAGNOSIS — R339 Retention of urine, unspecified: Secondary | ICD-10-CM

## 2022-06-09 MED ORDER — DOXYCYCLINE HYCLATE 100 MG PO CAPS: 100.0000 mg | ORAL_CAPSULE | Freq: Two times a day (BID) | ORAL | 0 refills | Status: DC

## 2022-06-09 NOTE — Progress Notes (Signed)
Suprapubic Cath Change  Patient is present today for a suprapubic catheter change due to urinary retention.  28m of water was drained from the balloon, a 20FR foley cath was removed from the tract with out difficulty.  Site was cleaned and prepped in a sterile fashion with betadine.  A 20FR foley cath was replaced into the tract complications were noted as: difficultly removing sp cath due balloon not deflating . Urine return was noted, 10 ml of sterile water was inflated into the balloon and a bedside bag was attached for drainage.  Patient tolerated well. A night bag was given to patient and proper instruction was given on how to switch bags.    Performed by: Dr. MAlyson Ingles/ HOld [facility]oult[facility] Surgical Center IncLPN  Follow up: 3 wk nv sp cath change

## 2022-06-24 ENCOUNTER — Ambulatory Visit (HOSPITAL_COMMUNITY): Payer: PPO | Admitting: Speech Pathology

## 2022-06-24 ENCOUNTER — Encounter (HOSPITAL_COMMUNITY): Payer: Self-pay | Admitting: Speech Pathology

## 2022-06-24 ENCOUNTER — Ambulatory Visit (HOSPITAL_COMMUNITY)
Admission: RE | Admit: 2022-06-24 | Discharge: 2022-06-24 | Disposition: A | Discharge status: Home / Self Care | Payer: PPO | Source: Ambulatory Visit | Attending: Family Medicine | Admitting: Family Medicine

## 2022-06-24 DIAGNOSIS — T17998S Other foreign object in respiratory tract, part unspecified causing other injury, sequela: Secondary | ICD-10-CM

## 2022-06-24 DIAGNOSIS — R1312 Dysphagia, oropharyngeal phase: Secondary | ICD-10-CM

## 2022-06-24 DIAGNOSIS — Z515 Encounter for palliative care: Secondary | ICD-10-CM | POA: Diagnosis not present

## 2022-06-24 DIAGNOSIS — R1310 Dysphagia, unspecified: Secondary | ICD-10-CM | POA: Diagnosis not present

## 2022-06-24 DIAGNOSIS — L89151 Pressure ulcer of sacral region, stage 1: Secondary | ICD-10-CM | POA: Diagnosis not present

## 2022-06-24 DIAGNOSIS — Z9359 Other cystostomy status: Secondary | ICD-10-CM | POA: Diagnosis not present

## 2022-06-24 NOTE — Therapy (Signed)
[place], [place], [address] Phone: [phone]   Fax:  [phone]  Modified Barium Swallow  Patient Details  Name: Cory Pacheco MRN: [id number] Date of Birth: 04/28/1946 No data recorded  Encounter Date: 06/24/2022   End of Session - 06/24/22 1655     Visit Number 1    Number of Visits 1    Authorization Type [employer] PPO    SLP Start Time 1320    SLP Stop Time  1347    SLP Time Calculation (min) 27 min    Activity Tolerance Patient tolerated treatment well             Past Medical History:  Diagnosis Date   Anxiety    Arthritis    Depression    Hypertension    Seizure ([place])     Past Surgical History:  Procedure Laterality Date   CHOLECYSTECTOMY     COLONOSCOPY     COLONOSCOPY N/A 08/03/2013   Procedure: COLONOSCOPY;  Surgeon: Rogene Houston, MD;  [facility];  Service: Endoscopy;  Laterality: N/A;  930   TONSILLECTOMY      There were no vitals filed for this visit.   Subjective Assessment - 06/24/22 1642     Subjective "I have thickened liquids."    Special Tests MBSS    Currently in Pain? No/denies                 General - 06/24/22 1643       General Information   HPI Cory Pacheco is a 76 y.o. male with a hx of Parkinson's disease, hypertension, chronic atrial fibrillation, seizures, recurrent UTIs    Follows with cardiology at [facility] in [place], [place]. He is referred for MBSS by Dr. Gar Ponto to see if Pt can be upgraded to thin liquids. Past MBSSs completed 05/29/21 and 08/21/21. Pt has been consuming D3/mech soft and nectar-thick liquids for several months. Pt and wife deny recent URI or PNA. He is in a wheelchair and needs assist for feeding.    Type of Study MBS-Modified Barium Swallow Study    Previous Swallow Assessment 08/21/2021 D3/NTL    Diet Prior to this Study Dysphagia 3 (soft);Thin liquids    Temperature Spikes Noted No     Respiratory Status Room air    History of Recent Intubation No    Behavior/Cognition Alert;Cooperative;Pleasant mood    Oral Cavity Assessment Within Functional Limits    Oral Care Completed by SLP No    Oral Cavity - Dentition Adequate natural dentition    Self-Feeding Abilities Total assist    Patient Positioning Upright in chair    Baseline Vocal Quality Normal    Volitional Cough Strong    Volitional Swallow Able to elicit    Anatomy Within functional limits    Pharyngeal Secretions Not observed secondary MBS                Oral Preparation/Oral Phase - 06/24/22 1646       Oral Preparation/Oral Phase   Oral Phase Within functional limits      Electrical stimulation - Oral Phase   Was Electrical Stimulation Used No              Pharyngeal Phase - 06/24/22 1648       Pharyngeal Phase   Pharyngeal Phase Impaired      Pharyngeal - Nectar   Pharyngeal- Nectar Cup Swallow initiation at vallecula;Reduced  [place], [place], [address] Phone: [phone]   Fax:  [phone]  Modified Barium Swallow  Patient Details  Name: Cory Pacheco MRN: [id number] Date of Birth: 04/28/1946 No data recorded  Encounter Date: 06/24/2022   End of Session - 06/24/22 1655     Visit Number 1    Number of Visits 1    Authorization Type [employer] P[employer]    SLP Start Time 1320    SLP Stop Time  1347    SLP Time Calculation (min) 27 min    Activity Tolerance Patient tolerated treatment well             Past Medical History:  Diagnosis Date   Anxiety    Arthritis    Depression    Hypertension    Seizure ([place])     Past Surgical History:  Procedure Laterality Date   CHOLECYSTECTOMY     COLONOSCOPY     COLONOSCOPY N/A 08/03/2013   Procedure: COLONOSCOPY;  Surgeon: Rogene Houston, MD;  [facility];  Service: Endoscopy;  Laterality: N/A;  930   TONSILLECTOMY      There were no vitals filed for this visit.   Subjective Assessment - 06/24/22 1642     Subjective "I have thickened liquids."    Special Tests MBSS    Currently in Pain? No/denies                 General - 06/24/22 1643       General Information   HPI Cory Pacheco is a 76 y.o. male with a hx of Parkinson's disease, hypertension, chronic atrial fibrillation, seizures, recurrent UTIs    Follows with cardiology at [facility] in [place], [place]. He is referred for MBSS by Dr. Gar Ponto to see if Pt can be upgraded to thin liquids. Past MBSSs completed 05/29/21 and 08/21/21. Pt has been consuming D3/mech soft and nectar-thick liquids for several months. Pt and wife deny recent URI or PNA. He is in a wheelchair and needs assist for feeding.    Type of Study MBS-Modified Barium Swallow Study    Previous Swallow Assessment 08/21/2021 D3/NTL    Diet Prior to this Study Dysphagia 3 (soft);Thin liquids    Temperature Spikes Noted No     Respiratory Status Room air    History of Recent Intubation No    Behavior/Cognition Alert;Cooperative;Pleasant mood    Oral Cavity Assessment Within Functional Limits    Oral Care Completed by SLP No    Oral Cavity - Dentition Adequate natural dentition    Self-Feeding Abilities Total assist    Patient Positioning Upright in chair    Baseline Vocal Quality Normal    Volitional Cough Strong    Volitional Swallow Able to elicit    Anatomy Within functional limits    Pharyngeal Secretions Not observed secondary MBS                Oral Preparation/Oral Phase - 06/24/22 1646       Oral Preparation/Oral Phase   Oral Phase Within functional limits      Electrical stimulation - Oral Phase   Was Electrical Stimulation Used No              Pharyngeal Phase - 06/24/22 1648       Pharyngeal Phase   Pharyngeal Phase Impaired      Pharyngeal - Nectar   Pharyngeal- Nectar Cup Swallow initiation at vallecula;Reduced  Swallow Evaluation Recommendations   SLP Diet Recommendations Dysphagia 3 (mechanical soft);Age appropriate regular;Thin;Nectar    Liquid Administration via Cup    Medication Administration Whole meds with puree    Supervision Full assist for feeding    Compensations Multiple dry swallows after each bite/sip;Effortful swallow;Hard cough after swallow    Postural Changes Seated upright at 90 degrees;Remain upright for at least 30 minutes after feeds/meals              Prognosis - 06/24/22 1654       Prognosis   Prognosis for Safe Diet Advancement Fair    Barriers to Reach Goals Severity of deficits;Cognitive deficits    Barriers/Prognosis Comment Needs cues for cough after thins and repeat swallow periodically      Individuals Consulted   Consulted and Agree with Results and Recommendations Patient;Family member/caregiver    Family Member Consulted wife    Report Sent to  Primary SLP;Referring physician              Problem List Patient Active Problem List   Diagnosis Date Noted   Pressure injury of skin 05/06/2017   Encounter for orogastric (OG) tube placement    Seizure ([place]) 05/05/2017   Status epilepticus [facility])    Acute respiratory failure [facility])    Thank you,  Genene Churn, [place]  Genene Churn, [place] 06/24/2022, 4:57 PM  [place] [address] [place], [place], [address] Phone: [phone]   Fax:  [phone]  Name: Cory Pacheco MRN: [id number] Date of Birth: Jul 11, 1946

## 2022-07-02 ENCOUNTER — Ambulatory Visit (INDEPENDENT_AMBULATORY_CARE_PROVIDER_SITE_OTHER): Payer: No Typology Code available for payment source | Admitting: Urology

## 2022-07-02 DIAGNOSIS — R339 Retention of urine, unspecified: Secondary | ICD-10-CM

## 2022-07-02 NOTE — Progress Notes (Signed)
Suprapubic Cath Change  Patient is present today for a suprapubic catheter change due to urinary retention.  67m of water was drained from the balloon, a 246EVsilicone foley cath was removed from the tract with out difficulty.  Site was cleaned and prepped in a sterile fashion with betadine.  A 203JKsilicone foley cath was replaced into the tract no complications were noted. Urine return was noted, 10 ml of sterile water was inflated into the balloon and a bed bag was attached for drainage.  Patient tolerated well.  Catheter was flushed with 20cc sterile water wil 20cc return.   Performed by: KLevi Aland CMA   Follow up: Follow up in 1 month for cath change.

## 2022-07-07 ENCOUNTER — Ambulatory Visit: Payer: No Typology Code available for payment source | Admitting: Physician Assistant

## 2022-07-28 ENCOUNTER — Ambulatory Visit (INDEPENDENT_AMBULATORY_CARE_PROVIDER_SITE_OTHER): Payer: No Typology Code available for payment source | Admitting: Physician Assistant

## 2022-07-28 VITALS — BP 120/75 | HR 71

## 2022-07-28 DIAGNOSIS — R339 Retention of urine, unspecified: Secondary | ICD-10-CM | POA: Diagnosis not present

## 2022-07-28 DIAGNOSIS — N481 Balanitis: Secondary | ICD-10-CM | POA: Diagnosis not present

## 2022-07-28 NOTE — Progress Notes (Unsigned)
Suprapubic Cath Change  Patient is present today for a suprapubic catheter change due to urinary retention.  13m of water was drained from the balloon, a 271HAsilicone foley cath was removed from the tract with out difficulty.  Site was cleaned and prepped in a sterile fashion with betadine.  A 20 FR silicone foley cath was replaced into the tract no complications were noted. Urine return was noted, 10 ml of sterile water was inflated into the balloon and a bedsided bag was attached for drainage.  Patient tolerated well. A night bag was given to patient and proper instruction was given on how to switch bags.    Performed by: AEstill BambergRN  Follow up: to see PA

## 2022-07-31 NOTE — Progress Notes (Signed)
Assessment: 1. Urinary retention - PR CHANGE OF BLADDER TUBE,SIMPLE  2. Balanitis    Plan: Ostomy care again discussed with the patient and his wife.  Follow-up in 1 month for SP tube change and his wife will let us know if he has any concerns prior to this appointment.  We will recheck the ostomy site at that visit and consider silver nitrate if still prominent with moist granular tissue noted.  Chief Complaint: No chief complaint on file.   HPI: Cory Pacheco is a 76 y.o. male who presents for continued evaluation of urinary retention and SP tube care.  Previous treatment for balanitis was effective and there are no more concerns for candidal infection.  There has been some increased drainage around the ostomy site, but no bleeding, tenderness..  05/01/22 Cory Pacheco is a 77 y.o. male who presents for continued evaluation of SP tube and ostomy wound, balanitis check.  Ostomy wound treated with silver nitrate at his visit earlier this week and SP tubing up-sized to 58F.  His wife states that ostomy site has remained dry and appears to be healing nicely.  Erythema and moisture around the distal penis has also improved.  She reports his catheter has been draining well and is easy to flush. He has had no issues with obstruction since change cath. He continues to have sediment, but she has noticed his urine is more clear than it has been in quite some time.  Patient is actually feeling much better this morning and is quite jovial.  No gross hematuria.  He has had no fever, pain.  BMP ordered earlier this week indicates no signs of kidney injury.  [facility]   04/28/22 Cory Pacheco is a 77 y.o. male who presents for continued evaluation of issues with his SP tube. Hx is provided by his very attentive wife.  Since the patient's last visit for obstruction of the SP tube 10 days ago, she has been flushing twice daily with acetic  acid.  Since the last 20 French catheter change 10 days ago, the patient has had a good amount of output until dramatic decrease 2 days ago.  Patient's wife has tried to flush the catheter without success since that time.  She notes that he has had urine leakage from around the SP tube itself as well as from his urethra. She is having to change maxi pads because of the amount of output through the urethra.  Gravity bag intact today with approximately 50 mL of dark brown urine in the bag.  He has had no change in mental status, no fever, vomiting no new complaints of pain. Bladder scan=51m   04/10/22 Cory Pacheco today in f/u for cystoscopy via his SP tract for evaluation of recurrent catheter obstruction.  He has actually been doing better following upsizing to 253fand treatment of the infection.  No chief complaint on file.   Cory Pacheco a [age].o. male with history of bladder stones and urinary retention who presents for evaluation of issues with his SP tube.  He is due for SP tube change next week.  Patient is unable to give history due to current medical conditions.  The patient's wife reports 2 days ago there was a decrease in output and home health nursing was unable to perform installation or obtain output from the catheter today.  The patient has had significant incontinence from the urethra  since the SP tube stopped functioning.  The patient's wife states he has had no fever, vomiting, other symptoms of illness.  He denies pain.  He has had some gross hematuria.  His wife also states he was treated for a UTI by Dr. Quillian Quince a few months ago with good resolution of patient's symptoms of cystitis and gross hematuria. UA = greater than 30 WBCs, greater than 30 RBCs, few bacteria, nitrite positive   Portions of the above documentation were copied from a prior visit for review purposes only.  Allergies: Allergies  Allergen Reactions   Penicillins Hives   Bupropion Other (See Comments)     Patient not sure   Metformin Other (See Comments)    Patient not sure   Sulfa Antibiotics Other (See Comments)    Patient not sure    PMH: Past Medical History:  Diagnosis Date   Anxiety    Arthritis    Depression    Hypertension    Seizure ([place])     PSH: Past Surgical History:  Procedure Laterality Date   CHOLECYSTECTOMY     COLONOSCOPY     COLONOSCOPY N/A 08/03/2013   Procedure: COLONOSCOPY;  Surgeon: Rogene Houston, MD;  [facility];  Service: Endoscopy;  Laterality: N/A;  70   TONSILLECTOMY      SH: Social History   Tobacco Use   Smoking status: Never   Smokeless tobacco: Never  Vaping Use   Vaping Use: Never used  Substance Use Topics   Alcohol use: Never   Drug use: Never    ROS: All other review of systems were reviewed and are negative except what is noted above in HPI  PE: BP 120/75   Pulse 71  GENERAL APPEARANCE: Well-appearing in reclining wheelchair with no apparent distress. HEENT:  Atraumatic, normocephalic NECK:  Supple. Trachea midline ABDOMEN:  Soft, non-tender, no masses Ostomy site does reveal thick drainage, but no odor is noted.  Prominent granular tissue appreciated with significant moisture around the site itself. MENTAL STATUS:  appropriate BACK:  Non-tender to palpation, No CVAT SKIN:  Warm, dry, and intact   Results: Laboratory Data: Lab Results  Component Value Date   WBC 8.6 01/01/2022   HGB 14.6 01/01/2022   HCT 43.6 01/01/2022   MCV 102.1 (H) 01/01/2022   PLT 162 01/01/2022    Lab Results  Component Value Date   CREATININE 0.85 04/28/2022     Urinalysis    Component Value Date/Time   COLORURINE AMBER (A) 05/28/2021 1631   APPEARANCEUR Cloudy (A) 05/15/2022 1717   LABSPEC 1.008 05/28/2021 1631   PHURINE 7.0 05/28/2021 1631   GLUCOSEU Negative 05/15/2022 1717   HGBUR MODERATE (A) 05/28/2021 1631   BILIRUBINUR Negative 05/15/2022 1717   [place] 05/28/2021 1631   PROTEINUR 2+ (A)  05/15/2022 1717   PROTEINUR 100 (A) 05/28/2021 1631   NITRITE Positive (A) 05/15/2022 1717   NITRITE NEGATIVE 05/28/2021 1631   LEUKOCYTESUR 3+ (A) 05/15/2022 1717   LEUKOCYTESUR MODERATE (A) 05/28/2021 1631    Lab Results  Component Value Date   LABMICR See below: 05/15/2022   WBCUA >30 (A) 05/15/2022   LABEPIT 0-10 05/15/2022   MUCUS Present 05/15/2022   BACTERIA Many (A) 05/15/2022    Pertinent Imaging: No results found for this or any previous visit.  No results found for this or any previous visit.  No results found for this or any previous visit.  No results found for this or any previous visit.  No results found for this or any previous visit.  No results found for this or any previous visit.  No results found for this or any previous visit.  No results found for this or any previous visit.  No results found for this or any previous visit (from the past 24 hour(s)).

## 2022-08-05 ENCOUNTER — Ambulatory Visit: Payer: No Typology Code available for payment source | Admitting: Physician Assistant

## 2022-08-07 ENCOUNTER — Other Ambulatory Visit: Payer: PPO | Admitting: Urology

## 2022-08-12 DIAGNOSIS — L89151 Pressure ulcer of sacral region, stage 1: Secondary | ICD-10-CM | POA: Diagnosis not present

## 2022-08-12 DIAGNOSIS — Z9359 Other cystostomy status: Secondary | ICD-10-CM | POA: Diagnosis not present

## 2022-08-12 DIAGNOSIS — Z515 Encounter for palliative care: Secondary | ICD-10-CM | POA: Diagnosis not present

## 2022-08-14 DIAGNOSIS — I10 Essential (primary) hypertension: Secondary | ICD-10-CM | POA: Diagnosis not present

## 2022-08-14 DIAGNOSIS — E119 Type 2 diabetes mellitus without complications: Secondary | ICD-10-CM | POA: Diagnosis not present

## 2022-08-26 ENCOUNTER — Ambulatory Visit (INDEPENDENT_AMBULATORY_CARE_PROVIDER_SITE_OTHER): Payer: No Typology Code available for payment source | Admitting: Physician Assistant

## 2022-08-26 DIAGNOSIS — R339 Retention of urine, unspecified: Secondary | ICD-10-CM

## 2022-08-26 DIAGNOSIS — L929 Granulomatous disorder of the skin and subcutaneous tissue, unspecified: Secondary | ICD-10-CM | POA: Diagnosis not present

## 2022-08-26 MED ORDER — SILVER NITRATE-POT NITRATE 75-25 % EX MISC: 1.0000 | Freq: Once | CUTANEOUS | Status: AC

## 2022-08-26 NOTE — Progress Notes (Signed)
Suprapubic Cath Change  Patient is present today for a suprapubic catheter change due to urinary retention.  59m of water was drained from the balloon, a 20 silicone FR foley cath was removed from the tract with out difficulty.  Site was cleaned and prepped in a sterile fashion with betadine.  A 20 silicone FR foley cath was replaced into the tract no complications were noted. Urine return was noted, 10 ml of sterile water was inflated into the balloon and a over night bag was attached for drainage.  Patient tolerated well. A night bag was given to patient and proper instruction was given on how to switch bags.    Performed by: SMarisue Brooklyn CMA/ [place], LPN  Follow up: Follow up as scheduled

## 2022-08-26 NOTE — Progress Notes (Signed)
Assessment: 1. Urinary retention - PR CHANGE OF BLADDER TUBE,SIMPLE  2. Granulation tissue    Plan: Follow-up in 1 week for SP tube change.  Continue 20 [place].  We will reassess the ostomy site and treat with silver nitrate if indicated.  Patient's wife will continue her excellent care.  Chief Complaint: No chief complaint on file.   HPI: Cory Pacheco is a 76 y.o. male who presents for continued evaluation of urinary retention and is scheduled for routine suprapubic catheter change.  At his last visit, he had persistence of prominent, friable granulation tissue around the ostomy site.  His wife reports minimal drainage and improvement of the site with a decrease in size of the raised tissue.Marland Kitchen   Portions of the above documentation were copied from a prior visit for review purposes only.  Allergies: Allergies  Allergen Reactions   Penicillins Hives   Bupropion Other (See Comments)    Patient not sure   Metformin Other (See Comments)    Patient not sure   Sulfa Antibiotics Other (See Comments)    Patient not sure    PMH: Past Medical History:  Diagnosis Date   Anxiety    Arthritis    Depression    Hypertension    Seizure ([place])     PSH: Past Surgical History:  Procedure Laterality Date   CHOLECYSTECTOMY     COLONOSCOPY     COLONOSCOPY N/A 08/03/2013   Procedure: COLONOSCOPY;  Surgeon: Rogene Houston, MD;  [facility];  Service: Endoscopy;  Laterality: N/A;  61   TONSILLECTOMY      SH: Social History   Tobacco Use   Smoking status: Never   Smokeless tobacco: Never  Vaping Use   Vaping Use: Never used  Substance Use Topics   Alcohol use: Never   Drug use: Never    ROS: All other review of systems were reviewed and are negative except what is noted above in HPI  PE: There were no vitals taken for this visit. WNWD, comfy and smiling today Ostomy continues to have 1-2cm area of prominent, raised, friable granulation tissue on the  right side of the site. Non tender without surrounding erythema or induration.   Procedures   Silver Nitrate sticks used to cauterize the prominent tissue with good result. Pt tolerated well  Results: Laboratory Data: Lab Results  Component Value Date   WBC 8.6 01/01/2022   HGB 14.6 01/01/2022   HCT 43.6 01/01/2022   MCV 102.1 (H) 01/01/2022   PLT 162 01/01/2022    Lab Results  Component Value Date   CREATININE 0.85 04/28/2022     Urinalysis    Component Value Date/Time   COLORURINE AMBER (A) 05/28/2021 1631   APPEARANCEUR Cloudy (A) 05/15/2022 1717   LABSPEC 1.008 05/28/2021 1631   PHURINE 7.0 05/28/2021 1631   GLUCOSEU Negative 05/15/2022 1717   HGBUR MODERATE (A) 05/28/2021 1631   BILIRUBINUR Negative 05/15/2022 1717   KETONESUR NEGATIVE 05/28/2021 1631   PROTEINUR 2+ (A) 05/15/2022 1717   PROTEINUR 100 (A) 05/28/2021 1631   NITRITE Positive (A) 05/15/2022 1717   NITRITE NEGATIVE 05/28/2021 1631   LEUKOCYTESUR 3+ (A) 05/15/2022 1717   LEUKOCYTESUR MODERATE (A) 05/28/2021 1631    Lab Results  Component Value Date   LABMICR See below: 05/15/2022   WBCUA >30 (A) 05/15/2022   LABEPIT 0-10 05/15/2022   MUCUS Present 05/15/2022   BACTERIA Many (A) 05/15/2022    Pertinent Imaging: No results found for this  or any previous visit.  No results found for this or any previous visit.  No results found for this or any previous visit.  No results found for this or any previous visit.  No results found for this or any previous visit.  No results found for this or any previous visit.  No results found for this or any previous visit.  No results found for this or any previous visit.  No results found for this or any previous visit (from the past 24 hour(s)).

## 2022-08-28 ENCOUNTER — Ambulatory Visit: Payer: No Typology Code available for payment source | Admitting: Physician Assistant

## 2022-09-16 DIAGNOSIS — Z515 Encounter for palliative care: Secondary | ICD-10-CM | POA: Diagnosis not present

## 2022-09-16 DIAGNOSIS — Z9359 Other cystostomy status: Secondary | ICD-10-CM | POA: Diagnosis not present

## 2022-09-16 DIAGNOSIS — R5381 Other malaise: Secondary | ICD-10-CM | POA: Diagnosis not present

## 2022-09-16 DIAGNOSIS — Z23 Encounter for immunization: Secondary | ICD-10-CM | POA: Diagnosis not present

## 2022-09-16 DIAGNOSIS — G20C Parkinsonism, unspecified: Secondary | ICD-10-CM | POA: Diagnosis not present

## 2022-09-16 DIAGNOSIS — M6240 Contracture of muscle, unspecified site: Secondary | ICD-10-CM | POA: Diagnosis not present

## 2022-09-16 DIAGNOSIS — N3020 Other chronic cystitis without hematuria: Secondary | ICD-10-CM | POA: Diagnosis not present

## 2022-09-16 DIAGNOSIS — Z741 Need for assistance with personal care: Secondary | ICD-10-CM | POA: Diagnosis not present

## 2022-09-16 DIAGNOSIS — Z7401 Bed confinement status: Secondary | ICD-10-CM | POA: Diagnosis not present

## 2022-09-19 DIAGNOSIS — R059 Cough, unspecified: Secondary | ICD-10-CM | POA: Diagnosis not present

## 2022-09-19 DIAGNOSIS — R0600 Dyspnea, unspecified: Secondary | ICD-10-CM | POA: Diagnosis not present

## 2022-09-23 ENCOUNTER — Ambulatory Visit (INDEPENDENT_AMBULATORY_CARE_PROVIDER_SITE_OTHER): Payer: No Typology Code available for payment source | Admitting: Physician Assistant

## 2022-09-23 VITALS — BP 110/71 | HR 90

## 2022-09-23 DIAGNOSIS — Z7189 Other specified counseling: Secondary | ICD-10-CM

## 2022-09-23 DIAGNOSIS — Z435 Encounter for attention to cystostomy: Secondary | ICD-10-CM | POA: Diagnosis not present

## 2022-09-23 DIAGNOSIS — R339 Retention of urine, unspecified: Secondary | ICD-10-CM | POA: Diagnosis not present

## 2022-09-23 NOTE — Progress Notes (Signed)
Assessment: 1. Urinary retention - PR CHANGE OF BLADDER TUBE,SIMPLE  2. Encounter for ostomy care education - silver nitrate applicators applicator 1 Application    Plan: Tx of ostomy granulation with silver nitrate sticks.  His wife continue her careful wound care which has aided in his healing.  Follow-up in 1 month for SP tube change.  Chief Complaint: No chief complaint on file.   HPI: Cory Pacheco is a 76 y.o. male who presents for continued evaluation of ongoing urinary retention.  He remains in reclining wheelchair at all times.  His wife states the ostomy site continues to improve but still appears a bit raw.  Patient has had no new medical issues since his visit 1 month ago..   08/26/22 Cory Pacheco is a 76 y.o. male who presents for continued evaluation of urinary retention and is scheduled for routine suprapubic catheter change.  At his last visit, he had persistence of prominent, friable granulation tissue around the ostomy site.  His wife reports minimal drainage and improvement of the site with a decrease in size of the raised tissue.Marland Kitchen  Portions of the above documentation were copied from a prior visit for review purposes only.  Allergies: Allergies  Allergen Reactions   Penicillins Hives   Bupropion Other (See Comments)    Patient not sure   Metformin Other (See Comments)    Patient not sure   Sulfa Antibiotics Other (See Comments)    Patient not sure    PMH: Past Medical History:  Diagnosis Date   Anxiety    Arthritis    Depression    Hypertension    Seizure ([place])     PSH: Past Surgical History:  Procedure Laterality Date   CHOLECYSTECTOMY     COLONOSCOPY     COLONOSCOPY N/A 08/03/2013   Procedure: COLONOSCOPY;  Surgeon: Rogene Houston, MD;  Location: [facility];  Service: Endoscopy;  Laterality: N/A;  43   TONSILLECTOMY      SH: Social History   Tobacco Use   Smoking status: Never   Smokeless tobacco: Never  Vaping Use    Vaping Use: Never used  Substance Use Topics   Alcohol use: Never   Drug use: Never    ROS: All other review of systems were reviewed and are negative except what is noted above in HPI  PE: BP 110/71   Pulse 90  GENERAL APPEARANCE:  Well appearing, well developed, well nourished, NAD HEENT:  Atraumatic, normocephalic NECK:  Supple. Trachea midline ABDOMEN:  Soft, non-tender, no masses.  Patient's suprapubic ostomy site continues to have 1 area laterally on the right remains moist and friable.  It is treated with silver nitrate sticks and sterile dressing applied. MENTAL STATUS:  appropriate BACK:  Non-tender to palpation, No CVAT SKIN:  Warm, dry, and intact   Results: Laboratory Data: Lab Results  Component Value Date   WBC 8.6 01/01/2022   HGB 14.6 01/01/2022   HCT 43.6 01/01/2022   MCV 102.1 (H) 01/01/2022   PLT 162 01/01/2022    Lab Results  Component Value Date   CREATININE 0.85 04/28/2022    Urinalysis    Component Value Date/Time   COLORURINE AMBER (A) 05/28/2021 1631   APPEARANCEUR Cloudy (A) 05/15/2022 1717   LABSPEC 1.008 05/28/2021 1631   PHURINE 7.0 05/28/2021 1631   GLUCOSEU Negative 05/15/2022 1717   HGBUR MODERATE (A) 05/28/2021 1631   BILIRUBINUR Negative 05/15/2022 [place] 05/28/2021 1631   PROTEINUR  2+ (A) 05/15/2022 1717   PROTEINUR 100 (A) 05/28/2021 1631   NITRITE Positive (A) 05/15/2022 1717   NITRITE NEGATIVE 05/28/2021 1631   LEUKOCYTESUR 3+ (A) 05/15/2022 1717   LEUKOCYTESUR MODERATE (A) 05/28/2021 1631    Lab Results  Component Value Date   LABMICR See below: 05/15/2022   WBCUA >30 (A) 05/15/2022   LABEPIT 0-10 05/15/2022   MUCUS Present 05/15/2022   BACTERIA Many (A) 05/15/2022    Pertinent Imaging: No results found for this or any previous visit.  No results found for this or any previous visit.  No results found for this or any previous visit.  No results found for this or any previous  visit.  No results found for this or any previous visit.  No valid procedures specified. No results found for this or any previous visit.  No results found for this or any previous visit.  No results found for this or any previous visit (from the past 24 hour(s)).

## 2022-09-23 NOTE — Progress Notes (Signed)
Suprapubic Cath Change  Patient is present today for a suprapubic catheter change due to urinary retention.  93m of water was drained from the balloon, a 258VEsilicone foley cath was removed from the tract with out difficulty.  Site was cleaned and prepped in a sterile fashion with betadine.  A 274GAsilicone foley cath was replaced into the tract no complications were noted. Urine return was noted, 10 ml of sterile water was inflated into the balloon and a bedside bag was attached for drainage.  Patient tolerated well. A night bag was given to patient and proper instruction was given on how to switch bags.    Performed by: AEstill BambergRN  Follow up:  monthly SP tube change

## 2022-09-25 MED ORDER — SILVER NITRATE-POT NITRATE 75-25 % EX MISC: 1.0000 | Freq: Once | CUTANEOUS | Status: DC

## 2022-09-30 DIAGNOSIS — R059 Cough, unspecified: Secondary | ICD-10-CM | POA: Diagnosis not present

## 2022-10-09 ENCOUNTER — Ambulatory Visit: Payer: No Typology Code available for payment source | Admitting: Physician Assistant

## 2022-10-21 ENCOUNTER — Ambulatory Visit: Payer: No Typology Code available for payment source | Admitting: Physician Assistant

## 2022-10-21 ENCOUNTER — Ambulatory Visit (INDEPENDENT_AMBULATORY_CARE_PROVIDER_SITE_OTHER): Payer: No Typology Code available for payment source | Admitting: Urology

## 2022-10-21 DIAGNOSIS — R339 Retention of urine, unspecified: Secondary | ICD-10-CM | POA: Diagnosis not present

## 2022-10-21 NOTE — Progress Notes (Signed)
Suprapubic Cath Change  Patient is present today for a suprapubic catheter change due to urinary retention.  10 ml of water was drained from the balloon, a 20 FR silicone cath was removed from the tract with out difficulty.  Site was cleaned and prepped in a sterile fashion with betadine.  A 22 FR silicone cath was replaced into the tract no complications were noted. Cathter flushed with 50 ml sterile water and 50 ml was returned.10 ml of sterile water was inflated into the balloon and a overnight  bag was attached for drainage.  Patient tolerated well. A night bag was given to patient and proper instruction was given on how to switch bags.    Performed by: Marisue Brooklyn, CMA  Follow up: Follow up as scheduled

## 2022-10-24 ENCOUNTER — Ambulatory Visit: Payer: No Typology Code available for payment source

## 2022-11-04 DIAGNOSIS — I10 Essential (primary) hypertension: Secondary | ICD-10-CM | POA: Diagnosis not present

## 2022-11-04 DIAGNOSIS — Z515 Encounter for palliative care: Secondary | ICD-10-CM | POA: Diagnosis not present

## 2022-11-04 DIAGNOSIS — N3020 Other chronic cystitis without hematuria: Secondary | ICD-10-CM | POA: Diagnosis not present

## 2022-11-04 DIAGNOSIS — I480 Paroxysmal atrial fibrillation: Secondary | ICD-10-CM | POA: Diagnosis not present

## 2022-11-04 DIAGNOSIS — Z7901 Long term (current) use of anticoagulants: Secondary | ICD-10-CM | POA: Diagnosis not present

## 2022-11-04 DIAGNOSIS — Z87440 Personal history of urinary (tract) infections: Secondary | ICD-10-CM | POA: Diagnosis not present

## 2022-11-04 DIAGNOSIS — Z8619 Personal history of other infectious and parasitic diseases: Secondary | ICD-10-CM | POA: Diagnosis not present

## 2022-11-04 DIAGNOSIS — Z9359 Other cystostomy status: Secondary | ICD-10-CM | POA: Diagnosis not present

## 2022-11-10 ENCOUNTER — Ambulatory Visit (INDEPENDENT_AMBULATORY_CARE_PROVIDER_SITE_OTHER): Payer: No Typology Code available for payment source | Admitting: Physician Assistant

## 2022-11-10 DIAGNOSIS — R339 Retention of urine, unspecified: Secondary | ICD-10-CM

## 2022-11-10 NOTE — Progress Notes (Unsigned)
Suprapubic Cath Change  Patient is present today for a suprapubic catheter change due to urinary retention.  10 ml of water was drained from the balloon, a 22 FR foley cath was removed from the tract with out difficulty.  Site was cleaned and prepped in a sterile fashion with betadine.  A 22 FR foley cath was replaced into the tract no complications were noted. Urine return was noted upon flushing 10 ml of sterile water was inflated into the balloon and a overnight bag was attached for drainage.  Patient tolerated well. A night bag was given to patient and proper instruction was given on how to switch bags.    Performed by: Marisue Brooklyn, CMA  Follow up: Follow up as scheduled

## 2022-11-11 ENCOUNTER — Encounter: Payer: Self-pay | Admitting: Physician Assistant

## 2022-11-25 ENCOUNTER — Ambulatory Visit (INDEPENDENT_AMBULATORY_CARE_PROVIDER_SITE_OTHER): Payer: No Typology Code available for payment source | Admitting: Urology

## 2022-11-25 ENCOUNTER — Ambulatory Visit: Payer: No Typology Code available for payment source

## 2022-11-25 DIAGNOSIS — R339 Retention of urine, unspecified: Secondary | ICD-10-CM

## 2022-11-25 NOTE — Progress Notes (Signed)
Suprapubic Cath Change  Patient is present today for a suprapubic catheter change due to urinary retention.  66m of water was drained from the balloon, a 22 FR silicone foley cath was removed from the tract with out difficulty.  Site was cleaned and prepped in a sterile fashion with betadine.  A 22 FR silicone foley cath was replaced into the tract no complications were noted. Urine return was noted, 10 ml of sterile water was inflated into the balloon and a bedside bag was attached for drainage.  Patient tolerated well. A night bag was given to patient and proper instruction was given on how to switch bags.    Performed by: Amori Colomb LPN  Follow up: 1 month [place]

## 2022-12-02 ENCOUNTER — Ambulatory Visit: Payer: No Typology Code available for payment source

## 2022-12-23 ENCOUNTER — Ambulatory Visit (INDEPENDENT_AMBULATORY_CARE_PROVIDER_SITE_OTHER): Payer: No Typology Code available for payment source | Admitting: Urology

## 2022-12-23 DIAGNOSIS — Z515 Encounter for palliative care: Secondary | ICD-10-CM | POA: Diagnosis not present

## 2022-12-23 DIAGNOSIS — I509 Heart failure, unspecified: Secondary | ICD-10-CM | POA: Diagnosis not present

## 2022-12-23 DIAGNOSIS — G20A1 Parkinson's disease without dyskinesia, without mention of fluctuations: Secondary | ICD-10-CM | POA: Diagnosis not present

## 2022-12-23 DIAGNOSIS — R339 Retention of urine, unspecified: Secondary | ICD-10-CM | POA: Diagnosis not present

## 2022-12-23 DIAGNOSIS — Z87440 Personal history of urinary (tract) infections: Secondary | ICD-10-CM | POA: Diagnosis not present

## 2022-12-23 DIAGNOSIS — Z8619 Personal history of other infectious and parasitic diseases: Secondary | ICD-10-CM | POA: Diagnosis not present

## 2022-12-23 DIAGNOSIS — Z931 Gastrostomy status: Secondary | ICD-10-CM | POA: Diagnosis not present

## 2022-12-23 DIAGNOSIS — Z9359 Other cystostomy status: Secondary | ICD-10-CM | POA: Diagnosis not present

## 2022-12-23 DIAGNOSIS — I480 Paroxysmal atrial fibrillation: Secondary | ICD-10-CM | POA: Diagnosis not present

## 2022-12-23 DIAGNOSIS — N3020 Other chronic cystitis without hematuria: Secondary | ICD-10-CM | POA: Diagnosis not present

## 2022-12-23 NOTE — Progress Notes (Signed)
Suprapubic Cath Change  Patient is present today for a suprapubic catheter change due to urinary retention.  15m of water was drained from the balloon, a 262UQsilicone foley cath was removed from the tract with out difficulty.  Site was cleaned and prepped in a sterile fashion with betadine.  A 233HLsilicone foley cath was replaced into the tract no complications were noted. Urine return was noted, 10 ml of sterile water was inflated into the balloon and a bedside bag was attached for drainage.  Patient tolerated well.   Performed by: Ginny Loomer LPN  Follow up: f/u 3 wk sp cath change

## 2022-12-30 ENCOUNTER — Ambulatory Visit: Payer: No Typology Code available for payment source

## 2023-01-08 ENCOUNTER — Ambulatory Visit (INDEPENDENT_AMBULATORY_CARE_PROVIDER_SITE_OTHER): Payer: No Typology Code available for payment source | Admitting: Urology

## 2023-01-08 DIAGNOSIS — R8290 Unspecified abnormal findings in urine: Secondary | ICD-10-CM

## 2023-01-08 DIAGNOSIS — R339 Retention of urine, unspecified: Secondary | ICD-10-CM

## 2023-01-08 MED ORDER — DOXYCYCLINE HYCLATE 100 MG PO CAPS: 100.0000 mg | ORAL_CAPSULE | Freq: Two times a day (BID) | ORAL | 0 refills | Status: DC

## 2023-01-08 NOTE — Progress Notes (Signed)
Suprapubic Cath Change  Patient is present today for a suprapubic catheter change due to urinary retention.  52m of water was drained from the balloon, a 215PPsilicone foley cath was removed from the tract with out difficulty.  Site was cleaned and prepped in a sterile fashion with betadine.  A 294FEsilicone foley cath was replaced into the tract no complications were noted. Urine return was noted, 10 ml of sterile water was inflated into the balloon and a bed bag was attached for drainage.  Patient tolerated well.  Catheter was flushed with 200cc of sterile water.  Urine sent for ua and culture.  Verbal from Dr. WJeffie Pollockto send in Doxycycline '100mg'$  BID for 7 days.  Rx sent and wife notified.   Performed by: KLevi Aland CMA  Follow up: Follow up as scheduled.

## 2023-01-09 LAB — MICROSCOPIC EXAMINATION
RBC, Urine: >30 /hpf — AB (ref 0–2)
WBC, UA: >30 /hpf — AB (ref 0–5)

## 2023-01-09 LAB — URINALYSIS, ROUTINE W REFLEX MICROSCOPIC
Bilirubin, UA: NEGATIVE
Glucose, UA: NEGATIVE
Nitrite, UA: POSITIVE — AB
Specific Gravity, UA: 1.02 (ref 1.005–1.030)
Urobilinogen, Ur: 2 mg/dL — ABNORMAL HIGH (ref 0.2–1.0)
pH, UA: 7 (ref 5.0–7.5)

## 2023-01-13 ENCOUNTER — Ambulatory Visit: Payer: No Typology Code available for payment source

## 2023-01-13 LAB — URINE CULTURE

## 2023-01-14 ENCOUNTER — Telehealth: Payer: Self-pay

## 2023-01-14 DIAGNOSIS — R310 Gross hematuria: Secondary | ICD-10-CM

## 2023-01-14 NOTE — Telephone Encounter (Signed)
-----  Message from Irine Seal, MD sent at 01/14/2023  1:03 PM EST ----- Mx species.  No antibiotics needed.  ----- Message ----- From: Sherrilyn Rist, CMA Sent: 01/13/2023   4:18 PM EST To: Irine Seal, MD  Please review

## 2023-01-14 NOTE — Telephone Encounter (Signed)
Patient is aware that the UA/UC showed mix species and no antibiotics needed. Wife/Patient voiced that the patient is continually having bleeding off and on. Wife voiced one day it's clear and other day's there is brownish blood. Made wife aware that a task will be sent to Dr. Jeffie Pollock and someone will reach back out to her. Wife voiced understanding.

## 2023-01-16 NOTE — Telephone Encounter (Signed)
Wife is aware that a CT hematuria study will be schedule and authorized. Patient need cystoscopy to assess the cause of bleeding. Wife voiced understanding

## 2023-01-20 ENCOUNTER — Ambulatory Visit: Payer: No Typology Code available for payment source

## 2023-01-27 ENCOUNTER — Ambulatory Visit (INDEPENDENT_AMBULATORY_CARE_PROVIDER_SITE_OTHER): Payer: No Typology Code available for payment source | Admitting: Urology

## 2023-01-27 DIAGNOSIS — R339 Retention of urine, unspecified: Secondary | ICD-10-CM | POA: Diagnosis not present

## 2023-01-27 NOTE — Progress Notes (Signed)
Suprapubic Cath Change  Patient is present today for a suprapubic catheter change due to urinary retention.  9m of water was drained from the balloon, a 2Q000111Qsiliconefoley cath was removed from the tract with out difficulty.  Site was cleaned and prepped in a sterile fashion with betadine.  A 2Q000111Qsilicone foley cath was replaced into the tract no complications were noted. Catheter was flushed with 300cc of sterile water with return was noted, 10 ml of sterile water was inflated into the balloon and a bed bag was attached for drainage.  Patient tolerated well. A night bag was given to patient and proper instruction was given on how to switch bags.    Performed by: KLevi Aland CMA  Follow up: Follow up with MD as scheduled.

## 2023-02-16 ENCOUNTER — Ambulatory Visit (HOSPITAL_COMMUNITY): Payer: No Typology Code available for payment source

## 2023-02-17 ENCOUNTER — Encounter (HOSPITAL_COMMUNITY): Payer: Self-pay | Admitting: Radiology

## 2023-02-17 ENCOUNTER — Ambulatory Visit (HOSPITAL_COMMUNITY)
Admission: RE | Admit: 2023-02-17 | Discharge: 2023-02-17 | Disposition: A | Discharge status: Home / Self Care | Payer: No Typology Code available for payment source | Source: Ambulatory Visit | Attending: Urology | Admitting: Urology

## 2023-02-17 DIAGNOSIS — R310 Gross hematuria: Secondary | ICD-10-CM | POA: Insufficient documentation

## 2023-02-17 LAB — POCT I-STAT CREATININE: Creatinine, Ser: 0.8 mg/dL (ref 0.61–1.24)

## 2023-02-17 MED ORDER — IOHEXOL 300 MG/ML  SOLN
125.0000 mL | Freq: Once | INTRAMUSCULAR | Status: AC | PRN
  Administered 2023-02-17: 125 mL via INTRAVENOUS

## 2023-02-19 ENCOUNTER — Ambulatory Visit: Payer: No Typology Code available for payment source | Admitting: Urology

## 2023-02-19 ENCOUNTER — Encounter: Payer: Self-pay | Admitting: Urology

## 2023-02-19 DIAGNOSIS — Z435 Encounter for attention to cystostomy: Secondary | ICD-10-CM

## 2023-02-19 DIAGNOSIS — R310 Gross hematuria: Secondary | ICD-10-CM | POA: Diagnosis not present

## 2023-02-19 DIAGNOSIS — R339 Retention of urine, unspecified: Secondary | ICD-10-CM

## 2023-02-19 DIAGNOSIS — L929 Granulomatous disorder of the skin and subcutaneous tissue, unspecified: Secondary | ICD-10-CM | POA: Diagnosis not present

## 2023-02-19 DIAGNOSIS — N200 Calculus of kidney: Secondary | ICD-10-CM

## 2023-02-19 MED ORDER — CIPROFLOXACIN HCL 500 MG PO TABS
500.0000 mg | ORAL_TABLET | Freq: Once | ORAL | Status: AC
  Administered 2023-02-19: 500 mg via ORAL

## 2023-02-19 NOTE — Progress Notes (Addendum)
Catheter Removal  Patient is present today for a catheter removal for Cysto procedure.  20m of water was drained from the balloon. A 2Q000111Qsilicone foley cath was removed from the bladder, no complications were noted. Patient tolerated well.  Performed by: KLevi Aland CMA  Follow up/ Additional notes: MD to see after for Cystoscopy.

## 2023-02-19 NOTE — Progress Notes (Signed)
Subjective:  1. Urinary retention   2. Gross hematuria   3. Granulation tissue   4. 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 Renal stones     Cory Pacheco returns today for cystoscopy to assess his bladder because of rapid encrustation of the SP tube and hematuria.  He has granulation tissue around the ostia.  He a CT prior to this visit that showed 2 75m non-obstructing right renal stones and cysts along with some bladder wall thickening.      ROS:  ROS:  A complete review of systems was performed.  All systems are negative except for pertinent findings as noted.   ROS  Allergies  Allergen Reactions   Penicillins Hives   Bupropion Other (See Comments)    Patient not sure   Metformin Other (See Comments)    Patient not sure   Sulfa Antibiotics Other (See Comments)    Patient not sure    Outpatient Encounter Medications as of 02/19/2023  Medication Sig   acetaminophen (TYLENOL) 160 MG/5ML elixir Take 650 mg by mouth every 4 (four) hours as needed for fever.   acetic acid 0.25 % irrigation Irrigate with as directed 2 (two) times daily. Instill 60cc twice a day through suprapubic tube to keep catheter patent   albuterol (PROVENTIL HFA;VENTOLIN HFA) 108 (90 Base) MCG/ACT inhaler Inhale 2 puffs into the lungs every 6 (six) hours as needed for wheezing or shortness of breath.   apixaban (ELIQUIS) 5 MG TABS tablet 5 mg by Does not apply route 2 (two) times daily.   Ascorbic Acid (VITAMIN C) 1000 MG tablet Take 1,000 mg by mouth daily.   benzonatate (TESSALON) 200 MG capsule Take 200 mg by mouth 3 (three) times daily as needed.   carbidopa-levodopa (PARCOPA) 25-100 MG disintegrating tablet Take 2 tablets by mouth 4 (four) times daily.   Cholecalciferol (VITAMIN D3) 2000 UNITS TABS Take 1 tablet by mouth daily.   clotrimazole (LOTRIMIN) 1 % cream Apply 1 application. topically 2 (two) times daily.   diltiazem (CARDIZEM) 30 MG tablet Take 30 mg by mouth 3 (three) times daily.   divalproex  (DEPAKOTE) 500 MG DR tablet Take 1 tablet (500 mg total) by mouth 3 (three) times daily.   Divalproex Sodium (DEPAKOTE PO) 5 mLs by Feeding Tube route 3 (three) times daily. 250/5 mls   doxycycline (VIBRAMYCIN) 100 MG capsule Take 1 capsule (100 mg total) by mouth every 12 (twelve) hours.   fluticasone (FLONASE) 50 MCG/ACT nasal spray Place 2 sprays into both nostrils daily.   folic acid (FOLVITE) 1 MG tablet Take 2 mg by mouth daily.   furosemide (LASIX) 10 MG/ML solution Place 20 mg into feeding tube daily.   lacosamide (VIMPAT) 10 MG/ML oral solution Take by mouth See admin instructions. 20 ml by mouth twice a day   lansoprazole (PREVACID SOLUTAB) 30 MG disintegrating tablet Take 30 mg by mouth 2 (two) times daily.   metoprolol tartrate (LOPRESSOR) 50 MG tablet Take 1 tablet (50 mg total) by mouth 3 (three) times daily.   Nutritional Supplements (FEEDING SUPPLEMENT, OSMOLITE 1.2 CAL,) LIQD Place into feeding tube daily. 2 quarts every day   nystatin (MYCOSTATIN/NYSTOP) powder Apply 1 application topically 3 (three) times daily.   protein supplement (PROSOURCE NO CARB) LIQD 30 mLs.   risperiDONE (RISPERDAL) 1 MG/ML oral solution Take by mouth See admin instructions. Give 1 and 1/2 ml s by route of feeding tube twice a day.   zinc sulfate 220 (50 Zn) MG capsule  Take 220 mg by mouth daily.   Facility-Administered Encounter Medications as of 02/19/2023  Medication   ciprofloxacin (CIPRO) tablet 500 mg   silver nitrate applicators applicator 1 Application   silver nitrate applicators applicator 1 Application    Past Medical History:  Diagnosis Date   Anxiety    Arthritis    Depression    Hypertension    Seizure ([place])     Past Surgical History:  Procedure Laterality Date   CHOLECYSTECTOMY     COLONOSCOPY     COLONOSCOPY N/A 08/03/2013   Procedure: COLONOSCOPY;  Surgeon: Rogene Houston, MD;  Location: [facility];  Service: Endoscopy;  Laterality: N/A;  78   TONSILLECTOMY       Social History   Socioeconomic History   Marital status: Married    Spouse name: Not on file   Number of children: Not on file   Years of education: Not on file   Highest education level: Not on file  Occupational History   Not on file  Tobacco Use   Smoking status: Never   Smokeless tobacco: Never  Vaping Use   Vaping Use: Never used  Substance and Sexual Activity   Alcohol use: Never   Drug use: Never   Sexual activity: Yes  Other Topics Concern   Not on file  Social History Narrative   Not on file   Social Determinants of Health   Financial Resource Strain: Not on file  Food Insecurity: Not on file  Transportation Needs: Unmet Transportation Needs (03/05/2022)   PRAPARE - Hydrologist (Medical): Yes    Lack of Transportation (Non-Medical): Yes  Physical Activity: Not on file  Stress: Not on file  Social Connections: Not on file  Intimate Partner Violence: Not on file    Family History  Problem Relation Age of Onset   Cancer Sister    Sudden death Neg Hx    Sickle cell trait Neg Hx    Lupus Neg Hx    Anesthesia problems Neg Hx        Objective: There were no vitals filed for this visit.   Physical Exam  There is a ring of granulation tissue around the SP tube.  Lab Results:  PSA No results found for: "PSA" No results found for: "TESTOSTERONE"    Studies/Results: CT HEMATURIA WORKUP  Result Date: 02/18/2023 CLINICAL DATA:  Blood in urine.  Infection. EXAM: CT ABDOMEN AND PELVIS WITHOUT AND WITH CONTRAST TECHNIQUE: Multidetector CT imaging of the abdomen and pelvis was performed following the standard protocol before and following the bolus administration of intravenous contrast. RADIATION DOSE REDUCTION: This exam was performed according to the departmental dose-optimization program which includes automated exposure control, adjustment of the mA and/or kV according to patient size and/or use of iterative  reconstruction technique. CONTRAST:  121m OMNIPAQUE IOHEXOL 300 MG/ML  SOLN COMPARISON:  None Available. FINDINGS: Lower chest: There is some linear opacity lung bases likely scar or atelectasis. No pleural effusion on the right tiny left effusion some pleural thickening. Coronary artery calcifications are seen. Hepatobiliary: Slight nodular contour of the liver. No space-occupying liver lesion separate tiny peripheral cyst measuring 4 mm in the right hepatic lobe on series 7, image 21. No specific imaging follow up. Patent portal vein. Previous cholecystectomy. Pancreas: Mild pancreatic atrophy. No obvious mass or ductal dilatation. Spleen: Normal in size without focal abnormality. Adrenals/Urinary Tract: The adrenal glands are preserved. No left-sided renal or ureteral stone. No right-sided  ureteral stone. There are some intrarenal nonobstructing stones on the right side. The larger focus measures 7 mm. Two right-sided renal stones total. No enhancing renal masses. There is a simple cyst seen along each kidney. On the left there is posterior lesion measuring 4.5 cm in maximum transverse dimension with Hounsfield unit of near 0. Right-sided focus has a similar Hounsfield unit and diameter of 2.7 cm. No collecting system dilatation. Calices are delicately cupped. The ureters have normal course and caliber down to the bladder. There is suprapubic catheter in place. The bladder itself is underdistended with wall thickening and some dependent air. Please correlate for any clinical evidence of cystitis. Slight adjacent stranding. Stomach/Bowel: There is a redundant course of the sigmoid colon extending into the upper abdomen. Overall large diffuse colonic stool. The large bowel is nondilated. Normal appendix in the right lower quadrant. There is a G-tube in the stomach. Stomach is nondilated. Small bowel is nondilated. Vascular/Lymphatic: Normal caliber aorta and IVC. Diffuse vascular calcifications along the aorta.  Some along branch vessels. Circumaortic left renal vein. No specific abnormal lymph node enlargement is seen in the abdomen and pelvis. Reproductive: Prominent prostate.  Atrophic seminal vesicles. Other: No ascites.  Mild anasarca.  Diffuse muscle atrophy. Musculoskeletal: Diffuse syndesmophytes along the spine with fixation hardware in ankylosis. Osteopenia. Degenerative changes seen throughout the pelvis also with areas of ankylosis. Please correlate with clinical history. IMPRESSION: Two nonobstructing right-sided renal stones. Bilateral simple appearing renal cysts. No enhancing mass or collecting system dilatation. Suprapubic catheter in place with wall thickening and slight stranding. Please correlate for any clinical evidence of cystitis. Diffuse large amount of colonic stool without obstruction. G-tube. Electronically Signed   By: Jill Side M.D.   On: 02/18/2023 17:22   No results found for this or any previous visit.  No results found for this or any previous visit.  No results found for this or any previous visit.  No results found for this or any previous visit.  No results found for this or any previous visit.  No valid procedures specified. Results for orders placed in visit on 01/14/23  CT HEMATURIA WORKUP  Narrative CLINICAL DATA:  Blood in urine.  Infection.  EXAM: CT ABDOMEN AND PELVIS WITHOUT AND WITH CONTRAST  TECHNIQUE: Multidetector CT imaging of the abdomen and pelvis was performed following the standard protocol before and following the bolus administration of intravenous contrast.  RADIATION DOSE REDUCTION: This exam was performed according to the departmental dose-optimization program which includes automated exposure control, adjustment of the mA and/or kV according to patient size and/or use of iterative reconstruction technique.  CONTRAST:  185m OMNIPAQUE IOHEXOL 300 MG/ML  SOLN  COMPARISON:  None Available.  FINDINGS: Lower chest: There is some  linear opacity lung bases likely scar or atelectasis. No pleural effusion on the right tiny left effusion some pleural thickening. Coronary artery calcifications are seen.  Hepatobiliary: Slight nodular contour of the liver. No space-occupying liver lesion separate tiny peripheral cyst measuring 4 mm in the right hepatic lobe on series 7, image 21. No specific imaging follow up. Patent portal vein. Previous cholecystectomy.  Pancreas: Mild pancreatic atrophy. No obvious mass or ductal dilatation.  Spleen: Normal in size without focal abnormality.  Adrenals/Urinary Tract: The adrenal glands are preserved. No left-sided renal or ureteral stone. No right-sided ureteral stone. There are some intrarenal nonobstructing stones on the right side. The larger focus measures 7 mm. Two right-sided renal stones total. No enhancing renal masses. There is a  simple cyst seen along each kidney. On the left there is posterior lesion measuring 4.5 cm in maximum transverse dimension with Hounsfield unit of near 0. Right-sided focus has a similar Hounsfield unit and diameter of 2.7 cm. No collecting system dilatation. Calices are delicately cupped. The ureters have normal course and caliber down to the bladder. There is suprapubic catheter in place. The bladder itself is underdistended with wall thickening and some dependent air. Please correlate for any clinical evidence of cystitis. Slight adjacent stranding.  Stomach/Bowel: There is a redundant course of the sigmoid colon extending into the upper abdomen. Overall large diffuse colonic stool. The large bowel is nondilated. Normal appendix in the right lower quadrant. There is a G-tube in the stomach. Stomach is nondilated. Small bowel is nondilated.  Vascular/Lymphatic: Normal caliber aorta and IVC. Diffuse vascular calcifications along the aorta. Some along branch vessels. Circumaortic left renal vein. No specific abnormal lymph node enlargement  is seen in the abdomen and pelvis.  Reproductive: Prominent prostate.  Atrophic seminal vesicles.  Other: No ascites.  Mild anasarca.  Diffuse muscle atrophy.  Musculoskeletal: Diffuse syndesmophytes along the spine with fixation hardware in ankylosis. Osteopenia. Degenerative changes seen throughout the pelvis also with areas of ankylosis. Please correlate with clinical history.  IMPRESSION: Two nonobstructing right-sided renal stones. Bilateral simple appearing renal cysts. No enhancing mass or collecting system dilatation.  Suprapubic catheter in place with wall thickening and slight stranding. Please correlate for any clinical evidence of cystitis.  Diffuse large amount of colonic stool without obstruction.  G-tube.   Electronically Signed By: Jill Side M.D. On: 02/18/2023 17:22  No results found for this or any previous visit.  Procedure:  Flexible cystoscopy.  The SP tube was removed and the SP ostia was prepped with betadine.  The scope was passed easily.  No stones were seen but there was mucosal erythema with some edema consistent with chronic inflammation from the tube.  The UO's weren't visualized.  No tumors were seen.   A 64f silastic foley was replaced and the granulation tissue around the ostium was cauterized with silver nitrate.   He was given cipro '500mg'$  po.   Assessment & Plan: Chronic retention.  He has bladder inflammation with the SP tube.  Continue current care.    Gross hematuria.  This appears to be from inflammatory changes in the bladder.    Renal stones and cysts were noted on CT but need no treatment.   Meds ordered this encounter  Medications   ciprofloxacin (CIPRO) tablet 500 mg     Orders Placed This Encounter  Procedures   Foley catheter - discontinue   INSERT,TEMP INDWELLING BLAD CATH,SIMPLE      Return in about 1 year (around 02/19/2024) for continue q3wk tube changes and return in a year for cystoscopy. .   CC: DCaryl Bis MD      JIrine Seal3/06/2023

## 2023-03-02 ENCOUNTER — Telehealth: Payer: Self-pay

## 2023-03-02 NOTE — Telephone Encounter (Signed)
FYI-  Upcoming SP catheter change on 03/27.  Do you have any recommendations until then?

## 2023-03-02 NOTE — Telephone Encounter (Signed)
Patient's urine was: Green on Saturday Cleared up good this morning Murky, dark brown, bloody this afternoon and continues to be.  Not running a fever, or feeling bad.  Please advise.  Call back:  [phone]

## 2023-03-02 NOTE — Telephone Encounter (Signed)
Wife aware of MD instructions and will keep upcoming apt.  She will call by Thursday if symptoms do not improve while MD is in office.

## 2023-03-11 ENCOUNTER — Ambulatory Visit: Payer: No Typology Code available for payment source

## 2023-03-12 ENCOUNTER — Ambulatory Visit (INDEPENDENT_AMBULATORY_CARE_PROVIDER_SITE_OTHER): Payer: No Typology Code available for payment source | Admitting: Urology

## 2023-03-12 DIAGNOSIS — R339 Retention of urine, unspecified: Secondary | ICD-10-CM | POA: Diagnosis not present

## 2023-03-12 DIAGNOSIS — R310 Gross hematuria: Secondary | ICD-10-CM

## 2023-03-12 NOTE — Progress Notes (Signed)
Suprapubic Cath Change  Patient is present today for a suprapubic catheter change due to urinary retention.  63ml of water was drained from the balloon, a 22FR silacone foley cath was removed from the tract with out difficulty.  Site was cleaned and prepped in a sterile fashion with betadine.  A 22FR silacone foley cath was replaced into the tract complications were noted as: difficulty removing due to sediment around catheter . Urine return was noted, 10 ml of sterile water was inflated into the balloon and a bedside bag was attached for drainage.  Patient tolerated well. Dr. Jeffie Pollock made aware. Urine sent off for culture at wife's request.   Performed by: Angeles Zehner LPN  Follow up: keep scheduled NV

## 2023-03-15 LAB — URINE CULTURE

## 2023-03-16 ENCOUNTER — Telehealth: Payer: Self-pay

## 2023-03-16 NOTE — Telephone Encounter (Signed)
Patient/Wife called today about urine culture and aware that results sent to dr. Jeffie Pollock. Urine culture sent 04/01

## 2023-03-17 ENCOUNTER — Telehealth: Payer: Self-pay

## 2023-03-17 NOTE — Telephone Encounter (Signed)
Pt's wife aware of culture results.  She will follow up as scheduled.

## 2023-03-17 NOTE — Telephone Encounter (Signed)
-----   Message from Irine Seal, MD sent at 03/16/2023 12:37 PM EDT ----- Cx is multiple species and as such doesn't need treatment.  ----- Message ----- From: Audie Box, CMA Sent: 03/16/2023   8:34 AM EDT To: Irine Seal, MD  Please review.

## 2023-03-30 ENCOUNTER — Ambulatory Visit: Payer: No Typology Code available for payment source

## 2023-03-31 ENCOUNTER — Ambulatory Visit: Payer: No Typology Code available for payment source

## 2023-04-02 DIAGNOSIS — R6521 Severe sepsis with septic shock: Secondary | ICD-10-CM | POA: Diagnosis not present

## 2023-04-02 DIAGNOSIS — G4733 Obstructive sleep apnea (adult) (pediatric): Secondary | ICD-10-CM | POA: Diagnosis not present

## 2023-04-02 DIAGNOSIS — N390 Urinary tract infection, site not specified: Secondary | ICD-10-CM | POA: Diagnosis not present

## 2023-04-02 DIAGNOSIS — A419 Sepsis, unspecified organism: Secondary | ICD-10-CM | POA: Diagnosis not present

## 2023-04-02 DIAGNOSIS — R569 Unspecified convulsions: Secondary | ICD-10-CM | POA: Diagnosis not present

## 2023-04-02 DIAGNOSIS — I5032 Chronic diastolic (congestive) heart failure: Secondary | ICD-10-CM | POA: Diagnosis not present

## 2023-04-02 DIAGNOSIS — B961 Klebsiella pneumoniae [K. pneumoniae] as the cause of diseases classified elsewhere: Secondary | ICD-10-CM | POA: Diagnosis not present

## 2023-04-02 DIAGNOSIS — R404 Transient alteration of awareness: Secondary | ICD-10-CM | POA: Diagnosis not present

## 2023-04-02 DIAGNOSIS — Z880 Allergy status to penicillin: Secondary | ICD-10-CM | POA: Diagnosis not present

## 2023-04-02 DIAGNOSIS — Z79899 Other long term (current) drug therapy: Secondary | ICD-10-CM | POA: Diagnosis not present

## 2023-04-02 DIAGNOSIS — K219 Gastro-esophageal reflux disease without esophagitis: Secondary | ICD-10-CM | POA: Diagnosis not present

## 2023-04-02 DIAGNOSIS — T83510A Infection and inflammatory reaction due to cystostomy catheter, initial encounter: Secondary | ICD-10-CM | POA: Diagnosis not present

## 2023-04-02 DIAGNOSIS — G40409 Other generalized epilepsy and epileptic syndromes, not intractable, without status epilepticus: Secondary | ICD-10-CM | POA: Diagnosis not present

## 2023-04-02 DIAGNOSIS — A4150 Gram-negative sepsis, unspecified: Secondary | ICD-10-CM | POA: Diagnosis not present

## 2023-04-02 DIAGNOSIS — Z882 Allergy status to sulfonamides status: Secondary | ICD-10-CM | POA: Diagnosis not present

## 2023-04-02 DIAGNOSIS — G9340 Encephalopathy, unspecified: Secondary | ICD-10-CM | POA: Diagnosis not present

## 2023-04-02 DIAGNOSIS — I4821 Permanent atrial fibrillation: Secondary | ICD-10-CM | POA: Diagnosis not present

## 2023-04-02 DIAGNOSIS — B965 Pseudomonas (aeruginosa) (mallei) (pseudomallei) as the cause of diseases classified elsewhere: Secondary | ICD-10-CM | POA: Diagnosis not present

## 2023-04-02 DIAGNOSIS — Z66 Do not resuscitate: Secondary | ICD-10-CM | POA: Diagnosis not present

## 2023-04-02 DIAGNOSIS — Z931 Gastrostomy status: Secondary | ICD-10-CM | POA: Diagnosis not present

## 2023-04-02 DIAGNOSIS — Z881 Allergy status to other antibiotic agents status: Secondary | ICD-10-CM | POA: Diagnosis not present

## 2023-04-02 DIAGNOSIS — G319 Degenerative disease of nervous system, unspecified: Secondary | ICD-10-CM | POA: Insufficient documentation

## 2023-04-02 DIAGNOSIS — N3001 Acute cystitis with hematuria: Secondary | ICD-10-CM | POA: Diagnosis not present

## 2023-04-02 DIAGNOSIS — R000 Tachycardia, unspecified: Secondary | ICD-10-CM | POA: Diagnosis not present

## 2023-04-02 DIAGNOSIS — I2510 Atherosclerotic heart disease of native coronary artery without angina pectoris: Secondary | ICD-10-CM | POA: Diagnosis not present

## 2023-04-02 DIAGNOSIS — R059 Cough, unspecified: Secondary | ICD-10-CM | POA: Diagnosis not present

## 2023-04-02 DIAGNOSIS — Z7901 Long term (current) use of anticoagulants: Secondary | ICD-10-CM | POA: Diagnosis not present

## 2023-04-02 DIAGNOSIS — G40909 Epilepsy, unspecified, not intractable, without status epilepticus: Secondary | ICD-10-CM | POA: Diagnosis not present

## 2023-04-02 DIAGNOSIS — G20A1 Parkinson's disease without dyskinesia, without mention of fluctuations: Secondary | ICD-10-CM | POA: Diagnosis not present

## 2023-04-02 DIAGNOSIS — J189 Pneumonia, unspecified organism: Secondary | ICD-10-CM | POA: Diagnosis not present

## 2023-04-02 DIAGNOSIS — G249 Dystonia, unspecified: Secondary | ICD-10-CM | POA: Diagnosis not present

## 2023-04-02 DIAGNOSIS — F209 Schizophrenia, unspecified: Secondary | ICD-10-CM | POA: Diagnosis not present

## 2023-04-02 DIAGNOSIS — Z452 Encounter for adjustment and management of vascular access device: Secondary | ICD-10-CM | POA: Diagnosis not present

## 2023-04-02 DIAGNOSIS — Z20822 Contact with and (suspected) exposure to covid-19: Secondary | ICD-10-CM | POA: Diagnosis not present

## 2023-04-02 DIAGNOSIS — R0689 Other abnormalities of breathing: Secondary | ICD-10-CM | POA: Diagnosis not present

## 2023-04-02 DIAGNOSIS — Z9989 Dependence on other enabling machines and devices: Secondary | ICD-10-CM | POA: Diagnosis not present

## 2023-04-02 DIAGNOSIS — F2089 Other schizophrenia: Secondary | ICD-10-CM | POA: Diagnosis not present

## 2023-04-02 DIAGNOSIS — I110 Hypertensive heart disease with heart failure: Secondary | ICD-10-CM | POA: Diagnosis not present

## 2023-04-02 DIAGNOSIS — A4152 Sepsis due to Pseudomonas: Secondary | ICD-10-CM | POA: Diagnosis not present

## 2023-04-02 DIAGNOSIS — R4182 Altered mental status, unspecified: Secondary | ICD-10-CM | POA: Diagnosis not present

## 2023-04-02 DIAGNOSIS — I5030 Unspecified diastolic (congestive) heart failure: Secondary | ICD-10-CM | POA: Diagnosis not present

## 2023-04-02 DIAGNOSIS — F203 Undifferentiated schizophrenia: Secondary | ICD-10-CM | POA: Insufficient documentation

## 2023-04-02 DIAGNOSIS — I10 Essential (primary) hypertension: Secondary | ICD-10-CM | POA: Diagnosis not present

## 2023-04-02 DIAGNOSIS — B964 Proteus (mirabilis) (morganii) as the cause of diseases classified elsewhere: Secondary | ICD-10-CM | POA: Diagnosis not present

## 2023-04-02 DIAGNOSIS — I4891 Unspecified atrial fibrillation: Secondary | ICD-10-CM | POA: Diagnosis not present

## 2023-04-02 DIAGNOSIS — R55 Syncope and collapse: Secondary | ICD-10-CM | POA: Diagnosis not present

## 2023-04-02 DIAGNOSIS — N179 Acute kidney failure, unspecified: Secondary | ICD-10-CM | POA: Diagnosis not present

## 2023-04-03 ENCOUNTER — Ambulatory Visit: Payer: No Typology Code available for payment source

## 2023-04-03 DIAGNOSIS — E440 Moderate protein-calorie malnutrition: Secondary | ICD-10-CM | POA: Insufficient documentation

## 2023-04-20 ENCOUNTER — Ambulatory Visit: Payer: No Typology Code available for payment source

## 2023-04-22 ENCOUNTER — Ambulatory Visit: Payer: No Typology Code available for payment source

## 2023-04-22 DIAGNOSIS — R339 Retention of urine, unspecified: Secondary | ICD-10-CM | POA: Diagnosis not present

## 2023-04-22 NOTE — Progress Notes (Signed)
Suprapubic Cath Change  Patient is present today for a suprapubic catheter change due to urinary retention.  10 ml of water was drained from the balloon, a 22 FR silicone foley cath was removed from the tract with out difficulty.  Site was cleaned and prepped in a sterile fashion with betadine.  A 22 FR  silicone foley cath was replaced into the tract no complications were noted. 20 ml flush return was noted, 10 ml of sterile water was inflated into the balloon and a night bag was attached for drainage.  Patient tolerated well. A night bag was given to patient and proper instruction was given on how to switch bags.    Performed by: Kennyth Lose, CMA  Follow up: No follow-ups on file.

## 2023-05-07 DIAGNOSIS — Z515 Encounter for palliative care: Secondary | ICD-10-CM | POA: Diagnosis not present

## 2023-05-07 DIAGNOSIS — G20C Parkinsonism, unspecified: Secondary | ICD-10-CM | POA: Diagnosis not present

## 2023-05-13 ENCOUNTER — Ambulatory Visit (INDEPENDENT_AMBULATORY_CARE_PROVIDER_SITE_OTHER): Payer: No Typology Code available for payment source

## 2023-05-13 ENCOUNTER — Ambulatory Visit: Payer: No Typology Code available for payment source

## 2023-05-13 DIAGNOSIS — R339 Retention of urine, unspecified: Secondary | ICD-10-CM | POA: Diagnosis not present

## 2023-05-13 NOTE — Progress Notes (Cosign Needed Addendum)
Suprapubic Cath Change  Patient is present today for a suprapubic catheter change due to urinary retention.  10 ml of water was drained from the balloon, a 22 FR foley silicone cath was removed from the tract with out difficulty.  Site was cleaned and prepped in a sterile fashion with betadine.  A 22 FR foley silicone cath was replaced into the tract no complications were noted. Flush return was noted, 10 ml of sterile water was inflated into the balloon and a night bag was attached for drainage.  Patient tolerated well. A night bag was given to patient and proper instruction was given on how to switch bags.    Performed by: Kennyth Lose, CMA  Follow up: No follow-ups on file.

## 2023-05-14 DIAGNOSIS — E440 Moderate protein-calorie malnutrition: Secondary | ICD-10-CM | POA: Diagnosis not present

## 2023-05-14 DIAGNOSIS — F209 Schizophrenia, unspecified: Secondary | ICD-10-CM | POA: Diagnosis not present

## 2023-05-14 DIAGNOSIS — Z931 Gastrostomy status: Secondary | ICD-10-CM | POA: Diagnosis not present

## 2023-05-14 DIAGNOSIS — Z9350 Unspecified cystostomy status: Secondary | ICD-10-CM | POA: Diagnosis not present

## 2023-05-14 DIAGNOSIS — I4821 Permanent atrial fibrillation: Secondary | ICD-10-CM | POA: Diagnosis not present

## 2023-05-14 DIAGNOSIS — G319 Degenerative disease of nervous system, unspecified: Secondary | ICD-10-CM | POA: Diagnosis not present

## 2023-05-14 DIAGNOSIS — D6869 Other thrombophilia: Secondary | ICD-10-CM | POA: Diagnosis not present

## 2023-05-14 DIAGNOSIS — Z978 Presence of other specified devices: Secondary | ICD-10-CM | POA: Diagnosis not present

## 2023-05-14 DIAGNOSIS — G40309 Generalized idiopathic epilepsy and epileptic syndromes, not intractable, without status epilepticus: Secondary | ICD-10-CM | POA: Diagnosis not present

## 2023-05-14 DIAGNOSIS — G20A1 Parkinson's disease without dyskinesia, without mention of fluctuations: Secondary | ICD-10-CM | POA: Diagnosis not present

## 2023-06-03 ENCOUNTER — Ambulatory Visit: Payer: No Typology Code available for payment source

## 2023-06-03 DIAGNOSIS — R339 Retention of urine, unspecified: Secondary | ICD-10-CM | POA: Diagnosis not present

## 2023-06-03 DIAGNOSIS — T83090D Other mechanical complication of cystostomy catheter, subsequent encounter: Secondary | ICD-10-CM

## 2023-06-05 DIAGNOSIS — Z515 Encounter for palliative care: Secondary | ICD-10-CM | POA: Diagnosis not present

## 2023-06-05 DIAGNOSIS — G20C Parkinsonism, unspecified: Secondary | ICD-10-CM | POA: Diagnosis not present

## 2023-06-05 NOTE — Progress Notes (Addendum)
Suprapubic Cath Change  Patient is present today for a suprapubic catheter change due to urinary retention.  10 ml of water was drained from the balloon, a 22 FR foley silicone cath was removed from the tract with out difficulty.  Site was cleaned and prepped in a sterile fashion with betadine.  A 22 FR foley silicone cath was replaced into the tract no complications were noted. Urine return was noted, 10 ml of sterile water was inflated into the balloon and a foley cath bag was attached for drainage.  Patient tolerated well. A stat lock was given to patient and proper instruction was given on how to switch bags.    Performed by: Maryland Pink RMA  Follow up: F/UP with scheduled nurse visit

## 2023-06-30 ENCOUNTER — Ambulatory Visit (INDEPENDENT_AMBULATORY_CARE_PROVIDER_SITE_OTHER): Payer: No Typology Code available for payment source

## 2023-06-30 DIAGNOSIS — R339 Retention of urine, unspecified: Secondary | ICD-10-CM

## 2023-06-30 MED ORDER — CIPROFLOXACIN HCL 500 MG PO TABS
500.0000 mg | ORAL_TABLET | Freq: Once | ORAL | Status: AC
Start: 2023-06-30 — End: 2023-06-30
  Administered 2023-06-30: 500 mg via ORAL

## 2023-06-30 NOTE — Progress Notes (Signed)
Suprapubic Cath Change  Patient is present today for a suprapubic catheter change due to urinary retention.  10ml of water was drained from the balloon, a 22FR silicone foley cath was removed from the tract with out difficulty.  Site was cleaned and prepped in a sterile fashion with betadine.  A 22FR silicone foley cath was replaced into the tract no complications were noted. Catheter was flushed with 100cc of sterile water with return noted, 10 ml of sterile water was inflated into the balloon and a bed bag was attached for drainage.  Patient tolerated well. A night bag was given to patient and proper instruction was given on how to switch bags.    Performed by: Guss Bunde, CMA  Follow up: as scheduled.

## 2023-07-06 ENCOUNTER — Encounter (INDEPENDENT_AMBULATORY_CARE_PROVIDER_SITE_OTHER): Payer: Self-pay | Admitting: *Deleted

## 2023-07-21 ENCOUNTER — Ambulatory Visit (INDEPENDENT_AMBULATORY_CARE_PROVIDER_SITE_OTHER): Payer: No Typology Code available for payment source

## 2023-07-21 DIAGNOSIS — R339 Retention of urine, unspecified: Secondary | ICD-10-CM

## 2023-07-21 MED ORDER — CIPROFLOXACIN HCL 500 MG PO TABS
500.0000 mg | ORAL_TABLET | Freq: Once | ORAL | Status: AC
Start: 2023-07-21 — End: 2023-07-21
  Administered 2023-07-21: 500 mg via ORAL

## 2023-07-21 NOTE — Progress Notes (Signed)
Suprapubic Cath Change  Patient is present today for a suprapubic catheter change due to urinary retention.  10ml of water was drained from the balloon, a 22FR silicone foley cath was removed from the tract with out difficulty.  Site was cleaned and prepped in a sterile fashion with betadine.  A 22FR silicone foley cath was replaced into the tract no complications were noted. Urine return was noted, 10 ml of sterile water was inflated into the balloon and a bed bag was attached for drainage.  Patient tolerated well.  Performed by: Guss Bunde, CMA  Follow up: Follow up as scheduled.

## 2023-07-24 DIAGNOSIS — G20C Parkinsonism, unspecified: Secondary | ICD-10-CM | POA: Diagnosis not present

## 2023-07-24 DIAGNOSIS — Z515 Encounter for palliative care: Secondary | ICD-10-CM | POA: Diagnosis not present

## 2023-08-11 ENCOUNTER — Ambulatory Visit (INDEPENDENT_AMBULATORY_CARE_PROVIDER_SITE_OTHER): Payer: No Typology Code available for payment source

## 2023-08-11 DIAGNOSIS — R339 Retention of urine, unspecified: Secondary | ICD-10-CM | POA: Diagnosis not present

## 2023-08-11 MED ORDER — CIPROFLOXACIN HCL 500 MG PO TABS
500.0000 mg | ORAL_TABLET | Freq: Once | ORAL | Status: AC
Start: 2023-08-11 — End: 2023-08-11
  Administered 2023-08-11: 500 mg via ORAL

## 2023-08-11 NOTE — Progress Notes (Signed)
Suprapubic Cath Change  Patient is present today for a suprapubic catheter change due to urinary retention.  10ml of water was drained from the balloon, a 22FR foley cath was removed from the tract with out difficulty.  Site was cleaned and prepped in a sterile fashion with betadine.  A 22FR foley cath was replaced into the tract no complications were noted. Urine return was noted, 10 ml of sterile water was inflated into the balloon and a bed bag was attached for drainage.  Patient tolerated well. A night bag was given to patient and proper instruction was given on how to switch bags.    Performed by: Guss Bunde, CMA  Follow up: follow up as scheduled.

## 2023-08-20 DIAGNOSIS — Z931 Gastrostomy status: Secondary | ICD-10-CM | POA: Diagnosis not present

## 2023-08-20 DIAGNOSIS — E261 Secondary hyperaldosteronism: Secondary | ICD-10-CM | POA: Diagnosis not present

## 2023-08-20 DIAGNOSIS — I5032 Chronic diastolic (congestive) heart failure: Secondary | ICD-10-CM | POA: Diagnosis not present

## 2023-08-20 DIAGNOSIS — Z741 Need for assistance with personal care: Secondary | ICD-10-CM | POA: Diagnosis not present

## 2023-08-20 DIAGNOSIS — Z6834 Body mass index (BMI) 34.0-34.9, adult: Secondary | ICD-10-CM | POA: Diagnosis not present

## 2023-08-20 DIAGNOSIS — I110 Hypertensive heart disease with heart failure: Secondary | ICD-10-CM | POA: Diagnosis not present

## 2023-08-20 DIAGNOSIS — I4821 Permanent atrial fibrillation: Secondary | ICD-10-CM | POA: Diagnosis not present

## 2023-08-20 DIAGNOSIS — D6869 Other thrombophilia: Secondary | ICD-10-CM | POA: Diagnosis not present

## 2023-08-20 DIAGNOSIS — G20A1 Parkinson's disease without dyskinesia, without mention of fluctuations: Secondary | ICD-10-CM | POA: Diagnosis not present

## 2023-08-20 DIAGNOSIS — Z515 Encounter for palliative care: Secondary | ICD-10-CM | POA: Diagnosis not present

## 2023-08-20 DIAGNOSIS — Z7901 Long term (current) use of anticoagulants: Secondary | ICD-10-CM | POA: Diagnosis not present

## 2023-08-20 DIAGNOSIS — Z9359 Other cystostomy status: Secondary | ICD-10-CM | POA: Diagnosis not present

## 2023-09-01 ENCOUNTER — Ambulatory Visit (INDEPENDENT_AMBULATORY_CARE_PROVIDER_SITE_OTHER): Payer: No Typology Code available for payment source

## 2023-09-01 DIAGNOSIS — R339 Retention of urine, unspecified: Secondary | ICD-10-CM | POA: Diagnosis not present

## 2023-09-01 MED ORDER — CIPROFLOXACIN HCL 500 MG PO TABS
500.0000 mg | ORAL_TABLET | Freq: Once | ORAL | Status: AC
Start: 2023-09-01 — End: 2023-09-01
  Administered 2023-09-01: 500 mg via ORAL

## 2023-09-01 NOTE — Progress Notes (Signed)
Suprapubic Cath Change  Patient is present today for a suprapubic catheter change due to urinary retention.  10ml of water was drained from the balloon, a 22FR  silicone foley cath was removed from the tract with out difficulty.  Site was cleaned and prepped in a sterile fashion with betadine.  A 22 FR silicone foley cath was replaced into the tract no complications were noted. Catheter flushed with 30cc of sterile water with return, 10 ml of sterile water was inflated into the balloon and a bed bag was attached for drainage.  Patient tolerated well. A night bag was given to patient and proper instruction was given on how to switch bags.    Performed by: Guss Bunde, CMA  Follow up: Follow up with Maralyn Sago with SP cath chagne

## 2023-09-15 DIAGNOSIS — Z515 Encounter for palliative care: Secondary | ICD-10-CM | POA: Diagnosis not present

## 2023-09-15 DIAGNOSIS — G20C Parkinsonism, unspecified: Secondary | ICD-10-CM | POA: Diagnosis not present

## 2023-09-22 DIAGNOSIS — F319 Bipolar disorder, unspecified: Secondary | ICD-10-CM | POA: Insufficient documentation

## 2023-09-22 DIAGNOSIS — Z86711 Personal history of pulmonary embolism: Secondary | ICD-10-CM | POA: Insufficient documentation

## 2023-09-22 DIAGNOSIS — G20B1 Parkinson's disease with dyskinesia, without mention of fluctuations: Secondary | ICD-10-CM | POA: Insufficient documentation

## 2023-09-22 DIAGNOSIS — Z961 Presence of intraocular lens: Secondary | ICD-10-CM | POA: Insufficient documentation

## 2023-09-22 DIAGNOSIS — D3131 Benign neoplasm of right choroid: Secondary | ICD-10-CM | POA: Insufficient documentation

## 2023-09-22 DIAGNOSIS — N200 Calculus of kidney: Secondary | ICD-10-CM | POA: Insufficient documentation

## 2023-09-22 DIAGNOSIS — H510 Palsy (spasm) of conjugate gaze: Secondary | ICD-10-CM | POA: Insufficient documentation

## 2023-09-22 DIAGNOSIS — Z9359 Other cystostomy status: Secondary | ICD-10-CM | POA: Insufficient documentation

## 2023-09-22 DIAGNOSIS — F445 Conversion disorder with seizures or convulsions: Secondary | ICD-10-CM | POA: Insufficient documentation

## 2023-09-22 DIAGNOSIS — E669 Obesity, unspecified: Secondary | ICD-10-CM | POA: Insufficient documentation

## 2023-09-22 DIAGNOSIS — R339 Retention of urine, unspecified: Secondary | ICD-10-CM | POA: Insufficient documentation

## 2023-09-22 DIAGNOSIS — Z86718 Personal history of other venous thrombosis and embolism: Secondary | ICD-10-CM | POA: Insufficient documentation

## 2023-09-22 DIAGNOSIS — R1310 Dysphagia, unspecified: Secondary | ICD-10-CM | POA: Insufficient documentation

## 2023-09-22 DIAGNOSIS — E782 Mixed hyperlipidemia: Secondary | ICD-10-CM | POA: Insufficient documentation

## 2023-09-22 DIAGNOSIS — R4182 Altered mental status, unspecified: Secondary | ICD-10-CM | POA: Insufficient documentation

## 2023-09-22 DIAGNOSIS — C439 Malignant melanoma of skin, unspecified: Secondary | ICD-10-CM | POA: Insufficient documentation

## 2023-09-22 DIAGNOSIS — Z741 Need for assistance with personal care: Secondary | ICD-10-CM | POA: Insufficient documentation

## 2023-09-22 DIAGNOSIS — I480 Paroxysmal atrial fibrillation: Secondary | ICD-10-CM | POA: Insufficient documentation

## 2023-09-22 DIAGNOSIS — N319 Neuromuscular dysfunction of bladder, unspecified: Secondary | ICD-10-CM | POA: Insufficient documentation

## 2023-09-22 DIAGNOSIS — L570 Actinic keratosis: Secondary | ICD-10-CM | POA: Insufficient documentation

## 2023-09-22 DIAGNOSIS — Z981 Arthrodesis status: Secondary | ICD-10-CM | POA: Insufficient documentation

## 2023-09-22 DIAGNOSIS — N281 Cyst of kidney, acquired: Secondary | ICD-10-CM | POA: Insufficient documentation

## 2023-09-22 NOTE — Progress Notes (Signed)
Name: Cory Pacheco DOB: September 25, 1946 MRN: [id number]  History of Present Illness: Cory Pacheco is a 77 y.o. male who presents today for follow up visit at [facility] [place]. He is accompanied by his wife Cory Pacheco, who provides today's history. - GU history: 1. Neurogenic bladder with chronic urinary retention. Managed with SP tube. 2. Kidney stones. 3. Renal cysts.  CT on 02/17/2023 showed two nonobstructing right-sided renal stones and bilateral simple renal cysts.   At last visit with Dr. Annabell Pacheco on 02/19/2023: - Cystoscopy performed for evaluation of rapid encrustation of the SP tube and hematuria. Findings included granulation tissue around the SP ostia and bladder mucosal erythema with some edema consistent with chronic inflammation from the SP tube. The granulation tissue around the ostium was cauterized with silver nitrate.   Since last visit: Has had urology nurse visits monthly for SP tube exchange, most recently on 09/01/2023.  Today: [name] reports some bleeding around SP tube site, which bleeds a small amount sometimes. She reports keeping the area clean with gentle foaming cleanser and covers the area with split 4x4 gauze and tape.    Fall Screening: Do you usually have a device to assist in your mobility? Yes - wheelchair (patient is non-ambulatory)  Medications: Current Outpatient Medications  Medication Sig Dispense Refill   acetaminophen (TYLENOL) 160 MG/5ML elixir Take 650 mg by mouth every 4 (four) hours as needed for fever.     acetic acid 0.25 % irrigation Irrigate with as directed 2 (two) times daily. Instill 60cc twice a day through suprapubic tube to keep catheter patent 500 mL 12   albuterol (PROVENTIL HFA;VENTOLIN HFA) 108 (90 Base) MCG/ACT inhaler Inhale 2 puffs into the lungs every 6 (six) hours as needed for wheezing or shortness of breath.     apixaban (ELIQUIS) 5 MG TABS tablet 5 mg by Does not apply route 2 (two) times daily.      Ascorbic Acid (VITAMIN C) 1000 MG tablet Take 1,000 mg by mouth daily.     benzonatate (TESSALON) 200 MG capsule Take 200 mg by mouth 3 (three) times daily as needed.     carbidopa-levodopa (PARCOPA) 25-100 MG disintegrating tablet Take 2 tablets by mouth 4 (four) times daily.     Cholecalciferol (VITAMIN D3) 2000 UNITS TABS Take 1 tablet by mouth daily.     clotrimazole (LOTRIMIN) 1 % cream Apply 1 application. topically 2 (two) times daily. 30 g 0   diltiazem (CARDIZEM) 30 MG tablet Take 30 mg by mouth 3 (three) times daily.     divalproex (DEPAKOTE) 500 MG DR tablet Take 1 tablet (500 mg total) by mouth 3 (three) times daily.     Divalproex Sodium (DEPAKOTE PO) 5 mLs by Feeding Tube route 3 (three) times daily. 250/5 mls     doxycycline (VIBRAMYCIN) 100 MG capsule Take 1 capsule (100 mg total) by mouth every 12 (twelve) hours. 14 capsule 0   fluticasone (FLONASE) 50 MCG/ACT nasal spray Place 2 sprays into both nostrils daily.     folic acid (FOLVITE) 1 MG tablet Take 2 mg by mouth daily.     furosemide (LASIX) 10 MG/ML solution Place 20 mg into feeding tube daily.     lacosamide (VIMPAT) 10 MG/ML oral solution Take by mouth See admin instructions. 20 ml by mouth twice a day     lansoprazole (PREVACID SOLUTAB) 30 MG disintegrating tablet Take 30 mg by mouth 2 (two) times daily.     metoprolol tartrate (LOPRESSOR) 50  MG tablet Take 1 tablet (50 mg total) by mouth 3 (three) times daily. 270 tablet 3   Nutritional Supplements (FEEDING SUPPLEMENT, OSMOLITE 1.2 CAL,) LIQD Place into feeding tube daily. 2 quarts every day     nystatin (MYCOSTATIN/NYSTOP) powder Apply 1 application topically 3 (three) times daily.     protein supplement (PROSOURCE NO CARB) LIQD 30 mLs.     risperiDONE (RISPERDAL) 1 MG/ML oral solution Take by mouth See admin instructions. Give 1 and 1/2 ml s by route of feeding tube twice a day.     zinc sulfate 220 (50 Zn) MG capsule Take 220 mg by mouth daily.     Current  Facility-Administered Medications  Medication Dose Route Frequency Provider Last Rate Last Admin   silver nitrate applicators applicator 1 Application  1 Application Topical Once [place], Regan Rakers, PA-C       silver nitrate applicators applicator 1 Application  1 Application Topical Once [place], Regan Rakers, PA-C        Allergies: Allergies  Allergen Reactions   Penicillins Hives   Bupropion Other (See Comments)    Patient not sure   Metformin Other (See Comments)    Patient not sure   Sulfa Antibiotics Other (See Comments)    Patient not sure    Past Medical History:  Diagnosis Date   Anxiety    Arthritis    Depression    Hypertension    Seizure (HCC)    Past Surgical History:  Procedure Laterality Date   CHOLECYSTECTOMY     COLONOSCOPY     COLONOSCOPY N/A 08/03/2013   Procedure: COLONOSCOPY;  Surgeon: Malissa Hippo, MD;  Location: [facility];  Service: Endoscopy;  Laterality: N/A;  930   TONSILLECTOMY     Family History  Problem Relation Age of Onset   Cancer Sister    Sudden death Neg Hx    Sickle cell trait Neg Hx    Lupus Neg Hx    Anesthesia problems Neg Hx    Social History   Socioeconomic History   Marital status: Married    Spouse name: Not on file   Number of children: Not on file   Years of education: Not on file   Highest education level: Not on file  Occupational History   Not on file  Tobacco Use   Smoking status: Never   Smokeless tobacco: Never  Vaping Use   Vaping status: Never Used  Substance and Sexual Activity   Alcohol use: Never   Drug use: Never   Sexual activity: Yes  Other Topics Concern   Not on file  Social History Narrative   Not on file   Social Determinants of Health   Financial Resource Strain: Not on file  Food Insecurity: Not on file  Transportation Needs: Unmet Transportation Needs (03/05/2022)   PRAPARE - Transportation    Lack of Transportation (Medical): Yes    Lack of Transportation  (Non-Medical): Yes  Physical Activity: Inactive (04/03/2023)   Received from [facility], [facility]   Exercise Vital Sign    Days of Exercise per Week: 0 days    Minutes of Exercise per Session: 0 min  Stress: Not on file  Social Connections: Not on file  Intimate Partner Violence: Not on file   Patient unable to participate in Review of Systems GU: As per HPI.  OBJECTIVE Vitals:   09/29/23 1126  BP: 102/67  Pulse: 78  Temp: 98.3 F (36.8 C)   Body  mass index is 34.45 kg/m.  Physical Examination Constitutional: No obvious distress Cardiovascular: No visible lower extremity edema.  Respiratory: The patient does not have audible wheezing/stridor Gastrointestinal: Abdomen non-distended Musculoskeletal: Patient is non-mobile  Neurologic: Unable to assess; patient non-verbal during visit Hematologic/Lymphatic/Immunologic: No obvious bruises or sites of spontaneous bleeding  GU: SP tube site with no surrounding erythema, warmth, edema, or tenderness. Draining clear yellow urine into catheter bag. Granulation tissue present circumferentially around the SP ostia with small amount of blood which appears to be due to tissue irritation from the SP tube. The granulation tissue was cauterized with silver nitrate.   ASSESSMENT / PLAN Neurogenic bladder - Plan: PR CHANGE CYSTOSTOMY TUBE SIMPLE, ciprofloxacin (CIPRO) tablet 500 mg  Chronic retention of urine  Chronic suprapubic catheter (HCC)  Kidney stones  Renal cyst  Granulation tissue at [facility] ostium cauterized today with silver nitrate; patient's wife Cory Pacheco states this has been needed intermittently over the years. We discussed option to discuss excision of granulation tissue with Dr. Ronne Binning; she decided to proceed with expectant management for now until Mr. Hennes's scheduled appointment with Dr. Ronne Binning on 02/22/2024. Will continue with monthly nurse visits for SP tube exchanges. All questions were  answered.   Orders Placed This Encounter  Procedures   PR CHANGE CYSTOSTOMY TUBE SIMPLE    It has been explained that the patient is to follow regularly with their PCP in addition to all other providers involved in their care and to follow instructions provided by these respective offices. Patient advised to contact urology clinic if any urologic-pertaining questions, concerns, new symptoms or problems arise in the interim period.  There are no Patient Instructions on file for this visit.  Electronically signed by:  Donnita Falls, FNP   09/29/23    12:53 PM

## 2023-09-29 ENCOUNTER — Ambulatory Visit (INDEPENDENT_AMBULATORY_CARE_PROVIDER_SITE_OTHER): Payer: No Typology Code available for payment source | Admitting: Urology

## 2023-09-29 ENCOUNTER — Ambulatory Visit: Payer: No Typology Code available for payment source

## 2023-09-29 ENCOUNTER — Encounter: Payer: Self-pay | Admitting: Urology

## 2023-09-29 VITALS — BP 102/67 | HR 78 | Temp 98.3°F | Ht 71.0 in | Wt 247.0 lb

## 2023-09-29 DIAGNOSIS — N200 Calculus of kidney: Secondary | ICD-10-CM

## 2023-09-29 DIAGNOSIS — N281 Cyst of kidney, acquired: Secondary | ICD-10-CM | POA: Diagnosis not present

## 2023-09-29 DIAGNOSIS — N319 Neuromuscular dysfunction of bladder, unspecified: Secondary | ICD-10-CM | POA: Diagnosis not present

## 2023-09-29 DIAGNOSIS — Z9359 Other cystostomy status: Secondary | ICD-10-CM

## 2023-09-29 DIAGNOSIS — R339 Retention of urine, unspecified: Secondary | ICD-10-CM

## 2023-09-29 MED ORDER — CIPROFLOXACIN HCL 500 MG PO TABS
500.0000 mg | ORAL_TABLET | Freq: Once | ORAL | Status: AC
Start: 2023-09-29 — End: 2023-09-29
  Administered 2023-09-29: 500 mg via ORAL

## 2023-09-29 NOTE — Progress Notes (Signed)
Suprapubic Cath Change  Patient is present today for a suprapubic catheter change due to urinary retention.  10ml of water was drained from the balloon, a 22FR silicone foley cath was removed from the tract with out difficulty.  Site was cleaned and prepped in a sterile fashion with betadine.  A 22FR silicone foley cath was replaced into the tract no complications were noted. Urine return was noted, 10 ml of sterile water was inflated into the balloon and a bed bag was attached for drainage.  Patient tolerated well.   Performed by: Guss Bunde, CMA  Follow up: NP tp see after

## 2023-10-27 ENCOUNTER — Ambulatory Visit (INDEPENDENT_AMBULATORY_CARE_PROVIDER_SITE_OTHER): Payer: No Typology Code available for payment source

## 2023-10-27 DIAGNOSIS — R339 Retention of urine, unspecified: Secondary | ICD-10-CM

## 2023-10-27 MED ORDER — CIPROFLOXACIN HCL 500 MG PO TABS
500.0000 mg | ORAL_TABLET | Freq: Once | ORAL | Status: DC
Start: 2023-10-27 — End: 2023-12-17

## 2023-10-27 NOTE — Progress Notes (Signed)
Suprapubic Cath Change  Patient is present today for a suprapubic catheter change due to urinary retention.  10ml of water was drained from the balloon, a 22FR silicone foley cath was removed from the tract with out difficulty.  Site was cleaned and prepped in a sterile fashion with betadine.  A 22FR silicone foley cath was replaced into the tract no complications were noted. Urine return was noted, 10 ml of sterile water was inflated into the balloon and a bedside bag was attached for drainage.  Patient tolerated well. A night bag was given to patient and proper instruction was given on how to switch bags.    Performed by: Braxten Memmer LPN  Follow up: No follow-ups on file.

## 2023-10-29 DIAGNOSIS — G20C Parkinsonism, unspecified: Secondary | ICD-10-CM | POA: Diagnosis not present

## 2023-10-29 DIAGNOSIS — Z515 Encounter for palliative care: Secondary | ICD-10-CM | POA: Diagnosis not present

## 2023-11-18 ENCOUNTER — Encounter: Payer: Self-pay | Admitting: Urology

## 2023-11-24 ENCOUNTER — Ambulatory Visit (INDEPENDENT_AMBULATORY_CARE_PROVIDER_SITE_OTHER): Payer: No Typology Code available for payment source

## 2023-11-24 DIAGNOSIS — R339 Retention of urine, unspecified: Secondary | ICD-10-CM | POA: Diagnosis not present

## 2023-11-24 DIAGNOSIS — N319 Neuromuscular dysfunction of bladder, unspecified: Secondary | ICD-10-CM

## 2023-11-24 NOTE — Progress Notes (Signed)
Cath Change/ Replacement  Patient is present today for a catheter change due to urinary retention.  10ml of water was removed from the balloon, a 22FR Silicone foley cath was removed without difficulty.  Patient was cleaned and prepped in a sterile fashion with betadine and 2% lidocaine jelly was instilled into the urethra. A 22 FR silicone foley cath was replaced into the bladder, no complications were noted. Urine return was noted 60ml and urine was yellow in color. The balloon was filled with 10ml of sterile water. A bedside bag was attached for drainage.  A night bag was also given to the patient and patient was given instruction on how to change from one bag to another. Patient was given proper instruction on catheter care.    Performed by: Aviance Cooperwood LPN  Follow up: No follow-ups on file.

## 2023-11-27 DIAGNOSIS — Z8679 Personal history of other diseases of the circulatory system: Secondary | ICD-10-CM | POA: Diagnosis not present

## 2023-11-27 DIAGNOSIS — I509 Heart failure, unspecified: Secondary | ICD-10-CM | POA: Diagnosis not present

## 2023-11-27 DIAGNOSIS — G20A1 Parkinson's disease without dyskinesia, without mention of fluctuations: Secondary | ICD-10-CM | POA: Diagnosis not present

## 2023-12-11 DIAGNOSIS — G20C Parkinsonism, unspecified: Secondary | ICD-10-CM | POA: Diagnosis not present

## 2023-12-11 DIAGNOSIS — Z515 Encounter for palliative care: Secondary | ICD-10-CM | POA: Diagnosis not present

## 2023-12-15 DIAGNOSIS — G40909 Epilepsy, unspecified, not intractable, without status epilepticus: Secondary | ICD-10-CM | POA: Diagnosis not present

## 2023-12-15 DIAGNOSIS — I4821 Permanent atrial fibrillation: Secondary | ICD-10-CM | POA: Diagnosis not present

## 2023-12-17 ENCOUNTER — Ambulatory Visit: Payer: No Typology Code available for payment source

## 2023-12-17 ENCOUNTER — Other Ambulatory Visit: Payer: Self-pay | Admitting: Urology

## 2023-12-17 DIAGNOSIS — Z9359 Other cystostomy status: Secondary | ICD-10-CM

## 2023-12-17 DIAGNOSIS — N319 Neuromuscular dysfunction of bladder, unspecified: Secondary | ICD-10-CM

## 2023-12-17 MED ORDER — AMBULATORY NON FORMULARY MEDICATION
99 refills | Status: AC
Start: 2023-12-17 — End: ?

## 2023-12-17 MED ORDER — CIPROFLOXACIN HCL 500 MG PO TABS
500.0000 mg | ORAL_TABLET | Freq: Once | ORAL | Status: AC
Start: 2023-12-17 — End: 2023-12-17
  Administered 2023-12-17: 500 mg via ORAL

## 2023-12-17 NOTE — Progress Notes (Signed)
 Suprapubic Cath Change  Patient is present today for a suprapubic catheter change due to urinary retention.  10ml of water  was drained from the balloon, a 22FR silicone foley cath was removed from the tract with out difficulty.  Site was cleaned and prepped in a sterile fashion with betadine.  A 22FR foley cath was replaced into the tract no complications were noted. Urine return was noted, 10 ml of sterile water  was inflated into the balloon and a bed bag was attached for drainage.  Patient tolerated well. A night bag was given to patient and proper instruction was given on how to switch bags.    Performed by: Veleria, CMA  Follow up: as scheduled.

## 2023-12-23 ENCOUNTER — Ambulatory Visit: Payer: No Typology Code available for payment source

## 2023-12-24 ENCOUNTER — Ambulatory Visit: Payer: No Typology Code available for payment source

## 2023-12-29 ENCOUNTER — Ambulatory Visit: Payer: No Typology Code available for payment source

## 2024-01-07 ENCOUNTER — Ambulatory Visit: Payer: No Typology Code available for payment source

## 2024-01-15 DIAGNOSIS — R300 Dysuria: Secondary | ICD-10-CM | POA: Diagnosis not present

## 2024-01-15 DIAGNOSIS — N3001 Acute cystitis with hematuria: Secondary | ICD-10-CM | POA: Diagnosis not present

## 2024-01-16 DIAGNOSIS — Z20822 Contact with and (suspected) exposure to covid-19: Secondary | ICD-10-CM | POA: Diagnosis not present

## 2024-01-16 DIAGNOSIS — G319 Degenerative disease of nervous system, unspecified: Secondary | ICD-10-CM | POA: Diagnosis not present

## 2024-01-16 DIAGNOSIS — A419 Sepsis, unspecified organism: Secondary | ICD-10-CM | POA: Diagnosis not present

## 2024-01-16 DIAGNOSIS — R0689 Other abnormalities of breathing: Secondary | ICD-10-CM | POA: Diagnosis not present

## 2024-01-16 DIAGNOSIS — I517 Cardiomegaly: Secondary | ICD-10-CM | POA: Diagnosis not present

## 2024-01-16 DIAGNOSIS — I4891 Unspecified atrial fibrillation: Secondary | ICD-10-CM | POA: Diagnosis not present

## 2024-01-16 DIAGNOSIS — R059 Cough, unspecified: Secondary | ICD-10-CM | POA: Diagnosis not present

## 2024-01-16 DIAGNOSIS — I700 Atherosclerosis of aorta: Secondary | ICD-10-CM | POA: Diagnosis not present

## 2024-01-16 DIAGNOSIS — Z452 Encounter for adjustment and management of vascular access device: Secondary | ICD-10-CM | POA: Diagnosis not present

## 2024-01-16 DIAGNOSIS — N3000 Acute cystitis without hematuria: Secondary | ICD-10-CM | POA: Diagnosis not present

## 2024-01-16 DIAGNOSIS — R4182 Altered mental status, unspecified: Secondary | ICD-10-CM | POA: Diagnosis not present

## 2024-01-16 DIAGNOSIS — J929 Pleural plaque without asbestos: Secondary | ICD-10-CM | POA: Diagnosis not present

## 2024-01-16 DIAGNOSIS — F2089 Other schizophrenia: Secondary | ICD-10-CM | POA: Diagnosis not present

## 2024-01-16 DIAGNOSIS — I4821 Permanent atrial fibrillation: Secondary | ICD-10-CM | POA: Diagnosis not present

## 2024-01-16 DIAGNOSIS — I6782 Cerebral ischemia: Secondary | ICD-10-CM | POA: Diagnosis not present

## 2024-01-19 ENCOUNTER — Ambulatory Visit: Payer: No Typology Code available for payment source

## 2024-01-21 DIAGNOSIS — I4821 Permanent atrial fibrillation: Secondary | ICD-10-CM | POA: Diagnosis not present

## 2024-01-21 DIAGNOSIS — G319 Degenerative disease of nervous system, unspecified: Secondary | ICD-10-CM | POA: Diagnosis not present

## 2024-01-21 DIAGNOSIS — F2089 Other schizophrenia: Secondary | ICD-10-CM | POA: Diagnosis not present

## 2024-01-21 NOTE — Discharge Summary (Signed)
 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) tube (CMS-HCC)   Cerebral atrophy (CMS-HCC)   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  When he came to the ER he had acute kidney injury and confusion.  CT of the brain showed no acute lesions.  He was started on IV vancomycin  and cefepime  due to the fact that he most likely had sepsis related to catheter associated UTI.  During the hospitalization we continued vancomycin  and cefepime .  The blood culture likely had a contaminant with Staphylococcus hominis but the urine grew out methicillin-resistant Staphylococcus aureus sensitive to doxycycline .  We have switched him to doxycycline  which he is able to take orally.  He is now ready to go home as  he has multiple caregivers through [facility] as well as his wife.  During the hospitalization he had hypokalemia related to IV Lasix  but this is resolved.  He will resume his other medications and we will do a telemed visit next week.  He will continue to receive his medications through the [facility] and his wife need to call.  I have asked her to increase free water  to see if we can prevent this.  I spent less than 30 mins in the discharge of this patient.  Allergies   Allergies  Allergen Reactions  . Penicillins Hives  . Sulfa (Sulfonamide Antibiotics) Hives  . Azithromycin   . Bupropion Rash    Patient not sure Patient not sure   . Metformin Rash    Patient not sure Patient not sure       Last Vital sign  BP 113/69   Pulse 95   Temp 36.8 C (98.3 F) (Axillary)   Resp 18   Ht 180.3 cm (5' 10.98)   Wt (!) 127 kg (280 lb)   SpO2 99%   BMI 39.07 kg/m    Procedures  PICC line  Discharge Medications     Your Medication List     START taking these medications    doxycycline  100 MG tablet Commonly known as: VIBRA -TABS Take 1 tablet (100 mg total) by mouth two (2) times a day.       CHANGE how you take these medications    metoPROLOL  tartrate 50 MG tablet Commonly known as: Lopressor  1 tablet (50 mg total)  by G-tube route Three (3) times a day. What changed:  how to take this additional instructions   VITAMIN C 1,000 mg Tber Generic drug: ascorbic acid (vitamin C) Take 1,000 mg by mouth two (2) times a day. What changed: how to take this       CONTINUE taking these medications    apixaban 5 mg Tab Commonly known as: ELIQUIS 1 tablet (5 mg total) by Enteral tube: post-pyloric (duodenum, jejunum) route two (2) times a day.   carbidopa -levodopa  25-100 mg per disintegrating tablet Commonly known as: PARCOPA  2 tablets by G-tube route four (4) times a day. Please place thru G tube with 60ml of water    dilTIAZem 30 MG tablet Commonly  known as: CARDIZEM 1 tablet (30 mg total) by G-tube route Three (3) times a day. Crush and place thru G tube. Hold if blood pressure less than 90 systolic or heart rate less than 60   folic acid  1 MG tablet Commonly known as: FOLVITE  Take 1 tablet (1,000 mcg total) by mouth daily.   furosemide  10 mg/mL solution Commonly known as: LASIX  2 mL (20 mg total) by G-tube route daily. Take 2ml daily   JEVITY 1.5 CAL 0.06 gram-1.5 kcal/mL Liqd Generic drug: Jevity 1.5 (Standard 1.5 Cal w/Fiber) 1,000 mL by Feeding route every other day.   lacosamide  10 mg/mL Soln oral solution Commonly known as: VIMPAT  20 mL (200 mg total) by Enteral tube: gastric route two (2) times a day.   lansoprazole  30 MG disintegrating tablet Commonly known as: PREVACID  SOLUTAB Take 1 tablet (30 mg total) by mouth two (2) times a day. Place thru G tube   Liquacel 16-100 gram-kcal/30 mL Liqd Generic drug: amino acids -protein hydrolys 30 mL by G-tube route every evening.   mupirocin 2 % ointment Commonly known as: BACTROBAN Apply 1 Application topically daily. Apply to affected areas   nystatin 100,000 unit/gram powder Commonly known as: MYCOSTATIN Apply 1 Application topically four (4) times a day.   OSMOLITE 1.5 CAL 0.06 gram-1.5 kcal/mL Liqd Generic drug: Osmolite 1.5 (Standard 1.5 Cal) Take 1,000 mL by mouth every other day.   risperiDONE 1 mg/mL oral solution Commonly known as: RisperDAL Take 2 mL (2 mg total) by mouth two (2) times a day. Place thru G TUBE   valproate 250 mg/5 mL syrup Commonly known as: DEPAKENE  Take 5 mL (250 mg total) by mouth Three (3) times a day. Place thru G tube   zinc 50 mg Tab Take 50 mg by mouth every morning before breakfast.        Pending Test Results   none Pending Labs     Order Current Status   Basic Metabolic Panel Collected (01/21/24 0545)   CBC w/ Differential Collected (01/21/24 0545)   CBC w/ Differential Collected (01/21/24 0545)   Blood Culture  Preliminary result   Blood Culture, Adult Preliminary result       Lab Results    BLOOD Recent Labs    Units 01/16/24 1014 01/17/24 0454 01/19/24 0407  WBC 10*9/L 14.8* 10.3 8.0  HGB g/dL 86.8 86.8 88.3*  HCT % 37.4 38.5 35.2*  PLT 10*9/L 203 211 212   Recent Labs    Units 01/16/24 1014 01/16/24 1213 01/17/24 0454 01/18/24 0455 01/19/24 0407 01/20/24 0610  NA mmol/L 138  --  143 147* 150* 147*  K mmol/L 4.0  --  3.7 3.3* 4.2 3.8  CL mmol/L 99  --  107 109* 112* 109*  CO2 mmol/L 27.7  --  26.7 26.8 28.8 29.0  BUN mg/dL 36*  --  31* 28* 32* 33*  CREATININE mg/dL 8.43*  --  8.67* 8.82 8.88 1.04  GLU mg/dL 837  --  852 853 862 841  CALCIUM mg/dL 9.0  --  8.6 9.0 9.1 8.7  ALBUMIN g/dL 2.5*  --   --   --   --   --   PROT g/dL 8.6*  --   --   --   --   --   BILITOT mg/dL 1.0  --   --   --   --   --   AST U/L 24  --   --   --   --   --   ALT U/L 12  --   --   --   --   --   ALKPHOS U/L 137*  --   --   --   --   --   LACTATE mmol/L  --  1.9  --   --   --   --    Recent Labs    Units 01/16/24 1014 01/16/24 1443 01/16/24 1643  TROPONINI ng/L 8 7 7    No results for input(s): INR, LABPROT, APTT, DDIMER in the last 168 hours. Recent Labs    Units 01/16/24 1014  ETOH mg/dL <3    No results for input(s): HEPAIGM, HEPBSAG, HEPBIGM, HEPCAB, MITOAB in the last 168 hours.  URINE Recent Labs    Units 01/16/24 1014  WBCUA /HPF >25*  NITRITE  Negative  LEUKOCYTESUR  Large*  BACTERIA /HPF Packed*  RBCUA /HPF >25*  BLOODU  Large*  GLUCOSEU  Negative  PROTEINUA  100 mg/dL*  KETONESU  Trace*   Recent Labs    Units 01/16/24 1014  OPIAU  Negative  BENZU  Negative  AMPHU  Negative  COCAU  Negative  CANNAU  Negative  BARBU  Negative    BODY FLUIDS No results for input(s): FTYP1, WBCFLUID, FNEUT, LYMPHSFL, FMONO, EOSFL, RBCFL, CLARITYFLUID, COLORFL in the last 168 hours.  ABG No results for input(s): O2SOUR, FIO2ART, PHART,  PCO2ART, PO2ART, HCO3ART, O2SATART, BEART in the last 72 hours.  Microbiology Results (last day)     Procedure Component Value Date/Time Date/Time   Blood Culture, Adult [[id number]]  (Normal) Collected: 01/20/24 0004   Lab Status: Preliminary result Specimen: Blood from 1 Peripheral Draw Updated: 01/21/24 0030    Blood Culture, Routine No Growth at 24 hours   Urine Culture [[id number]]  (Abnormal)  (Susceptibility) Collected: 01/16/24 1014   Lab Status: Final result Specimen: Urine from Catheterized-In and Out Catheter Updated: 01/20/24 1707    Urine Culture, Comprehensive >100,000 CFU/mL Methicillin resistant Staphylococcus aureus*     >100,000 CFU/mL Mixed Gram Positive Organisms Isolated   Narrative:     Staphylococcus aureus in urine should prompt evaluation for possible bacteremia.   Susceptibility     Methicillin resistant Staphylococcus aureus (1)     Antibiotic Interpretation Microscan Method Status   Nafcillin Resistant  KIRBY BAUER Final    Methicillin-resistant staphylococci are resistant to all currently available beta-lactam antibiotics EXCEPT ceftaroline.Contact Micro lab at [phone] to request ceftaroline susceptibility testing.      Doxycycline  Susceptible  KIRBY BAUER Final   Gentamicin Susceptible  KIRBY BAUER Final    Gentamicin is used only in combination with other active agents that test susceptible      Nitrofurantoin Susceptible  KIRBY BAUER Final   Trimethoprim + Sulfamethoxazole Resistant  KIRBY BAUER Final   Vancomycin   Susceptible 2 MIC SUSCEPTIBILITY RESULT Final   Fluoroquinolone No Interpretation  KIRBY BAUER Final    Fluoroquinolones are not indicated for the treatment of staphylococcal infections, including MRSA.                   Blood Culture [[id number]]  (Normal) Collected: 01/16/24 1100   Lab Status: Preliminary result Specimen: Blood from 1 Peripheral Draw Updated: 01/20/24 1115    Blood Culture, Routine No Growth at 4 days    Blood Culture [[id number]]  (Abnormal) Collected: 01/16/24 1010   Lab Status: Final result Specimen: Blood from 1 Peripheral Draw Updated: 01/20/24 1023    Blood Culture, Routine Staphylococcus hominis*    Comment: This organism is a coagulase-negative Staphylococcus species. Susceptibility Testing By Consultation Only      Gram Stain Result Gram positive cocci in clusters       Imaging   XR Chest Portable Result Date: 01/18/2024 CLINICAL DATA:  PICC line placement. EXAM: PORTABLE CHEST 1 VIEW COMPARISON:  01/16/2024 FINDINGS: Right arm PICC line is now seen with tip overlying the superior cavoatrial junction. No pneumothorax visualized. Heart size remains normal. Stable mild left pleural thickening. The lungs are otherwise clear. Thoracic posterior spinal fixation rods again noted.   Right arm PICC line tip overlies the superior cavoatrial junction. No other acute findings. Electronically Signed   By: Norleen DELENA Kil M.D.   On: 01/18/2024 11:24   XR Chest Portable Result Date: 01/16/2024 CLINICAL DATA:  Cough EXAM: PORTABLE CHEST 1 VIEW COMPARISON:  05/07/2017 FINDINGS: Heart size appears mildly enlarged. Aortic atherosclerosis. No focal airspace consolidation, pleural effusion, or pneumothorax. Thoracolumbar fusion hardware.   No active disease. Electronically Signed   By: Mabel Converse D.O.   On: 01/16/2024 12:25   CT Head Wo Contrast Result Date: 01/16/2024 CLINICAL DATA:  78 year old male with history of altered mental status. EXAM: CT HEAD WITHOUT CONTRAST TECHNIQUE: Contiguous axial images were obtained from the base of the skull through the vertex without intravenous contrast. RADIATION DOSE REDUCTION: This exam was performed according to the departmental dose-optimization program which includes automated exposure control, adjustment of the mA and/or kV according to patient size and/or use of iterative reconstruction technique. COMPARISON:  Head CT 04/02/2023. FINDINGS: Brain: Moderate  cerebral and mild cerebellar atrophy. Patchy and confluent areas of decreased attenuation are noted throughout the deep and periventricular white matter of the cerebral hemispheres bilaterally, compatible with chronic microvascular ischemic disease. No evidence of acute infarction, hemorrhage, hydrocephalus, extra-axial collection or mass lesion/mass effect. Vascular: No hyperdense vessel or unexpected calcification. Skull: Normal. Negative for fracture or focal lesion. Sinuses/Orbits: No acute finding. Other: None.   1. No acute intracranial abnormalities. 2. Moderate cerebral and mild cerebellar atrophy with extensive chronic microvascular ischemic changes in the cerebral white matter, as above. Electronically Signed   By: Toribio Aye M.D.   On: 01/16/2024 10:56    Discharge Instructions   Follow Up instructions and Outpatient Referrals    Ambulatory Referral to Home Health     Disciplines requested:  Nursing Physical Therapy     Nursing requested: Teaching/skilled observation and assessment   What teaching is needed (new diagnosis? new medications?): CHF,  fluid  and Na intake, PEG TUBE FEEDINGS, ALREADY ESTABLISHED   Physical Therapy requested:  Home safety evaluation Transfer training Evaluate and treat Weight bearing status Strengthening exercises Ambulation training     Weight Bearing Status (please provide detail): Mod Assist with transfers  and mobility   Physician to follow  patient's care: Referring Provider   Requested start of care date: Routine (within 48 hours)   Reason for referral: decreased activity, weakness and need for monitoring  of intake       Jerel KANDICE Sieving, MD        *Some images could not be shown.

## 2024-01-22 DIAGNOSIS — Z515 Encounter for palliative care: Secondary | ICD-10-CM | POA: Diagnosis not present

## 2024-01-22 DIAGNOSIS — G20C Parkinsonism, unspecified: Secondary | ICD-10-CM | POA: Diagnosis not present

## 2024-01-22 NOTE — Care Plan (Signed)
 Transition of Care Encounter Data   Call attempt: 1 Admission date: 01/16/24 Discharge date: 01/21/24 Discharge diagnosis: Sepsis with acute renal failure Patient post discharge: Since your discharge, are your symptoms better, worse, or the same?: Better Medications:      SABRA   [facility]: [phone]:  .  [facility]: [phone]:  .  Other: Contact PCP:        [facility] TRANSITIONAL CASE MANAGEMENT SUMMARY NOTE   Attempted to contact patient today at Home to complete Transitional Case Management call from [facility] Patient not available; 1st attempt.            Cory DELENA Lower, RN

## 2024-01-24 DIAGNOSIS — R0689 Other abnormalities of breathing: Secondary | ICD-10-CM | POA: Diagnosis not present

## 2024-01-24 DIAGNOSIS — R404 Transient alteration of awareness: Secondary | ICD-10-CM | POA: Diagnosis not present

## 2024-01-24 DIAGNOSIS — R069 Unspecified abnormalities of breathing: Secondary | ICD-10-CM | POA: Diagnosis not present

## 2024-01-24 DIAGNOSIS — R0902 Hypoxemia: Secondary | ICD-10-CM | POA: Diagnosis not present

## 2024-01-24 DIAGNOSIS — R000 Tachycardia, unspecified: Secondary | ICD-10-CM | POA: Diagnosis not present

## 2024-01-24 NOTE — ED Triage Notes (Signed)
 BIBEMS AMS, [facility].

## 2024-01-24 NOTE — Care Plan (Signed)
 Transition of Care Encounter Data   Call attempt: 2 Admission date: 01/16/24 Discharge date: 01/21/24 Discharge diagnosis: Sepsis with acute renal failure Patient post discharge: Medications:      SABRA   [facility]: [phone]:  .  [facility]: [phone]:  .  Other: Contact PCP:          [facility] TRANSITIONS OF CARE SUMMARY NOTE   Attempted to contact pt today at Home to complete Transitions of Care call for the [facility]. No answer/unable to leave message; 2nd attempt  Roma Sharps, RN

## 2024-02-17 ENCOUNTER — Ambulatory Visit (INDEPENDENT_AMBULATORY_CARE_PROVIDER_SITE_OTHER): Payer: No Typology Code available for payment source | Admitting: Urology

## 2024-02-17 VITALS — BP 105/97 | HR 90

## 2024-02-17 DIAGNOSIS — L929 Granulomatous disorder of the skin and subcutaneous tissue, unspecified: Secondary | ICD-10-CM | POA: Diagnosis not present

## 2024-02-17 DIAGNOSIS — N319 Neuromuscular dysfunction of bladder, unspecified: Secondary | ICD-10-CM | POA: Diagnosis not present

## 2024-02-17 MED ORDER — CIPROFLOXACIN HCL 500 MG PO TABS
500.0000 mg | ORAL_TABLET | Freq: Once | ORAL | Status: AC
  Administered 2024-02-17: 500 mg via ORAL

## 2024-02-17 NOTE — Progress Notes (Signed)
 02/17/2024 3:37 PM   Cory Pacheco 1946-10-29 [id number]  Referring provider: Richardean Chimera, MD [address] [place],  [place] [address]  Bleeding from SP site   HPI: Mr Cory Pacheco is a 77yo here for followup for neurogenic bladder and bleeding for SP tube site. He has monthly SP tube changed without incident. For the past 2 months he has had bleeding from the Sp tube site. He has required silver nitrate in the past.    PMH: Past Medical History:  Diagnosis Date   Anxiety    Arthritis    Depression    Hypertension    Seizure [facility])     Surgical History: Past Surgical History:  Procedure Laterality Date   CHOLECYSTECTOMY     COLONOSCOPY     COLONOSCOPY N/A 08/03/2013   Procedure: COLONOSCOPY;  Surgeon: Malissa Hippo, MD;  Location: [facility];  Service: Endoscopy;  Laterality: N/A;  930   TONSILLECTOMY      Home Medications:  Allergies as of 02/17/2024       Reactions   Penicillins Hives   Bupropion Other (See Comments)   Patient not sure   Metformin Other (See Comments)   Patient not sure   Sulfa Antibiotics Other (See Comments)   Patient not sure        Medication List        Accurate as of February 17, 2024  3:37 PM. If you have any questions, ask your nurse or doctor.          acetaminophen 160 MG/5ML elixir Commonly known as: TYLENOL Take 650 mg by mouth every 4 (four) hours as needed for fever.   acetic acid 0.25 % irrigation Irrigate with as directed 2 (two) times daily. Instill 60cc twice a day through suprapubic tube to keep catheter patent   albuterol 108 (90 Base) MCG/ACT inhaler Commonly known as: VENTOLIN HFA Inhale 2 puffs into the lungs every 6 (six) hours as needed for wheezing or shortness of breath.   AMBULATORY NON FORMULARY MEDICATION Home health nurse may exchange 22 french suprapubic catheter (SP tube) at home as needed.   apixaban 5 MG Tabs tablet Commonly known as: ELIQUIS 5 mg by Does not apply route 2 (two) times  daily.   benzonatate 200 MG capsule Commonly known as: TESSALON Take 200 mg by mouth 3 (three) times daily as needed.   carbidopa-levodopa 25-100 MG disintegrating tablet Commonly known as: PARCOPA Take 2 tablets by mouth 4 (four) times daily.   clotrimazole 1 % cream Commonly known as: LOTRIMIN Apply 1 application. topically 2 (two) times daily.   diltiazem 30 MG tablet Commonly known as: CARDIZEM Take 30 mg by mouth 3 (three) times daily.   DEPAKOTE PO 5 mLs by Feeding Tube route 3 (three) times daily. 250/5 mls   divalproex 500 MG DR tablet Commonly known as: Depakote Take 1 tablet (500 mg total) by mouth 3 (three) times daily.   fluticasone 50 MCG/ACT nasal spray Commonly known as: FLONASE Place 2 sprays into both nostrils daily.   folic acid 1 MG tablet Commonly known as: FOLVITE Take 2 mg by mouth daily.   furosemide 10 MG/ML solution Commonly known as: LASIX Place 20 mg into feeding tube daily.   lacosamide 10 MG/ML oral solution Commonly known as: VIMPAT Take by mouth See admin instructions. 20 ml by mouth twice a day   lansoprazole 30 MG disintegrating tablet Commonly known as: PREVACID SOLUTAB Take 30 mg by mouth 2 (  two) times daily.   metoprolol tartrate 50 MG tablet Commonly known as: LOPRESSOR Take 1 tablet (50 mg total) by mouth 3 (three) times daily.   nystatin powder Commonly known as: MYCOSTATIN/NYSTOP Apply 1 application topically 3 (three) times daily.   protein supplement Liqd 30 mLs.   feeding supplement (OSMOLITE 1.2 CAL) Liqd Place into feeding tube daily. 2 quarts every day   risperiDONE 1 MG/ML oral solution Commonly known as: RISPERDAL Take by mouth See admin instructions. Give 1 and 1/2 ml s by route of feeding tube twice a day.   vitamin C 1000 MG tablet Take 1,000 mg by mouth daily.   Vitamin D3 50 MCG (2000 UT) Tabs Take 1 tablet by mouth daily.   zinc sulfate (50mg  elemental zinc) 220 (50 Zn) MG capsule Take 220 mg  by mouth daily.        Allergies:  Allergies  Allergen Reactions   Penicillins Hives   Bupropion Other (See Comments)    Patient not sure   Metformin Other (See Comments)    Patient not sure   Sulfa Antibiotics Other (See Comments)    Patient not sure    Family History: Family History  Problem Relation Age of Onset   Cancer Sister    Sudden death Neg Hx    Sickle cell trait Neg Hx    Lupus Neg Hx    Anesthesia problems Neg Hx     Social History:  reports that he has never smoked. He has never used smokeless tobacco. He reports that he does not drink alcohol and does not use drugs.  ROS: All other review of systems were reviewed and are negative except what is noted above in HPI  Physical Exam: There were no vitals taken for this visit.  Constitutional:  Alert and oriented, No acute distress. HEENT: [place] AT, moist mucus membranes.  Trachea midline, no masses. Cardiovascular: No clubbing, cyanosis, or edema. Respiratory: Normal respiratory effort, no increased work of breathing. GI: Abdomen is soft, nontender, nondistended, no abdominal masses GU: No CVA tenderness.  Lymph: No cervical or inguinal lymphadenopathy. Skin: No rashes, bruises or suspicious lesions. Neurologic: Grossly intact, patient in wheelchair. Psychiatric: Normal mood and affect.  Laboratory Data: Lab Results  Component Value Date   WBC 8.6 01/01/2022   HGB 14.6 01/01/2022   HCT 43.6 01/01/2022   MCV 102.1 (H) 01/01/2022   PLT 162 01/01/2022    Lab Results  Component Value Date   CREATININE 0.80 02/17/2023    No results found for: "PSA"  No results found for: "TESTOSTERONE"  No results found for: "HGBA1C"  Urinalysis    Component Value Date/Time   COLORURINE AMBER (A) 05/28/2021 1631   APPEARANCEUR Turbid (A) 01/08/2023 1511   LABSPEC 1.008 05/28/2021 1631   PHURINE 7.0 05/28/2021 1631   GLUCOSEU Negative 01/08/2023 1511   HGBUR MODERATE (A) 05/28/2021 1631   BILIRUBINUR  Negative 01/08/2023 1511   KETONESUR NEGATIVE 05/28/2021 1631   PROTEINUR 3+ (A) 01/08/2023 1511   PROTEINUR 100 (A) 05/28/2021 1631   NITRITE Positive (A) 01/08/2023 1511   NITRITE NEGATIVE 05/28/2021 1631   LEUKOCYTESUR 3+ (A) 01/08/2023 1511   LEUKOCYTESUR MODERATE (A) 05/28/2021 1631    Lab Results  Component Value Date   LABMICR See below: 01/08/2023   WBCUA >30 (A) 01/08/2023   LABEPIT 0-10 01/08/2023   MUCUS Present 05/15/2022   BACTERIA Many (A) 01/08/2023    Pertinent Imaging:  No results found for this or any previous visit.  No results found for this or any previous visit.  No results found for this or any previous visit.  No results found for this or any previous visit.  No results found for this or any previous visit.  No results found for this or any previous visit.  Results for orders placed in visit on 01/14/23  CT HEMATURIA WORKUP  Narrative CLINICAL DATA:  Blood in urine.  Infection.  EXAM: CT ABDOMEN AND PELVIS WITHOUT AND WITH CONTRAST  TECHNIQUE: Multidetector CT imaging of the abdomen and pelvis was performed following the standard protocol before and following the bolus administration of intravenous contrast.  RADIATION DOSE REDUCTION: This exam was performed according to the departmental dose-optimization program which includes automated exposure control, adjustment of the mA and/or kV according to patient size and/or use of iterative reconstruction technique.  CONTRAST:  OMNIPAQUE IOHEXOL 300 MG/ML  SOLN  COMPARISON:  None Available.  FINDINGS: Lower chest: There is some linear opacity lung bases likely scar or atelectasis. No pleural effusion on the right tiny left effusion some pleural thickening. Coronary artery calcifications are seen.  Hepatobiliary: Slight nodular contour of the liver. No space-occupying liver lesion separate tiny peripheral cyst measuring 4 mm in the right hepatic lobe on series 7, image 21. No  specific imaging follow up. Patent portal vein. Previous cholecystectomy.  Pancreas: Mild pancreatic atrophy. No obvious mass or ductal dilatation.  Spleen: Normal in size without focal abnormality.  Adrenals/Urinary Tract: The adrenal glands are preserved. No left-sided renal or ureteral stone. No right-sided ureteral stone. There are some intrarenal nonobstructing stones on the right side. The larger focus measures 7 mm. Two right-sided renal stones total. No enhancing renal masses. There is a simple cyst seen along each kidney. On the left there is posterior lesion measuring 4.5 cm in maximum transverse dimension with Hounsfield unit of near 0. Right-sided focus has a similar Hounsfield unit and diameter of 2.7 cm. No collecting system dilatation. Calices are delicately cupped. The ureters have normal course and caliber down to the bladder. There is suprapubic catheter in place. The bladder itself is underdistended with wall thickening and some dependent air. Please correlate for any clinical evidence of cystitis. Slight adjacent stranding.  Stomach/Bowel: There is a redundant course of the sigmoid colon extending into the upper abdomen. Overall large diffuse colonic stool. The large bowel is nondilated. Normal appendix in the right lower quadrant. There is a G-tube in the stomach. Stomach is nondilated. Small bowel is nondilated.  Vascular/Lymphatic: Normal caliber aorta and IVC. Diffuse vascular calcifications along the aorta. Some along branch vessels. Circumaortic left renal vein. No specific abnormal lymph node enlargement is seen in the abdomen and pelvis.  Reproductive: Prominent prostate.  Atrophic seminal vesicles.  Other: No ascites.  Mild anasarca.  Diffuse muscle atrophy.  Musculoskeletal: Diffuse syndesmophytes along the spine with fixation hardware in ankylosis. Osteopenia. Degenerative changes seen throughout the pelvis also with areas of ankylosis.  Please correlate with clinical history.  IMPRESSION: Two nonobstructing right-sided renal stones. Bilateral simple appearing renal cysts. No enhancing mass or collecting system dilatation.  Suprapubic catheter in place with wall thickening and slight stranding. Please correlate for any clinical evidence of cystitis.  Diffuse large amount of colonic stool without obstruction.  G-tube.   Electronically Signed By: Karen Kays M.D. On: 02/18/2023 17:22  No results found for this or any previous visit.   Assessment & Plan:    1. Neurogenic bladder (Primary) -continue monthly SP changes - ciprofloxacin (CIPRO)  tablet 500 mg - PR CHANGE CYSTOSTOMY TUBE SIMPLE  2. Granulation tissue Silver nitrate applied to SP tube site.   No follow-ups on file.  Wilkie Aye, MD  [facility]

## 2024-02-17 NOTE — Progress Notes (Addendum)
 Suprapubic Cath Change  Patient is present today for a suprapubic catheter change due to urinary retention.  10ml of water was drained from the balloon, a 22FR silicone foley cath was removed from the tract with out difficulty.  Suprapubic catheter site was cleaned and prepped in a sterile fashion with [id number]  A 22FR silicone foley cath was replaced into the tract no complications were noted. Urine return was noted, urine Clear yellow and Bloody in color . 10 ml of sterile water was inflated into the balloon and a bed bag was attached for drainage.  Patient tolerated well. A night bag was given to patient and proper instruction was given on how to switch bags.    Performed by: Guss Bunde, CMA  Follow up: 4 weeks

## 2024-02-18 ENCOUNTER — Other Ambulatory Visit: Payer: No Typology Code available for payment source | Admitting: Urology

## 2024-02-19 DIAGNOSIS — G20C Parkinsonism, unspecified: Secondary | ICD-10-CM | POA: Diagnosis not present

## 2024-02-19 DIAGNOSIS — Z515 Encounter for palliative care: Secondary | ICD-10-CM | POA: Diagnosis not present

## 2024-02-22 ENCOUNTER — Other Ambulatory Visit: Payer: No Typology Code available for payment source | Admitting: Urology

## 2024-02-23 ENCOUNTER — Encounter: Payer: Self-pay | Admitting: Urology

## 2024-02-23 DIAGNOSIS — R600 Localized edema: Secondary | ICD-10-CM | POA: Diagnosis not present

## 2024-02-23 DIAGNOSIS — Z882 Allergy status to sulfonamides status: Secondary | ICD-10-CM | POA: Diagnosis not present

## 2024-02-23 DIAGNOSIS — Z4682 Encounter for fitting and adjustment of non-vascular catheter: Secondary | ICD-10-CM | POA: Diagnosis not present

## 2024-02-23 DIAGNOSIS — K219 Gastro-esophageal reflux disease without esophagitis: Secondary | ICD-10-CM | POA: Diagnosis not present

## 2024-02-23 DIAGNOSIS — J90 Pleural effusion, not elsewhere classified: Secondary | ICD-10-CM | POA: Diagnosis not present

## 2024-02-23 DIAGNOSIS — R059 Cough, unspecified: Secondary | ICD-10-CM | POA: Diagnosis not present

## 2024-02-23 DIAGNOSIS — A419 Sepsis, unspecified organism: Secondary | ICD-10-CM | POA: Diagnosis not present

## 2024-02-23 DIAGNOSIS — Z888 Allergy status to other drugs, medicaments and biological substances status: Secondary | ICD-10-CM | POA: Diagnosis not present

## 2024-02-23 DIAGNOSIS — R918 Other nonspecific abnormal finding of lung field: Secondary | ICD-10-CM | POA: Diagnosis not present

## 2024-02-23 DIAGNOSIS — R404 Transient alteration of awareness: Secondary | ICD-10-CM | POA: Diagnosis not present

## 2024-02-23 DIAGNOSIS — I2510 Atherosclerotic heart disease of native coronary artery without angina pectoris: Secondary | ICD-10-CM | POA: Diagnosis not present

## 2024-02-23 DIAGNOSIS — R1084 Generalized abdominal pain: Secondary | ICD-10-CM | POA: Diagnosis not present

## 2024-02-23 DIAGNOSIS — G4733 Obstructive sleep apnea (adult) (pediatric): Secondary | ICD-10-CM | POA: Diagnosis not present

## 2024-02-23 DIAGNOSIS — R55 Syncope and collapse: Secondary | ICD-10-CM | POA: Diagnosis not present

## 2024-02-23 DIAGNOSIS — I5032 Chronic diastolic (congestive) heart failure: Secondary | ICD-10-CM | POA: Diagnosis not present

## 2024-02-23 DIAGNOSIS — G20A1 Parkinson's disease without dyskinesia, without mention of fluctuations: Secondary | ICD-10-CM | POA: Diagnosis not present

## 2024-02-23 DIAGNOSIS — J690 Pneumonitis due to inhalation of food and vomit: Secondary | ICD-10-CM | POA: Diagnosis not present

## 2024-02-23 DIAGNOSIS — Z7901 Long term (current) use of anticoagulants: Secondary | ICD-10-CM | POA: Diagnosis not present

## 2024-02-23 DIAGNOSIS — J811 Chronic pulmonary edema: Secondary | ICD-10-CM | POA: Diagnosis not present

## 2024-02-23 DIAGNOSIS — R0902 Hypoxemia: Secondary | ICD-10-CM | POA: Diagnosis not present

## 2024-02-23 DIAGNOSIS — Z880 Allergy status to penicillin: Secondary | ICD-10-CM | POA: Diagnosis not present

## 2024-02-23 DIAGNOSIS — J9600 Acute respiratory failure, unspecified whether with hypoxia or hypercapnia: Secondary | ICD-10-CM | POA: Diagnosis not present

## 2024-02-23 DIAGNOSIS — K56609 Unspecified intestinal obstruction, unspecified as to partial versus complete obstruction: Secondary | ICD-10-CM | POA: Diagnosis not present

## 2024-02-23 DIAGNOSIS — Z79899 Other long term (current) drug therapy: Secondary | ICD-10-CM | POA: Diagnosis not present

## 2024-02-23 DIAGNOSIS — N281 Cyst of kidney, acquired: Secondary | ICD-10-CM | POA: Diagnosis not present

## 2024-02-23 DIAGNOSIS — I110 Hypertensive heart disease with heart failure: Secondary | ICD-10-CM | POA: Diagnosis not present

## 2024-02-23 DIAGNOSIS — I4891 Unspecified atrial fibrillation: Secondary | ICD-10-CM | POA: Diagnosis not present

## 2024-02-23 NOTE — Patient Instructions (Signed)
 Nerve Problems With the Bladder (Neurogenic Bladder): What to Know  Neurogenic bladder is a problem with the nerves that help you pee. Your brain sends signals to the nerves and muscles in your bladder to start and stop the flow of pee. If you have neurogenic bladder, these nerves and muscles don't work the way they should. This may mean that your bladder is: Overactive. This means you have trouble holding your pee. Underactive. This means you have trouble peeing. What are the causes? Nerve problems with your bladder may be caused by nerve damage or by a condition that changes the signals from your brain to your bladder. Many things can cause this. They include: A disease, such as: Cerebral palsy. Multiple sclerosis. Parkinson's disease. Damage to your brain or spinal cord. This can come from: Tumors. These are growths of cells that aren't normal. An infection. Surgery. Drinking a lot of alcohol. A stroke. What increases the risk? You're more likely to get neurogenic bladder if you have: Nerve damage. A nerve disease that you were born with. What are the signs or symptoms? Leaking or gushing pee. A sudden, strong need to pee. Peeing a lot during the day and night. Not being able to pee fully. A lot of urinary tract infections (UTIs). How is this diagnosed? You may be diagnosed based on your symptoms, medical history, and an exam. You may be asked to keep a log of your symptoms and when you pee. You may also have tests. These may include: A test of your pee to check for an infection. A scan of your bladder. This checks how much pee is left in your bladder after you pee. Tests to check the flow of your pee. These are called urodynamic tests. A test to look at your bladder with a camera. Imaging tests of your brain or spine, such as: An MRI. A CT scan. You may also need to see an expert in treating problems with the bladder called a urologist. How is this treated? Treatment  depends on what caused the nerve problems and what symptoms you have. Work closely with your health care provider to find the best treatments for you. They may include: Learning ways to control when you pee, such as: Peeing at set times. Training yourself to delay peeing. Doing exercises to make the muscles in your bladder stronger. These are called Kegel exercises. Staying away from foods and drinks that make your symptoms worse. Taking medicines to: Make an underactive bladder work. Calm an overactive bladder. Treat a UTI. Using a tube called a catheter to empty your bladder. Having surgery to help the nerves that control your bladder. Follow these instructions at home: Lifestyle Keep a diary of what foods, drinks, and other things make your symptoms worse. Use your diary to set times that you'll try to pee. If you're away from home, plan to be near a bathroom when needed. Limit drinks that can make you pee. These include soda, coffee, and tea. After you pee, wait a minute and try again. Make sure you pee just before you leave the house and just before you go to bed. Kegel exercises Do Kegel exercises to help make your bladder muscles stronger. These are the muscles that you use to try to hold it when you need to pee. To do the exercises: Squeeze the muscles tight, as if you're trying to stop peeing. You should feel your muscles lift. If you're male, you may also feel a lift in the area around  your vagina. Keep your belly, butt, and legs relaxed. Hold the muscles tight for 5-10 seconds. Relax your muscles for 5-10 seconds. Repeat 10 times. Do this exercise 3 times a day or as many times as told. General instructions Take your medicines only as told. Contact a health care provider if: Your symptoms don't get better. Your symptoms get worse. You have signs of a UTI. These may include: A burning feeling when you pee. Fever or chills. Cloudy or bloody pee. Get help right away  if: You can't pee. This information is not intended to replace advice given to you by your health care provider. Make sure you discuss any questions you have with your health care provider. Document Revised: 06/29/2023 Document Reviewed: 06/29/2023 Elsevier Patient Education  2024 [employer].

## 2024-02-24 ENCOUNTER — Other Ambulatory Visit: Payer: No Typology Code available for payment source | Admitting: Urology

## 2024-03-08 ENCOUNTER — Ambulatory Visit: Payer: No Typology Code available for payment source

## 2024-03-22 ENCOUNTER — Other Ambulatory Visit (HOSPITAL_COMMUNITY): Payer: Self-pay

## 2024-03-22 ENCOUNTER — Inpatient Hospital Stay
Admission: EM | Admit: 2024-03-22 | Discharge: 2024-04-01 | Disposition: A | Discharge status: Nursing Home (Includes ICF) | Payer: Self-pay | Source: Intra-hospital | Attending: Internal Medicine | Admitting: Internal Medicine

## 2024-03-22 DIAGNOSIS — J9601 Acute respiratory failure with hypoxia: Secondary | ICD-10-CM | POA: Diagnosis not present

## 2024-03-22 LAB — BLOOD GAS, ARTERIAL
Acid-Base Excess: 9.6 mmol/L — ABNORMAL HIGH (ref 0.0–2.0)
Bicarbonate: 32.1 mmol/L — ABNORMAL HIGH (ref 20.0–28.0)
O2 Saturation: 99.7 %
Patient temperature: 37
pCO2 arterial: 35 mmHg (ref 32–48)
pH, Arterial: 7.57 — ABNORMAL HIGH (ref 7.35–7.45)
pO2, Arterial: 128 mmHg — ABNORMAL HIGH (ref 83–108)

## 2024-03-22 MED ORDER — DIATRIZOATE MEGLUMINE & SODIUM 66-10 % PO SOLN: 30.0000 mL | Freq: Once | ORAL | Status: DC

## 2024-03-23 DIAGNOSIS — J159 Unspecified bacterial pneumonia: Secondary | ICD-10-CM

## 2024-03-23 DIAGNOSIS — I482 Chronic atrial fibrillation, unspecified: Secondary | ICD-10-CM | POA: Diagnosis not present

## 2024-03-23 DIAGNOSIS — J9621 Acute and chronic respiratory failure with hypoxia: Secondary | ICD-10-CM | POA: Diagnosis not present

## 2024-03-23 DIAGNOSIS — Y95 Nosocomial condition: Secondary | ICD-10-CM

## 2024-03-23 DIAGNOSIS — G20B2 Parkinson's disease with dyskinesia, with fluctuations: Secondary | ICD-10-CM | POA: Diagnosis not present

## 2024-03-23 DIAGNOSIS — Z930 Tracheostomy status: Secondary | ICD-10-CM | POA: Diagnosis not present

## 2024-03-23 LAB — CBC
HCT: 32.6 % — ABNORMAL LOW (ref 39.0–52.0)
Hemoglobin: 10.2 g/dL — ABNORMAL LOW (ref 13.0–17.0)
MCH: 35.8 pg — ABNORMAL HIGH (ref 26.0–34.0)
MCHC: 31.3 g/dL (ref 30.0–36.0)
MCV: 114.4 fL — ABNORMAL HIGH (ref 80.0–100.0)
Platelets: 357 10*3/uL (ref 150–400)
RBC: 2.85 MIL/uL — ABNORMAL LOW (ref 4.22–5.81)
RDW: 16.9 % — ABNORMAL HIGH (ref 11.5–15.5)
WBC: 8.2 10*3/uL (ref 4.0–10.5)
nRBC: 0 % (ref 0.0–0.2)

## 2024-03-23 LAB — BASIC METABOLIC PANEL WITH GFR
Anion gap: 12 (ref 5–15)
BUN: 41 mg/dL — ABNORMAL HIGH (ref 8–23)
CO2: 28 mmol/L (ref 22–32)
Calcium: 9 mg/dL (ref 8.9–10.3)
Chloride: 104 mmol/L (ref 98–111)
Creatinine, Ser: 0.97 mg/dL (ref 0.61–1.24)
GFR, Estimated: >60 mL/min (ref >60)
Glucose, Bld: 149 mg/dL — ABNORMAL HIGH (ref 70–99)
Potassium: 3.7 mmol/L (ref 3.5–5.1)
Sodium: 144 mmol/L (ref 135–145)

## 2024-03-24 DIAGNOSIS — J9621 Acute and chronic respiratory failure with hypoxia: Secondary | ICD-10-CM | POA: Diagnosis not present

## 2024-03-24 DIAGNOSIS — Z930 Tracheostomy status: Secondary | ICD-10-CM | POA: Diagnosis not present

## 2024-03-24 DIAGNOSIS — Y95 Nosocomial condition: Secondary | ICD-10-CM

## 2024-03-24 DIAGNOSIS — I482 Chronic atrial fibrillation, unspecified: Secondary | ICD-10-CM | POA: Diagnosis not present

## 2024-03-24 DIAGNOSIS — G20B2 Parkinson's disease with dyskinesia, with fluctuations: Secondary | ICD-10-CM | POA: Diagnosis not present

## 2024-03-24 DIAGNOSIS — J159 Unspecified bacterial pneumonia: Secondary | ICD-10-CM

## 2024-03-25 DIAGNOSIS — G20B2 Parkinson's disease with dyskinesia, with fluctuations: Secondary | ICD-10-CM

## 2024-03-25 DIAGNOSIS — I482 Chronic atrial fibrillation, unspecified: Secondary | ICD-10-CM

## 2024-03-25 DIAGNOSIS — J9621 Acute and chronic respiratory failure with hypoxia: Secondary | ICD-10-CM

## 2024-03-25 DIAGNOSIS — Y95 Nosocomial condition: Secondary | ICD-10-CM

## 2024-03-25 DIAGNOSIS — J159 Unspecified bacterial pneumonia: Secondary | ICD-10-CM

## 2024-03-25 DIAGNOSIS — Z930 Tracheostomy status: Secondary | ICD-10-CM

## 2024-03-26 DIAGNOSIS — Z930 Tracheostomy status: Secondary | ICD-10-CM | POA: Diagnosis not present

## 2024-03-26 DIAGNOSIS — J159 Unspecified bacterial pneumonia: Secondary | ICD-10-CM

## 2024-03-26 DIAGNOSIS — G20B2 Parkinson's disease with dyskinesia, with fluctuations: Secondary | ICD-10-CM | POA: Diagnosis not present

## 2024-03-26 DIAGNOSIS — J9621 Acute and chronic respiratory failure with hypoxia: Secondary | ICD-10-CM | POA: Diagnosis not present

## 2024-03-26 DIAGNOSIS — Y95 Nosocomial condition: Secondary | ICD-10-CM

## 2024-03-26 DIAGNOSIS — I482 Chronic atrial fibrillation, unspecified: Secondary | ICD-10-CM | POA: Diagnosis not present

## 2024-03-26 LAB — BASIC METABOLIC PANEL WITH GFR
Anion gap: 11 (ref 5–15)
BUN: 37 mg/dL — ABNORMAL HIGH (ref 8–23)
CO2: 29 mmol/L (ref 22–32)
Calcium: 9.2 mg/dL (ref 8.9–10.3)
Chloride: 104 mmol/L (ref 98–111)
Creatinine, Ser: 0.91 mg/dL (ref 0.61–1.24)
GFR, Estimated: >60 mL/min (ref >60)
Glucose, Bld: 115 mg/dL — ABNORMAL HIGH (ref 70–99)
Potassium: 3.4 mmol/L — ABNORMAL LOW (ref 3.5–5.1)
Sodium: 144 mmol/L (ref 135–145)

## 2024-03-26 LAB — MAGNESIUM: Magnesium: 2.5 mg/dL — ABNORMAL HIGH (ref 1.7–2.4)

## 2024-03-26 LAB — CBC
HCT: 32.6 % — ABNORMAL LOW (ref 39.0–52.0)
Hemoglobin: 9.9 g/dL — ABNORMAL LOW (ref 13.0–17.0)
MCH: 34.9 pg — ABNORMAL HIGH (ref 26.0–34.0)
MCHC: 30.4 g/dL (ref 30.0–36.0)
MCV: 114.8 fL — ABNORMAL HIGH (ref 80.0–100.0)
Platelets: 302 10*3/uL (ref 150–400)
RBC: 2.84 MIL/uL — ABNORMAL LOW (ref 4.22–5.81)
RDW: 16.4 % — ABNORMAL HIGH (ref 11.5–15.5)
WBC: 11.5 10*3/uL — ABNORMAL HIGH (ref 4.0–10.5)
nRBC: 0 % (ref 0.0–0.2)

## 2024-03-27 DIAGNOSIS — Y95 Nosocomial condition: Secondary | ICD-10-CM

## 2024-03-27 DIAGNOSIS — Z930 Tracheostomy status: Secondary | ICD-10-CM | POA: Diagnosis not present

## 2024-03-27 DIAGNOSIS — J159 Unspecified bacterial pneumonia: Secondary | ICD-10-CM

## 2024-03-27 DIAGNOSIS — J9621 Acute and chronic respiratory failure with hypoxia: Secondary | ICD-10-CM | POA: Diagnosis not present

## 2024-03-27 DIAGNOSIS — G20B2 Parkinson's disease with dyskinesia, with fluctuations: Secondary | ICD-10-CM | POA: Diagnosis not present

## 2024-03-27 DIAGNOSIS — I482 Chronic atrial fibrillation, unspecified: Secondary | ICD-10-CM | POA: Diagnosis not present

## 2024-03-27 LAB — BASIC METABOLIC PANEL WITH GFR
Anion gap: 13 (ref 5–15)
BUN: 35 mg/dL — ABNORMAL HIGH (ref 8–23)
CO2: 26 mmol/L (ref 22–32)
Calcium: 9 mg/dL (ref 8.9–10.3)
Chloride: 104 mmol/L (ref 98–111)
Creatinine, Ser: 0.82 mg/dL (ref 0.61–1.24)
GFR, Estimated: >60 mL/min (ref >60)
Glucose, Bld: 119 mg/dL — ABNORMAL HIGH (ref 70–99)
Potassium: 3.6 mmol/L (ref 3.5–5.1)
Sodium: 143 mmol/L (ref 135–145)

## 2024-03-27 LAB — CBC
HCT: 30.9 % — ABNORMAL LOW (ref 39.0–52.0)
Hemoglobin: 9.7 g/dL — ABNORMAL LOW (ref 13.0–17.0)
MCH: 35.4 pg — ABNORMAL HIGH (ref 26.0–34.0)
MCHC: 31.4 g/dL (ref 30.0–36.0)
MCV: 112.8 fL — ABNORMAL HIGH (ref 80.0–100.0)
Platelets: 288 10*3/uL (ref 150–400)
RBC: 2.74 MIL/uL — ABNORMAL LOW (ref 4.22–5.81)
RDW: 16.1 % — ABNORMAL HIGH (ref 11.5–15.5)
WBC: 9.6 10*3/uL (ref 4.0–10.5)
nRBC: 0 % (ref 0.0–0.2)

## 2024-03-28 DIAGNOSIS — G20B2 Parkinson's disease with dyskinesia, with fluctuations: Secondary | ICD-10-CM | POA: Diagnosis not present

## 2024-03-28 DIAGNOSIS — I482 Chronic atrial fibrillation, unspecified: Secondary | ICD-10-CM | POA: Diagnosis not present

## 2024-03-28 DIAGNOSIS — Z930 Tracheostomy status: Secondary | ICD-10-CM | POA: Diagnosis not present

## 2024-03-28 DIAGNOSIS — J159 Unspecified bacterial pneumonia: Secondary | ICD-10-CM

## 2024-03-28 DIAGNOSIS — Y95 Nosocomial condition: Secondary | ICD-10-CM

## 2024-03-28 DIAGNOSIS — J9621 Acute and chronic respiratory failure with hypoxia: Secondary | ICD-10-CM | POA: Diagnosis not present

## 2024-03-29 ENCOUNTER — Other Ambulatory Visit (HOSPITAL_COMMUNITY): Payer: Self-pay

## 2024-03-29 ENCOUNTER — Ambulatory Visit

## 2024-03-29 DIAGNOSIS — I482 Chronic atrial fibrillation, unspecified: Secondary | ICD-10-CM | POA: Diagnosis not present

## 2024-03-29 DIAGNOSIS — Z930 Tracheostomy status: Secondary | ICD-10-CM | POA: Diagnosis not present

## 2024-03-29 DIAGNOSIS — J159 Unspecified bacterial pneumonia: Secondary | ICD-10-CM

## 2024-03-29 DIAGNOSIS — J9621 Acute and chronic respiratory failure with hypoxia: Secondary | ICD-10-CM | POA: Diagnosis not present

## 2024-03-29 DIAGNOSIS — G20B2 Parkinson's disease with dyskinesia, with fluctuations: Secondary | ICD-10-CM | POA: Diagnosis not present

## 2024-03-29 DIAGNOSIS — Y95 Nosocomial condition: Secondary | ICD-10-CM

## 2024-03-30 DIAGNOSIS — Z930 Tracheostomy status: Secondary | ICD-10-CM | POA: Diagnosis not present

## 2024-03-30 DIAGNOSIS — Y95 Nosocomial condition: Secondary | ICD-10-CM

## 2024-03-30 DIAGNOSIS — G20B2 Parkinson's disease with dyskinesia, with fluctuations: Secondary | ICD-10-CM | POA: Diagnosis not present

## 2024-03-30 DIAGNOSIS — J159 Unspecified bacterial pneumonia: Secondary | ICD-10-CM

## 2024-03-30 DIAGNOSIS — I482 Chronic atrial fibrillation, unspecified: Secondary | ICD-10-CM | POA: Diagnosis not present

## 2024-03-30 DIAGNOSIS — J9621 Acute and chronic respiratory failure with hypoxia: Secondary | ICD-10-CM | POA: Diagnosis not present

## 2024-03-30 LAB — BASIC METABOLIC PANEL WITH GFR
Anion gap: 7 (ref 5–15)
BUN: 28 mg/dL — ABNORMAL HIGH (ref 8–23)
CO2: 28 mmol/L (ref 22–32)
Calcium: 9 mg/dL (ref 8.9–10.3)
Chloride: 107 mmol/L (ref 98–111)
Creatinine, Ser: 0.87 mg/dL (ref 0.61–1.24)
GFR, Estimated: >60 mL/min (ref >60)
Glucose, Bld: 116 mg/dL — ABNORMAL HIGH (ref 70–99)
Potassium: 4 mmol/L (ref 3.5–5.1)
Sodium: 142 mmol/L (ref 135–145)

## 2024-03-30 LAB — CBC
HCT: 31.9 % — ABNORMAL LOW (ref 39.0–52.0)
Hemoglobin: 10.2 g/dL — ABNORMAL LOW (ref 13.0–17.0)
MCH: 35.4 pg — ABNORMAL HIGH (ref 26.0–34.0)
MCHC: 32 g/dL (ref 30.0–36.0)
MCV: 110.8 fL — ABNORMAL HIGH (ref 80.0–100.0)
Platelets: 258 10*3/uL (ref 150–400)
RBC: 2.88 MIL/uL — ABNORMAL LOW (ref 4.22–5.81)
RDW: 15.8 % — ABNORMAL HIGH (ref 11.5–15.5)
WBC: 9 10*3/uL (ref 4.0–10.5)
nRBC: 0 % (ref 0.0–0.2)

## 2024-03-30 LAB — MAGNESIUM: Magnesium: 2.2 mg/dL (ref 1.7–2.4)

## 2024-03-31 DIAGNOSIS — G20B2 Parkinson's disease with dyskinesia, with fluctuations: Secondary | ICD-10-CM | POA: Diagnosis not present

## 2024-03-31 DIAGNOSIS — I482 Chronic atrial fibrillation, unspecified: Secondary | ICD-10-CM | POA: Diagnosis not present

## 2024-03-31 DIAGNOSIS — J159 Unspecified bacterial pneumonia: Secondary | ICD-10-CM

## 2024-03-31 DIAGNOSIS — J9621 Acute and chronic respiratory failure with hypoxia: Secondary | ICD-10-CM | POA: Diagnosis not present

## 2024-03-31 DIAGNOSIS — Y95 Nosocomial condition: Secondary | ICD-10-CM

## 2024-03-31 DIAGNOSIS — Z930 Tracheostomy status: Secondary | ICD-10-CM | POA: Diagnosis not present

## 2024-04-01 ENCOUNTER — Emergency Department (HOSPITAL_COMMUNITY)

## 2024-04-01 ENCOUNTER — Encounter (HOSPITAL_COMMUNITY): Payer: Self-pay

## 2024-04-01 ENCOUNTER — Emergency Department (HOSPITAL_COMMUNITY)
Admission: EM | Admit: 2024-04-01 | Discharge: 2024-04-01 | Disposition: A | Discharge status: Home / Self Care | Attending: Emergency Medicine | Admitting: Emergency Medicine

## 2024-04-01 ENCOUNTER — Other Ambulatory Visit: Payer: Self-pay

## 2024-04-01 ENCOUNTER — Inpatient Hospital Stay
Admission: AD | Admit: 2024-04-01 | Discharge: 2024-04-02 | Disposition: A | Discharge status: Still In Hospital | Payer: Self-pay | Source: Ambulatory Visit | Attending: Internal Medicine | Admitting: Internal Medicine

## 2024-04-01 DIAGNOSIS — F0390 Unspecified dementia without behavioral disturbance: Secondary | ICD-10-CM | POA: Diagnosis not present

## 2024-04-01 DIAGNOSIS — I110 Hypertensive heart disease with heart failure: Secondary | ICD-10-CM | POA: Diagnosis not present

## 2024-04-01 DIAGNOSIS — G20A1 Parkinson's disease without dyskinesia, without mention of fluctuations: Secondary | ICD-10-CM | POA: Insufficient documentation

## 2024-04-01 DIAGNOSIS — J9503 Malfunction of tracheostomy stoma: Secondary | ICD-10-CM | POA: Insufficient documentation

## 2024-04-01 DIAGNOSIS — I509 Heart failure, unspecified: Secondary | ICD-10-CM | POA: Diagnosis not present

## 2024-04-01 HISTORY — DX: Obstructive sleep apnea (adult) (pediatric): G47.33

## 2024-04-01 HISTORY — DX: Acute kidney failure, unspecified: N17.9

## 2024-04-01 HISTORY — DX: Neuromuscular dysfunction of bladder, unspecified: N31.9

## 2024-04-01 HISTORY — DX: Unspecified atrial fibrillation: I48.91

## 2024-04-01 HISTORY — DX: Heart failure, unspecified: I50.9

## 2024-04-01 HISTORY — DX: Schizophrenia, unspecified: F20.9

## 2024-04-01 HISTORY — DX: Parkinson's disease without dyskinesia, without mention of fluctuations: G20.A1

## 2024-04-01 LAB — I-STAT ARTERIAL BLOOD GAS, ED
Acid-Base Excess: 1 mmol/L (ref 0.0–2.0)
Acid-Base Excess: 3 mmol/L — ABNORMAL HIGH (ref 0.0–2.0)
Bicarbonate: 32.5 mmol/L — ABNORMAL HIGH (ref 20.0–28.0)
Bicarbonate: 26.4 mmol/L (ref 20.0–28.0)
Calcium, Ion: 1.34 mmol/L (ref 1.15–1.40)
Calcium, Ion: 1.26 mmol/L (ref 1.15–1.40)
HCT: 37 % — ABNORMAL LOW (ref 39.0–52.0)
HCT: 31 % — ABNORMAL LOW (ref 39.0–52.0)
Hemoglobin: 12.6 g/dL — ABNORMAL LOW (ref 13.0–17.0)
Hemoglobin: 10.5 g/dL — ABNORMAL LOW (ref 13.0–17.0)
O2 Saturation: 100 %
O2 Saturation: 100 %
Patient temperature: 36.3
Potassium: 4.6 mmol/L (ref 3.5–5.1)
Potassium: 4.3 mmol/L (ref 3.5–5.1)
Sodium: 145 mmol/L (ref 135–145)
Sodium: 144 mmol/L (ref 135–145)
TCO2: 35 mmol/L — ABNORMAL HIGH (ref 22–32)
TCO2: 28 mmol/L (ref 22–32)
pCO2 arterial: 98.9 mmHg (ref 32–48)
pCO2 arterial: 35.7 mmHg (ref 32–48)
pH, Arterial: 7.125 — CL (ref 7.35–7.45)
pH, Arterial: 7.475 — ABNORMAL HIGH (ref 7.35–7.45)
pO2, Arterial: 613 mmHg — ABNORMAL HIGH (ref 83–108)
pO2, Arterial: 268 mmHg — ABNORMAL HIGH (ref 83–108)

## 2024-04-01 LAB — CBC
HCT: 38.6 % — ABNORMAL LOW (ref 39.0–52.0)
Hemoglobin: 11.7 g/dL — ABNORMAL LOW (ref 13.0–17.0)
MCH: 35.1 pg — ABNORMAL HIGH (ref 26.0–34.0)
MCHC: 30.3 g/dL (ref 30.0–36.0)
MCV: 115.9 fL — ABNORMAL HIGH (ref 80.0–100.0)
Platelets: 358 10*3/uL (ref 150–400)
RBC: 3.33 MIL/uL — ABNORMAL LOW (ref 4.22–5.81)
RDW: 15.5 % (ref 11.5–15.5)
WBC: 15.4 10*3/uL — ABNORMAL HIGH (ref 4.0–10.5)
nRBC: 0 % (ref 0.0–0.2)

## 2024-04-01 LAB — COMPREHENSIVE METABOLIC PANEL WITH GFR
ALT: 26 U/L (ref 0–44)
AST: 123 U/L — ABNORMAL HIGH (ref 15–41)
Albumin: 2.8 g/dL — ABNORMAL LOW (ref 3.5–5.0)
Alkaline Phosphatase: 320 U/L — ABNORMAL HIGH (ref 38–126)
Anion gap: 9 (ref 5–15)
BUN: 33 mg/dL — ABNORMAL HIGH (ref 8–23)
CO2: 27 mmol/L (ref 22–32)
Calcium: 9.5 mg/dL (ref 8.9–10.3)
Chloride: 106 mmol/L (ref 98–111)
Creatinine, Ser: 0.96 mg/dL (ref 0.61–1.24)
GFR, Estimated: >60 mL/min (ref >60)
Glucose, Bld: 182 mg/dL — ABNORMAL HIGH (ref 70–99)
Potassium: 4.6 mmol/L (ref 3.5–5.1)
Sodium: 142 mmol/L (ref 135–145)
Total Bilirubin: 2 mg/dL — ABNORMAL HIGH (ref 0.0–1.2)
Total Protein: 9.9 g/dL — ABNORMAL HIGH (ref 6.5–8.1)

## 2024-04-01 MED ORDER — KETAMINE HCL 50 MG/5ML IJ SOSY
20.0000 mg | PREFILLED_SYRINGE | Freq: Once | INTRAMUSCULAR | Status: AC
  Administered 2024-04-01: 20 mg via INTRAVENOUS

## 2024-04-01 MED ORDER — KETAMINE HCL 50 MG/5ML IJ SOSY
PREFILLED_SYRINGE | INTRAMUSCULAR | Status: AC
  Filled 2024-04-01: qty 20

## 2024-04-01 MED ORDER — KETAMINE HCL 50 MG/5ML IJ SOSY
PREFILLED_SYRINGE | INTRAMUSCULAR | Status: AC
Start: 2024-04-01 — End: ?
  Filled 2024-04-01: qty 5

## 2024-04-01 NOTE — ED Notes (Signed)
Anethesia at bedside.

## 2024-04-01 NOTE — ED Triage Notes (Signed)
 Patient arrived from [facility]. [facility] initially admitted to [facility] for [facility] shock. Patient brought to ED due to tracheostomy problems. [facility] reports high peak volumes along with difficulty suctioning. Patient alert to self per [facility]. Patient has PEG and suprapubic catheter in place. Patient has hx of sezures and parkinsons. Has been in [facility] for 2-3 weeks. Full code.

## 2024-04-01 NOTE — ED Provider Notes (Signed)
 Blood pressure 91/62, pulse 98, temperature (!) 97.4 F (36.3 C), temperature source Tympanic, resp. rate (!) 25, SpO2 100%.  Assuming care from Dr. Theadore.  In short, Cory Pacheco is a 78 y.o. male with a chief complaint of Trach Problem .  Refer to the original H&P for additional details.  The current plan of care is to follow up on ABG.  08:40 AM  ABG normalized. Patient looking well. Stable for discharge.     Darra Fonda MATSU, MD 04/01/24 [phone]

## 2024-04-01 NOTE — ED Notes (Signed)
 ENT at bedside

## 2024-04-01 NOTE — ED Provider Notes (Signed)
 [facility]-EMERGENCY DEPT [facility] Emergency Department Provider Note MRN:  [id number]  Arrival date & time: 04/01/24     Chief Complaint   Trach Problem   History of Present Illness   Cory Pacheco is a 78 y.o. year-old male with a history of hypertension, hypoactive dementia, Parkinson's disease presenting to the ED with chief complaint of tracheostomy problem.  Patient in long-term care facility recovering from an extensive hospital stay for pneumonia that required intubation and then tracheostomy placement.  The tracheostomy was originally placed March 26.  The tracheostomy was replaced on April 14.  For the past 2 days patient has had significant resistance with regard to the tracheostomy tube, resistance with suction, resistance with bagging.  Has had very elevated peak pressures, has had great difficulty receiving appropriate volumes.  Review of Systems  A thorough review of systems was obtained and all systems are negative except as noted in the HPI and PMH.   Patient's Health History    Past Medical History:  Diagnosis Date   Acute kidney injury (HCC)    Anxiety    Arthritis    Atrial fibrillation (HCC)    Depression    Heart failure (HCC)    Hypertension    Neurogenic bladder    OSA (obstructive sleep apnea)    Parkinson's disease (HCC)    Schizophrenia (HCC)    Seizure (HCC)     Past Surgical History:  Procedure Laterality Date   CHOLECYSTECTOMY     COLONOSCOPY     COLONOSCOPY N/A 08/03/2013   Procedure: COLONOSCOPY;  Surgeon: Ruby Corporal, MD;  Location: [facility];  Service: Endoscopy;  Laterality: N/A;  930   TONSILLECTOMY      Family History  Problem Relation Age of Onset   Cancer Sister    Sudden death Neg Hx    Sickle cell trait Neg Hx    Lupus Neg Hx    Anesthesia problems Neg Hx     Social History   Socioeconomic History   Marital status: Married    Spouse name: Not on file   Number of children: Not on file   Years of education:  Not on file   Highest education level: Not on file  Occupational History   Not on file  Tobacco Use   Smoking status: Never   Smokeless tobacco: Never  Vaping Use   Vaping status: Never Used  Substance and Sexual Activity   Alcohol  use: Never   Drug use: Never   Sexual activity: Yes  Other Topics Concern   Not on file  Social History Narrative   Not on file   Social Drivers of Health   Financial Resource Strain: Low Risk  (03/24/2024)   Received from [facility]   Overall Financial Resource Strain (CARDIA)    Difficulty of Paying Living Expenses: Not hard at all  Food Insecurity: No Food Insecurity (03/24/2024)   Received from [facility]   Hunger Vital Sign    Worried About Running Out of Food in the Last Year: Never true    Ran Out of Food in the Last Year: Never true  Transportation Needs: Patient Unable To Answer (03/23/2024)   Received from [facility]   SM SDOH Transportation Source    Has lack of transportation kept you from medical appointments or from getting medications?: Unable to respond    Has lack of transportation kept you from meetings, work, or from getting things needed for daily living?: Unable to respond  Physical Activity: Inactive (04/03/2023)   Received from [facility]   Exercise Vital Sign    Days of Exercise per Week: 0 days    Minutes of Exercise per Session: 0 min  Stress: Patient Unable To Answer (03/23/2024)   Received from [facility]   [employer] of Occupational Health - Occupational Stress Questionnaire    Feeling of Stress : Patient unable to answer  Social Connections: Moderately Integrated (03/24/2024)   Received from [facility]   Social Connection and Isolation Panel [NHANES]    Frequency of Communication with Friends and Family: More than three times a week    Frequency of Social Gatherings with Friends and Family: More than three times a week    Attends Religious Services: 1 to 4 times per year     Active Member of [employer] or Organizations: No    Attends Banker Meetings: Never    Marital Status: Married  Catering manager Violence: Patient Unable To Answer (03/23/2024)   Received from [facility]   Domestic Abuse Assessment    Do you feel safe in your relationships at home?: Unable to assess    Physical Abuse: Unable to assess    HRSN Domestic Abuse - Type of Abuse: Not on file    HRSN Domestic Abuse - Time Frame: Not on file    HRSN Domestic Abuse - Signs and Symptoms: Not on file    Verbal Abuse: Unable to assess    HRSN Domestic Abuse - Reported To: Not on file     Physical Exam   Vitals:   04/01/24 0600 04/01/24 0621  BP: 119/73   Pulse: (!) 104 (!) 120  Resp: (!) 29 (!) 28  Temp:    SpO2: 100% 100%    CONSTITUTIONAL: Chronically ill-appearing, NAD NEURO/PSYCH: Somnolent, eyes open but no blink to threat, not really responding to anything EYES:  eyes equal and reactive ENT/NECK:  no LAD, no JVD, tracheostomy in place CARDIO: Regular rate, well-perfused, normal S1 and S2 PULM:  CTAB no wheezing or rhonchi GI/GU:  non-distended, non-tender MSK/SPINE:  No gross deformities, no edema SKIN:  no rash, atraumatic   *Additional and/or pertinent findings included in MDM below  Diagnostic and Interventional Summary    EKG Interpretation Date/Time:    Ventricular Rate:    PR Interval:    QRS Duration:    QT Interval:    QTC Calculation:   R Axis:      Text Interpretation:         Labs Reviewed  CBC - Abnormal; Notable for the following components:      Result Value   WBC 15.4 (*)    RBC 3.33 (*)    Hemoglobin 11.7 (*)    HCT 38.6 (*)    MCV 115.9 (*)    MCH 35.1 (*)    All other components within normal limits  COMPREHENSIVE METABOLIC PANEL WITH GFR - Abnormal; Notable for the following components:   Glucose, Bld 182 (*)    BUN 33 (*)    Total Protein 9.9 (*)    Albumin 2.8 (*)    AST 123 (*)    Alkaline Phosphatase 320 (*)     Total Bilirubin 2.0 (*)    All other components within normal limits  I-STAT ARTERIAL BLOOD GAS, ED - Abnormal; Notable for the following components:   pH, Arterial 7.125 (*)    pCO2 arterial 98.9 (*)    pO2, Arterial 613 (*)  Bicarbonate 32.5 (*)    TCO2 35 (*)    HCT 37.0 (*)    Hemoglobin 12.6 (*)    All other components within normal limits  BLOOD GAS, ARTERIAL    DG Chest Port 1 View  Final Result      Medications  ketamine  50 mg in normal saline 5 mL (10 mg/mL) syringe (20 mg Intravenous Given 04/01/24 0539)     Procedures  /  Critical Care .Critical Care  Performed by: Edson Graces, MD Authorized by: Edson Graces, MD   Critical care provider statement:    Critical care time (minutes):  75   Critical care was necessary to treat or prevent imminent or life-threatening deterioration of the following conditions: Tracheostomy malfunction with hypercarbic respiratory failure.   Critical care was time spent personally by me on the following activities:  Development of treatment plan with patient or surrogate, discussions with consultants, evaluation of patient's response to treatment, examination of patient, ordering and review of laboratory studies, ordering and review of radiographic studies, ordering and performing treatments and interventions, pulse oximetry, re-evaluation of patient's condition and review of old charts   ED Course and Medical Decision Making  Initial Impression and Ddx Concern for tracheostomy obstruction of some kind, possibly false passage.  Issues discussed with Dr. Virgia Griffins of ENT who will come to evaluate.  Despite the report patient is with 100% oxygen saturations.  Is having quite elevated pressures on the vent here in the emergency department, somewhat inadequate tidal volumes.  Will monitor closely while we await ENT intervention.  Past medical/surgical history that increases complexity of ED encounter: Parkinson's, dementia, chronic respiratory  failure  Interpretation of Diagnostics I personally reviewed the Chest Xray and my interpretation is as follows: No pneumothorax  Labs reveal respiratory acidosis on VBG with pCO2 near 100.  Patient Reassessment and Ultimate Disposition/Management     The ABG is concerning, patient will need intervention in some form as the current situation is not adequate.  Began setting up for endotracheal intubation from above.  Dr. Virgia Griffins of ENT arrived.  We also paged anesthesia and are thankful for their swift arrival here in the emergency department for assistance.  Dr. Virgia Griffins was able to adjust the tracheostomy tube, it was very positionally dependent, patient's ventilator numbers improve dramatically when the tracheostomy tube was pushed inward.  It was determined that the tracheostomy tube shape and length were inadequate, too short.  Dr. Virgia Griffins performed a tracheostomy tube switch here in the emergency department with anesthesia standing by for assistance as needed.  It seemed to go quite smoothly.  Plan is to now observe the patient here in the emergency department to ensure the situation is stable, recheck ABG to ensure improvement.  Signed out to oncoming provider at shift change.  [facility] will likely be [facility].  Patient management required discussion with the following services or consulting groups:  ENT/Plastic Surgery and anesthesia  Complexity of Problems Addressed Acute illness or injury that poses threat of life of bodily function  Additional Data Reviewed and Analyzed Further history obtained from: Further history from spouse/family member  Additional Factors Impacting ED Encounter Risk Consideration of hospitalization  Merrick Abe. Harless Lien, MD [facility] Emergency Medicine [facility] [email or web address]  Final Clinical Impressions(s) / ED Diagnoses     ICD-10-CM   1. Tracheostomy malfunction (HCC)  J95.03       ED Discharge Orders     None  Discharge Instructions Discussed with and Provided to Patient:   Discharge Instructions   None      Edson Graces, MD 04/01/24 [phone]

## 2024-04-01 NOTE — Consult Note (Signed)
 Reason for Consult:trach complication Referring Physician: Dr Theadore Lynwood JAYSON Cory Pacheco is an 78 y.o. male.  HPI: hx of trach on 03/09/24 at [facility]. He had trach change on 4/14. He is at [facility]. He started having high pressure and couldn't suction. Came to ER. He has high CO2 and high pressure 30-35. Low volume.   Past Medical History:  Diagnosis Date   Anxiety    Arthritis    Depression    Hypertension    Seizure [facility])     Past Surgical History:  Procedure Laterality Date   CHOLECYSTECTOMY     COLONOSCOPY     COLONOSCOPY N/A 08/03/2013   Procedure: COLONOSCOPY;  Surgeon: Claudis RAYMOND Rivet, MD;  Location[facility];  Service: Endoscopy;  Laterality: N/A;  930   TONSILLECTOMY      Family History  Problem Relation Age of Onset   Cancer Sister    Sudden death Neg Hx    Sickle cell trait Neg Hx    Lupus Neg Hx    Anesthesia problems Neg Hx     Social History:  reports that he has never smoked. He has never used smokeless tobacco. He reports that he does not drink alcohol  and does not use drugs.  Allergies:  Allergies  Allergen Reactions   Penicillins Hives   Bupropion Other (See Comments)    Patient not sure   Metformin Other (See Comments)    Patient not sure   Sulfa Antibiotics Other (See Comments)    Patient not sure    Medications: I have reviewed the patient's current medications.  Results for orders placed or performed during the hospital encounter of 04/01/24 (from the past 48 hours)  I-Stat arterial blood gas, ED     Status: Abnormal   Collection Time: 04/01/24  5:34 AM  Result Value Ref Range   pH, Arterial 7.125 (LL) 7.35 - 7.45   pCO2 arterial 98.9 (HH) 32 - 48 mmHg   pO2, Arterial 613 (H) 83 - 108 mmHg   Bicarbonate 32.5 (H) 20.0 - 28.0 mmol/L   TCO2 35 (H) 22 - 32 mmol/L   O2 Saturation 100 %   Acid-Base Excess 1.0 0.0 - 2.0 mmol/L   Sodium 145 135 - 145 mmol/L   Potassium 4.6 3.5 - 5.1 mmol/L   Calcium, Ion 1.34 1.15 - 1.40 mmol/L   HCT 37.0 (L)  39.0 - 52.0 %   Hemoglobin 12.6 (L) 13.0 - 17.0 g/dL   Sample type ARTERIAL    Comment NOTIFIED PHYSICIAN     DG Chest Port 1 View Result Date: 04/01/2024 CLINICAL DATA:  Tracheostomy problem. EXAM: PORTABLE CHEST 1 VIEW COMPARISON:  Portable chest 03/29/2024 FINDINGS: Tracheostomy tip remains 7.7 cm from the carina in between the heads of the clavicles. Right CP sulcus was clipped from the exam. There is smooth pleural thickening in the lateral left lower lung field which could be a small loculated effusion or true pleural thickening. No nodular thickening is seen. Visualized lungs are clear. The cardiomediastinal silhouette and vasculature are normal apart from calcification in the transverse aorta. Thoracic spine fusion hardware is again noted. Osteopenia. No osseous findings. IMPRESSION: 1. Tracheostomy tip remains 7.7 cm from the carina in between the heads of the clavicles. 2. Smooth pleural thickening in the lateral left lower lung field which could be a small loculated effusion or true pleural thickening. 3. Aortic atherosclerosis. Electronically Signed   By: Francis Quam M.D.   On: 04/01/2024 06:01  ROS Blood pressure 105/83, pulse (!) 120, temperature (!) 97.4 F (36.3 C), temperature source Tympanic, resp. rate (!) 28, SpO2 100%. Physical Exam HENT:     Mouth/Throat:     Comments: Foe- scope passed into the trach and the trach is against the back wall. It was pushed into the trach further and the pressure and volume normalized. It was felt the trach is not long enough to stay in the trachea. The trach was removed and a adjustable Bivona placed without difficulty. The scope confirmed the trach in the trachea and it was adjusted to length using the scope. He had normal setting on vent. No bleeding      Assessment/Plan: Trach complication- the adjustable Bivona placed and he now has normal vent setting. It is in place confirmed by fibroptic. He can return to [facility].  Norleen Notice 04/01/2024, 6:25 AM

## 2024-04-02 ENCOUNTER — Emergency Department (HOSPITAL_COMMUNITY)
Admission: EM | Admit: 2024-04-02 | Discharge: 2024-04-02 | Disposition: A | Discharge status: Home / Self Care | Attending: Emergency Medicine | Admitting: Emergency Medicine

## 2024-04-02 ENCOUNTER — Emergency Department (HOSPITAL_COMMUNITY)

## 2024-04-02 ENCOUNTER — Inpatient Hospital Stay
Admission: RE | Admit: 2024-04-02 | Discharge: 2024-05-30 | Disposition: A | Discharge status: Home / Self Care | Source: Intra-hospital | Attending: Internal Medicine | Admitting: Internal Medicine

## 2024-04-02 ENCOUNTER — Institutional Professional Consult (permissible substitution) (HOSPITAL_COMMUNITY): Payer: Self-pay

## 2024-04-02 ENCOUNTER — Other Ambulatory Visit: Payer: Self-pay

## 2024-04-02 DIAGNOSIS — J9500 Unspecified tracheostomy complication: Secondary | ICD-10-CM

## 2024-04-02 DIAGNOSIS — T17998A Other foreign object in respiratory tract, part unspecified causing other injury, initial encounter: Secondary | ICD-10-CM | POA: Diagnosis not present

## 2024-04-02 DIAGNOSIS — I700 Atherosclerosis of aorta: Secondary | ICD-10-CM | POA: Insufficient documentation

## 2024-04-02 DIAGNOSIS — Z7901 Long term (current) use of anticoagulants: Secondary | ICD-10-CM | POA: Diagnosis not present

## 2024-04-02 DIAGNOSIS — J9509 Other tracheostomy complication: Secondary | ICD-10-CM | POA: Insufficient documentation

## 2024-04-02 LAB — BLOOD GAS, ARTERIAL
Acid-base deficit: 0.1 mmol/L (ref 0.0–2.0)
Bicarbonate: 33.6 mmol/L — ABNORMAL HIGH (ref 20.0–28.0)
O2 Saturation: 99.1 %
Patient temperature: 37
pCO2 arterial: 116 mmHg (ref 32–48)
pH, Arterial: 7.07 — CL (ref 7.35–7.45)
pO2, Arterial: 130 mmHg — ABNORMAL HIGH (ref 83–108)

## 2024-04-02 LAB — TRIGLYCERIDES: Triglycerides: 150 mg/dL — ABNORMAL HIGH (ref <150)

## 2024-04-02 LAB — BASIC METABOLIC PANEL WITH GFR
Anion gap: 10 (ref 5–15)
BUN: 35 mg/dL — ABNORMAL HIGH (ref 8–23)
CO2: 27 mmol/L (ref 22–32)
Calcium: 9.2 mg/dL (ref 8.9–10.3)
Chloride: 106 mmol/L (ref 98–111)
Creatinine, Ser: 1 mg/dL (ref 0.61–1.24)
GFR, Estimated: >60 mL/min (ref >60)
Glucose, Bld: 179 mg/dL — ABNORMAL HIGH (ref 70–99)
Potassium: 4.5 mmol/L (ref 3.5–5.1)
Sodium: 143 mmol/L (ref 135–145)

## 2024-04-02 MED ORDER — PROPOFOL 1000 MG/100ML IV EMUL
5.0000 ug/kg/min | INTRAVENOUS | Status: DC
  Administered 2024-04-02: 15 ug/kg/min via INTRAVENOUS
  Filled 2024-04-02: qty 100

## 2024-04-02 NOTE — ED Provider Notes (Signed)
 [place] EMERGENCY DEPARTMENT AT [facility] Provider Note   CSN: [id number] Arrival date & time: 04/02/24  1545     History  Chief Complaint  Patient presents with   Tracheostomy Displacement    Cory Pacheco is a 78 y.o. male.  History obtained by staff from [facility].  Patient came down with respiratory therapist and nurse.  Ultimately he has a recently new trach.  He had his prior trach adjusted yesterday to a Bivona 7.  They have been having trouble at the [facility] trying to get him on either volume control or pressure control.  When his setting is for pressure control his tidal volumes are low and when he is on volume control his pressure is too high.  They are having a hard time, suctioning at times as well.  They have been having a hard time with his trach but he has now an adjustable trach that they have not really adjusted today.  No other concerns.  No fever.  He has been at his baseline.  He is on a chronic propofol  infusion.        Home Medications Prior to Admission medications   Medication Sig Start Date End Date Taking? Authorizing Provider  acetaminophen  (TYLENOL ) 160 MG/5ML elixir Take 650 mg by mouth every 4 (four) hours as needed for fever.    [provider]  acetic acid  0.25 % irrigation Irrigate with as directed 2 (two) times daily. Instill 60cc twice a day through suprapubic tube to keep catheter patent 04/23/22   McKenzie, Arden Beck, MD  albuterol  (PROVENTIL  HFA;VENTOLIN  HFA) 108 (90 Base) MCG/ACT inhaler Inhale 2 puffs into the lungs every 6 (six) hours as needed for wheezing or shortness of breath.    [provider]  AMBULATORY NON FORMULARY MEDICATION Home health nurse may exchange 22 french suprapubic catheter (SP tube) at home as needed. 12/17/23   Lauretta Ponto, FNP  apixaban (ELIQUIS) 5 MG TABS tablet 5 mg by Does not apply route 2 (two) times daily. 01/18/21   [provider]  Ascorbic Acid (VITAMIN C)  1000 MG tablet Take 1,000 mg by mouth daily.    [provider]  benzonatate (TESSALON) 200 MG capsule Take 200 mg by mouth 3 (three) times daily as needed. 09/20/22   [provider]  carbidopa-levodopa (PARCOPA) 25-100 MG disintegrating tablet Take 2 tablets by mouth 4 (four) times daily.    [provider]  Cholecalciferol  (VITAMIN D3) 2000 UNITS TABS Take 1 tablet by mouth daily.    [provider]  clotrimazole  (LOTRIMIN ) 1 % cream Apply 1 application. topically 2 (two) times daily. 04/28/22   Summerlin, Julienne Annette, PA-C  diltiazem (CARDIZEM) 30 MG tablet Take 30 mg by mouth 3 (three) times daily.    [provider]  divalproex  (DEPAKOTE ) 500 MG DR tablet Take 1 tablet (500 mg total) by mouth 3 (three) times daily. 05/12/17   Elgergawy, Ardia Kraft, MD  Divalproex  Sodium (DEPAKOTE  PO) 5 mLs by Feeding Tube route 3 (three) times daily. 250/5 mls    [provider]  fluticasone (FLONASE) 50 MCG/ACT nasal spray Place 2 sprays into both nostrils daily. 11/25/21   [provider]  folic acid  (FOLVITE ) 1 MG tablet Take 2 mg by mouth daily.    [provider]  furosemide (LASIX) 10 MG/ML solution Place 20 mg into feeding tube daily. 11/25/21   [provider]  lacosamide (VIMPAT) 10 MG/ML oral solution Take by  mouth See admin instructions. 20 ml by mouth twice a day 11/25/21   [provider]  lansoprazole (PREVACID SOLUTAB) 30 MG disintegrating tablet Take 30 mg by mouth 2 (two) times daily.    [provider]  metoprolol  tartrate (LOPRESSOR ) 50 MG tablet Take 1 tablet (50 mg total) by mouth 3 (three) times daily. 01/01/22   Wendie Hamburg, MD  Nutritional Supplements (FEEDING SUPPLEMENT, OSMOLITE 1.2 CAL,) LIQD Place into feeding tube daily. 2 quarts every day    [provider]  nystatin (MYCOSTATIN/NYSTOP) powder Apply 1 application topically 3 (three) times daily.    [provider]  protein supplement (PROSOURCE NO CARB) LIQD 30 mLs.    [provider]  risperiDONE (RISPERDAL) 1 MG/ML oral solution Take by mouth See admin instructions. Give 1 and 1/2 ml s by route of feeding tube twice a day.    [provider]  zinc sulfate 220 (50 Zn) MG capsule Take 220 mg by mouth daily. 11/25/21   [provider]      Allergies    Penicillins, Bupropion, Metformin, and Sulfa antibiotics    Review of Systems   Review of Systems  Physical Exam Updated Vital Signs BP 110/61   Pulse 83   Resp (!) 24   Ht 5\' 11"  (1.803 m)   Wt 107.5 kg   SpO2 100%   BMI 33.05 kg/m  Physical Exam Vitals and nursing note reviewed.  Constitutional:      General: He is not in acute distress.    Appearance: He is well-developed.  HENT:     Head: Normocephalic and atraumatic.  Eyes:     Conjunctiva/sclera: Conjunctivae normal.  Cardiovascular:     Rate and Rhythm: Normal rate and regular rhythm.     Heart sounds: No murmur heard. Pulmonary:     Effort: Pulmonary effort is normal. No respiratory distress.     Breath sounds: Normal breath sounds.     Comments: Trach in place no obvious secretions Abdominal:     Palpations: Abdomen is soft.     Tenderness: There is no abdominal tenderness.  Musculoskeletal:        General: No swelling.     Cervical back: Neck supple.  Skin:    General: Skin is warm and dry.     Capillary Refill: Capillary refill takes less than 2 seconds.  Neurological:     Mental Status: Mental status is at baseline.  Psychiatric:        Mood and Affect: Mood normal.     ED Results / Procedures / Treatments   Labs (all labs ordered are listed, but only abnormal results are displayed) Labs Reviewed - No data to display  EKG None  Radiology DG Chest Portable 1 View Result Date: 04/02/2024 CLINICAL DATA:  Increased secretions EXAM: PORTABLE CHEST 1 VIEW COMPARISON:  04/02/2024, 03/22/2024, 03/05/2017 FINDINGS:  Tracheostomy tube tip about 3.1 cm superior to carina. Posterior spinal rods and fixating screws. Small left pleural effusion or pleural thickening without significant change. Minimal streaky left lung base opacity also without change. Mild diffuse bronchitic changes. IMPRESSION: Similar small left effusion or pleural thickening. Diffuse bronchitic changes. No change in streaky atelectasis or minimal infiltrate in the left lung base Electronically Signed   By: Esmeralda Hedge M.D.   On: 04/02/2024 18:40   DG Chest Port 1 View Result Date: 04/02/2024 CLINICAL DATA:  Respiratory failure. EXAM: PORTABLE CHEST 1 VIEW COMPARISON:  04/01/2024 FINDINGS: Tracheostomy tube tip is  above the carina. Stable cardiomediastinal contours. Unchanged blunting of the left costophrenic angle. No frank interstitial edema or consolidative change. None thoracic spine fusion hardware is again noted. IMPRESSION: 1. No acute findings. 2. Unchanged blunting of the left costophrenic angle which may reflect a small pleural effusion versus pleural thickening. Electronically Signed   By: Kimberley Penman M.D.   On: 04/02/2024 11:19   DG Chest Port 1 View Result Date: 04/01/2024 CLINICAL DATA:  Tracheostomy problem. EXAM: PORTABLE CHEST 1 VIEW COMPARISON:  Portable chest 03/29/2024 FINDINGS: Tracheostomy tip remains 7.7 cm from the carina in between the heads of the clavicles. Right CP sulcus was clipped from the exam. There is smooth pleural thickening in the lateral left lower lung field which could be a small loculated effusion or true pleural thickening. No nodular thickening is seen. Visualized lungs are clear. The cardiomediastinal silhouette and vasculature are normal apart from calcification in the transverse aorta. Thoracic spine fusion hardware is again noted. Osteopenia. No osseous findings. IMPRESSION: 1. Tracheostomy tip remains 7.7 cm from the carina in between the heads of the clavicles. 2. Smooth pleural thickening in the lateral  left lower lung field which could be a small loculated effusion or true pleural thickening. 3. Aortic atherosclerosis. Electronically Signed   By: Denman Fischer M.D.   On: 04/01/2024 06:01    Procedures Procedures    Medications Ordered in ED Medications  propofol  (DIPRIVAN ) 1000 MG/100ML infusion (15 mcg/kg/min  122 kg Intravenous New Bag/Given 04/02/24 1606)    ED Course/ Medical Decision Making/ A&P                                 Medical Decision Making Amount and/or Complexity of Data Reviewed Radiology: ordered.  Risk Prescription drug management.   Merideth Stands Iacobucci is here with tracheostomy issue.  Patient with history of Parkinson's trach dependent now vent dependent.  He had above on a 7 trach placed yesterday.  This is an adjustable trach.  Having a hard time with either pressure control volume control.  When they put him on pressure control he has low volumes when they put him on volume control his pressures were too high.  Ultimately I called Dr. Felipe Horton with ICU team who came down to evaluate the vent and trach and ultimately he was able to make an adjustment with the trach itself to push it in a little bit further and got good adjustment.  I talked with Dr. Lydia Sams with ENT who was fine with Dr. Felipe Horton evaluating the patient and evaluating the trach.  Ultimately we had Dr. Felipe Horton come down and he did a great job of adjusting the trach.  Now on pressure control he is pulling great volumes.  Able to suction him without any issues.  Ultimately chest x-ray was done which was unremarkable.  ICU team states he is safe for transfer back to [facility].  Dr. Felipe Horton actually is credentialed to go over to [facility] to help with issues like this and he has made himself available today and tomorrow to help out if needed but hopefully things will now be improved.  No concern for other issues.  I have tried to get in touch with family.  Discharge back to facility.  This chart was dictated using voice  recognition software.  Despite best efforts to proofread,  errors can occur which can change the documentation meaning.  Final Clinical Impression(s) / ED Diagnoses Final diagnoses:  Tracheostomy complication, unspecified complication type [facility])    Rx / DC Orders ED Discharge Orders     None         Lowery Rue, DO 04/02/24 1845

## 2024-04-02 NOTE — Consult Note (Signed)
 04/02/2024 Brief consult note:  Here for issues with bivona and inability to ventilate.  ER RT moved back bivona with some success but still breath stacking.  Will bronch and see if current bivona salvagable.  Can return to [facility] after if we find good spot.  Ardelle Kos MD PCCM

## 2024-04-02 NOTE — Discharge Instructions (Signed)
 Please contact Dr. Ardelle Kos with ICU team tonight or tomorrow if you are having any further issues.  He can come over to [facility] and help out.

## 2024-04-02 NOTE — Procedures (Signed)
 Bronchoscopy Procedure Note  Cory Pacheco  [id number]  05-24-1946  Date:04/02/24  Time:5:20 PM   Provider Performing:Bernhardt Riemenschneider C [name]   Procedure(s):  Flexible Bronchoscopy [phone]) and Initial Therapeutic Aspiration of Tracheobronchial Tree [phone])  Indication(s) Trouble ventilating  Consent Unable to obtain consent due to emergent nature of procedure.  Anesthesia Already on prop   Time Out Verified patient identification, verified procedure, site/side was marked, verified correct patient position, special equipment/implants available, medications/allergies/relevant history reviewed, required imaging and test results available.   Sterile Technique Usual hand hygiene, masks, gowns, and gloves were used   Procedure Description Bronchoscope advanced through tracheostomy tube and into airway.  Airways were examined down to subsegmental level with findings noted below.     Findings:  Bivona at current length against a ridge of granulation tissue. Advanced bivona down and seated well about 5 cm above carina. R mainstem with some blood clots likely from suctioning: this was suctioned out Immediate improvement in tidal volumes on PC. If issues with this in future use bronch to readjust or just advance bivona a few cm. Can help PRN, [facility] has my contact info  Complications/Tolerance None; patient tolerated the procedure well. Chest X-ray is not needed post procedure.   EBL Minimal   Specimen(s) None

## 2024-04-02 NOTE — ED Triage Notes (Signed)
 Patient arrived from [facility] [facility], Patient was admitted to [facility] for septic shock, brought to ED due to tracheostomy problems. Bivona 7 in place and currently not ventilating according to [facility] respiratory therapy. Patient has a PEG tube and suprapubic catheter in place.

## 2024-04-03 LAB — BASIC METABOLIC PANEL WITH GFR
Anion gap: 13 (ref 5–15)
BUN: 35 mg/dL — ABNORMAL HIGH (ref 8–23)
CO2: 22 mmol/L (ref 22–32)
Calcium: 9 mg/dL (ref 8.9–10.3)
Chloride: 108 mmol/L (ref 98–111)
Creatinine, Ser: 0.99 mg/dL (ref 0.61–1.24)
GFR, Estimated: >60 mL/min (ref >60)
Glucose, Bld: 152 mg/dL — ABNORMAL HIGH (ref 70–99)
Potassium: 3.5 mmol/L (ref 3.5–5.1)
Sodium: 143 mmol/L (ref 135–145)

## 2024-04-03 LAB — BLOOD GAS, ARTERIAL
Acid-Base Excess: 5.8 mmol/L — ABNORMAL HIGH (ref 0.0–2.0)
Bicarbonate: 26.5 mmol/L (ref 20.0–28.0)
O2 Saturation: 99.8 %
Patient temperature: 37
pCO2 arterial: 27 mmHg — ABNORMAL LOW (ref 32–48)
pH, Arterial: 7.6 — ABNORMAL HIGH (ref 7.35–7.45)
pO2, Arterial: 222 mmHg — ABNORMAL HIGH (ref 83–108)

## 2024-04-04 ENCOUNTER — Other Ambulatory Visit (HOSPITAL_COMMUNITY)

## 2024-04-04 LAB — BASIC METABOLIC PANEL WITH GFR
Anion gap: 11 (ref 5–15)
BUN: 31 mg/dL — ABNORMAL HIGH (ref 8–23)
CO2: 25 mmol/L (ref 22–32)
Calcium: 8.7 mg/dL — ABNORMAL LOW (ref 8.9–10.3)
Chloride: 105 mmol/L (ref 98–111)
Creatinine, Ser: 0.82 mg/dL (ref 0.61–1.24)
GFR, Estimated: >60 mL/min (ref >60)
Glucose, Bld: 134 mg/dL — ABNORMAL HIGH (ref 70–99)
Potassium: 3.2 mmol/L — ABNORMAL LOW (ref 3.5–5.1)
Sodium: 141 mmol/L (ref 135–145)

## 2024-04-04 LAB — BLOOD GAS, ARTERIAL
Acid-Base Excess: 5.2 mmol/L — ABNORMAL HIGH (ref 0.0–2.0)
Bicarbonate: 26.6 mmol/L (ref 20.0–28.0)
O2 Saturation: 99.6 %
Patient temperature: 36.4
pCO2 arterial: 28 mmHg — ABNORMAL LOW (ref 32–48)
pH, Arterial: 7.58 — ABNORMAL HIGH (ref 7.35–7.45)
pO2, Arterial: 120 mmHg — ABNORMAL HIGH (ref 83–108)

## 2024-04-04 LAB — CBC
HCT: 30.2 % — ABNORMAL LOW (ref 39.0–52.0)
Hemoglobin: 9.5 g/dL — ABNORMAL LOW (ref 13.0–17.0)
MCH: 34.5 pg — ABNORMAL HIGH (ref 26.0–34.0)
MCHC: 31.5 g/dL (ref 30.0–36.0)
MCV: 109.8 fL — ABNORMAL HIGH (ref 80.0–100.0)
Platelets: 229 10*3/uL (ref 150–400)
RBC: 2.75 MIL/uL — ABNORMAL LOW (ref 4.22–5.81)
RDW: 15.2 % (ref 11.5–15.5)
WBC: 8.1 10*3/uL (ref 4.0–10.5)
nRBC: 0 % (ref 0.0–0.2)

## 2024-04-05 LAB — BASIC METABOLIC PANEL WITH GFR
Anion gap: 7 (ref 5–15)
BUN: 26 mg/dL — ABNORMAL HIGH (ref 8–23)
CO2: 22 mmol/L (ref 22–32)
Calcium: 8.7 mg/dL — ABNORMAL LOW (ref 8.9–10.3)
Chloride: 109 mmol/L (ref 98–111)
Creatinine, Ser: 0.74 mg/dL (ref 0.61–1.24)
GFR, Estimated: >60 mL/min (ref >60)
Glucose, Bld: 133 mg/dL — ABNORMAL HIGH (ref 70–99)
Potassium: 3.9 mmol/L (ref 3.5–5.1)
Sodium: 138 mmol/L (ref 135–145)

## 2024-04-05 LAB — TRIGLYCERIDES: Triglycerides: 132 mg/dL (ref <150)

## 2024-04-06 LAB — CBC
HCT: 30.6 % — ABNORMAL LOW (ref 39.0–52.0)
Hemoglobin: 9.7 g/dL — ABNORMAL LOW (ref 13.0–17.0)
MCH: 34.4 pg — ABNORMAL HIGH (ref 26.0–34.0)
MCHC: 31.7 g/dL (ref 30.0–36.0)
MCV: 108.5 fL — ABNORMAL HIGH (ref 80.0–100.0)
Platelets: 247 10*3/uL (ref 150–400)
RBC: 2.82 MIL/uL — ABNORMAL LOW (ref 4.22–5.81)
RDW: 14.8 % (ref 11.5–15.5)
WBC: 8.1 10*3/uL (ref 4.0–10.5)
nRBC: 0 % (ref 0.0–0.2)

## 2024-04-07 ENCOUNTER — Other Ambulatory Visit (HOSPITAL_COMMUNITY)

## 2024-04-07 LAB — URINALYSIS, ROUTINE W REFLEX MICROSCOPIC
Bilirubin Urine: NEGATIVE
Glucose, UA: NEGATIVE mg/dL
Ketones, ur: 5 mg/dL — AB
Nitrite: POSITIVE — AB
Protein, ur: 100 mg/dL — AB
RBC / HPF: >50 RBC/hpf (ref 0–5)
Specific Gravity, Urine: 1.02 (ref 1.005–1.030)
WBC, UA: >50 WBC/hpf (ref 0–5)
pH: 5 (ref 5.0–8.0)

## 2024-04-07 LAB — CBC WITH DIFFERENTIAL/PLATELET
Abs Immature Granulocytes: 0.13 10*3/uL — ABNORMAL HIGH (ref 0.00–0.07)
Basophils Absolute: 0 10*3/uL (ref 0.0–0.1)
Basophils Relative: 0 %
Eosinophils Absolute: 0.5 10*3/uL (ref 0.0–0.5)
Eosinophils Relative: 5 %
HCT: 30.4 % — ABNORMAL LOW (ref 39.0–52.0)
Hemoglobin: 9.7 g/dL — ABNORMAL LOW (ref 13.0–17.0)
Immature Granulocytes: 1 %
Lymphocytes Relative: 12 %
Lymphs Abs: 1.2 10*3/uL (ref 0.7–4.0)
MCH: 34.5 pg — ABNORMAL HIGH (ref 26.0–34.0)
MCHC: 31.9 g/dL (ref 30.0–36.0)
MCV: 108.2 fL — ABNORMAL HIGH (ref 80.0–100.0)
Monocytes Absolute: 1.4 10*3/uL — ABNORMAL HIGH (ref 0.1–1.0)
Monocytes Relative: 14 %
Neutro Abs: 6.9 10*3/uL (ref 1.7–7.7)
Neutrophils Relative %: 68 %
Platelets: 239 10*3/uL (ref 150–400)
RBC: 2.81 MIL/uL — ABNORMAL LOW (ref 4.22–5.81)
RDW: 15.2 % (ref 11.5–15.5)
WBC: 10.2 10*3/uL (ref 4.0–10.5)
nRBC: 0 % (ref 0.0–0.2)

## 2024-04-08 LAB — BASIC METABOLIC PANEL WITH GFR
Anion gap: 8 (ref 5–15)
BUN: 23 mg/dL (ref 8–23)
CO2: 25 mmol/L (ref 22–32)
Calcium: 8.7 mg/dL — ABNORMAL LOW (ref 8.9–10.3)
Chloride: 103 mmol/L (ref 98–111)
Creatinine, Ser: 0.73 mg/dL (ref 0.61–1.24)
GFR, Estimated: >60 mL/min (ref >60)
Glucose, Bld: 141 mg/dL — ABNORMAL HIGH (ref 70–99)
Potassium: 4.3 mmol/L (ref 3.5–5.1)
Sodium: 136 mmol/L (ref 135–145)

## 2024-04-08 LAB — TRIGLYCERIDES: Triglycerides: 111 mg/dL (ref <150)

## 2024-04-10 LAB — CBC
HCT: 29.8 % — ABNORMAL LOW (ref 39.0–52.0)
Hemoglobin: 9.6 g/dL — ABNORMAL LOW (ref 13.0–17.0)
MCH: 34.2 pg — ABNORMAL HIGH (ref 26.0–34.0)
MCHC: 32.2 g/dL (ref 30.0–36.0)
MCV: 106 fL — ABNORMAL HIGH (ref 80.0–100.0)
Platelets: 304 10*3/uL (ref 150–400)
RBC: 2.81 MIL/uL — ABNORMAL LOW (ref 4.22–5.81)
RDW: 14.6 % (ref 11.5–15.5)
WBC: 8.3 10*3/uL (ref 4.0–10.5)
nRBC: 0 % (ref 0.0–0.2)

## 2024-04-10 LAB — BASIC METABOLIC PANEL WITH GFR
Anion gap: 9 (ref 5–15)
BUN: 18 mg/dL (ref 8–23)
CO2: 24 mmol/L (ref 22–32)
Calcium: 8.6 mg/dL — ABNORMAL LOW (ref 8.9–10.3)
Chloride: 101 mmol/L (ref 98–111)
Creatinine, Ser: 0.66 mg/dL (ref 0.61–1.24)
GFR, Estimated: >60 mL/min (ref >60)
Glucose, Bld: 131 mg/dL — ABNORMAL HIGH (ref 70–99)
Potassium: 3.8 mmol/L (ref 3.5–5.1)
Sodium: 134 mmol/L — ABNORMAL LOW (ref 135–145)

## 2024-04-10 LAB — MAGNESIUM: Magnesium: 2 mg/dL (ref 1.7–2.4)

## 2024-04-11 LAB — BASIC METABOLIC PANEL WITH GFR
Anion gap: 10 (ref 5–15)
BUN: 20 mg/dL (ref 8–23)
CO2: 23 mmol/L (ref 22–32)
Calcium: 8.7 mg/dL — ABNORMAL LOW (ref 8.9–10.3)
Chloride: 100 mmol/L (ref 98–111)
Creatinine, Ser: 0.65 mg/dL (ref 0.61–1.24)
GFR, Estimated: >60 mL/min (ref >60)
Glucose, Bld: 129 mg/dL — ABNORMAL HIGH (ref 70–99)
Potassium: 3.8 mmol/L (ref 3.5–5.1)
Sodium: 133 mmol/L — ABNORMAL LOW (ref 135–145)

## 2024-04-11 LAB — CULTURE, RESPIRATORY W GRAM STAIN

## 2024-04-11 LAB — TRIGLYCERIDES: Triglycerides: 108 mg/dL (ref <150)

## 2024-04-13 LAB — CBC WITH DIFFERENTIAL/PLATELET
Abs Immature Granulocytes: 0.06 10*3/uL (ref 0.00–0.07)
Basophils Absolute: 0 10*3/uL (ref 0.0–0.1)
Basophils Relative: 0 %
Eosinophils Absolute: 0.5 10*3/uL (ref 0.0–0.5)
Eosinophils Relative: 6 %
HCT: 30.6 % — ABNORMAL LOW (ref 39.0–52.0)
Hemoglobin: 10 g/dL — ABNORMAL LOW (ref 13.0–17.0)
Immature Granulocytes: 1 %
Lymphocytes Relative: 14 %
Lymphs Abs: 1.1 10*3/uL (ref 0.7–4.0)
MCH: 34.1 pg — ABNORMAL HIGH (ref 26.0–34.0)
MCHC: 32.7 g/dL (ref 30.0–36.0)
MCV: 104.4 fL — ABNORMAL HIGH (ref 80.0–100.0)
Monocytes Absolute: 0.9 10*3/uL (ref 0.1–1.0)
Monocytes Relative: 11 %
Neutro Abs: 5.4 10*3/uL (ref 1.7–7.7)
Neutrophils Relative %: 68 %
Platelets: 344 10*3/uL (ref 150–400)
RBC: 2.93 MIL/uL — ABNORMAL LOW (ref 4.22–5.81)
RDW: 14.6 % (ref 11.5–15.5)
WBC: 7.9 10*3/uL (ref 4.0–10.5)
nRBC: 0 % (ref 0.0–0.2)

## 2024-04-14 DIAGNOSIS — J159 Unspecified bacterial pneumonia: Secondary | ICD-10-CM

## 2024-04-14 DIAGNOSIS — I482 Chronic atrial fibrillation, unspecified: Secondary | ICD-10-CM | POA: Diagnosis not present

## 2024-04-14 DIAGNOSIS — J9621 Acute and chronic respiratory failure with hypoxia: Secondary | ICD-10-CM | POA: Diagnosis not present

## 2024-04-14 DIAGNOSIS — Y95 Nosocomial condition: Secondary | ICD-10-CM

## 2024-04-14 DIAGNOSIS — G20B2 Parkinson's disease with dyskinesia, with fluctuations: Secondary | ICD-10-CM | POA: Diagnosis not present

## 2024-04-14 DIAGNOSIS — Z930 Tracheostomy status: Secondary | ICD-10-CM | POA: Diagnosis not present

## 2024-04-14 LAB — BASIC METABOLIC PANEL WITH GFR
Anion gap: 8 (ref 5–15)
BUN: 16 mg/dL (ref 8–23)
CO2: 24 mmol/L (ref 22–32)
Calcium: 8.8 mg/dL — ABNORMAL LOW (ref 8.9–10.3)
Chloride: 102 mmol/L (ref 98–111)
Creatinine, Ser: 0.63 mg/dL (ref 0.61–1.24)
GFR, Estimated: >60 mL/min (ref >60)
Glucose, Bld: 105 mg/dL — ABNORMAL HIGH (ref 70–99)
Potassium: 3.7 mmol/L (ref 3.5–5.1)
Sodium: 134 mmol/L — ABNORMAL LOW (ref 135–145)

## 2024-04-14 LAB — URINALYSIS, ROUTINE W REFLEX MICROSCOPIC
Bilirubin Urine: NEGATIVE
Glucose, UA: NEGATIVE mg/dL
Ketones, ur: NEGATIVE mg/dL
Nitrite: NEGATIVE
Protein, ur: 30 mg/dL — AB
RBC / HPF: >50 RBC/hpf (ref 0–5)
Specific Gravity, Urine: 1.016 (ref 1.005–1.030)
pH: 6 (ref 5.0–8.0)

## 2024-04-14 LAB — TRIGLYCERIDES: Triglycerides: 129 mg/dL (ref <150)

## 2024-04-15 DIAGNOSIS — Z930 Tracheostomy status: Secondary | ICD-10-CM | POA: Diagnosis not present

## 2024-04-15 DIAGNOSIS — J159 Unspecified bacterial pneumonia: Secondary | ICD-10-CM

## 2024-04-15 DIAGNOSIS — I482 Chronic atrial fibrillation, unspecified: Secondary | ICD-10-CM | POA: Diagnosis not present

## 2024-04-15 DIAGNOSIS — Y95 Nosocomial condition: Secondary | ICD-10-CM

## 2024-04-15 DIAGNOSIS — J9621 Acute and chronic respiratory failure with hypoxia: Secondary | ICD-10-CM | POA: Diagnosis not present

## 2024-04-15 DIAGNOSIS — G20B2 Parkinson's disease with dyskinesia, with fluctuations: Secondary | ICD-10-CM | POA: Diagnosis not present

## 2024-04-15 LAB — CULTURE, OB URINE: Culture: <10000 — AB

## 2024-04-16 DIAGNOSIS — Y95 Nosocomial condition: Secondary | ICD-10-CM

## 2024-04-16 DIAGNOSIS — Z930 Tracheostomy status: Secondary | ICD-10-CM | POA: Diagnosis not present

## 2024-04-16 DIAGNOSIS — J9621 Acute and chronic respiratory failure with hypoxia: Secondary | ICD-10-CM | POA: Diagnosis not present

## 2024-04-16 DIAGNOSIS — I482 Chronic atrial fibrillation, unspecified: Secondary | ICD-10-CM | POA: Diagnosis not present

## 2024-04-16 DIAGNOSIS — J159 Unspecified bacterial pneumonia: Secondary | ICD-10-CM

## 2024-04-16 DIAGNOSIS — G20B2 Parkinson's disease with dyskinesia, with fluctuations: Secondary | ICD-10-CM | POA: Diagnosis not present

## 2024-04-16 LAB — CBC
HCT: 31 % — ABNORMAL LOW (ref 39.0–52.0)
Hemoglobin: 10.2 g/dL — ABNORMAL LOW (ref 13.0–17.0)
MCH: 33.6 pg (ref 26.0–34.0)
MCHC: 32.9 g/dL (ref 30.0–36.0)
MCV: 102 fL — ABNORMAL HIGH (ref 80.0–100.0)
Platelets: 385 10*3/uL (ref 150–400)
RBC: 3.04 MIL/uL — ABNORMAL LOW (ref 4.22–5.81)
RDW: 14.2 % (ref 11.5–15.5)
WBC: 7.9 10*3/uL (ref 4.0–10.5)
nRBC: 0 % (ref 0.0–0.2)

## 2024-04-16 LAB — BASIC METABOLIC PANEL WITH GFR
Anion gap: 6 (ref 5–15)
BUN: 13 mg/dL (ref 8–23)
CO2: 25 mmol/L (ref 22–32)
Calcium: 8.5 mg/dL — ABNORMAL LOW (ref 8.9–10.3)
Chloride: 101 mmol/L (ref 98–111)
Creatinine, Ser: 0.62 mg/dL (ref 0.61–1.24)
GFR, Estimated: >60 mL/min (ref >60)
Glucose, Bld: 100 mg/dL — ABNORMAL HIGH (ref 70–99)
Potassium: 4 mmol/L (ref 3.5–5.1)
Sodium: 132 mmol/L — ABNORMAL LOW (ref 135–145)

## 2024-04-17 DIAGNOSIS — Y95 Nosocomial condition: Secondary | ICD-10-CM

## 2024-04-17 DIAGNOSIS — G20B2 Parkinson's disease with dyskinesia, with fluctuations: Secondary | ICD-10-CM | POA: Diagnosis not present

## 2024-04-17 DIAGNOSIS — I482 Chronic atrial fibrillation, unspecified: Secondary | ICD-10-CM | POA: Diagnosis not present

## 2024-04-17 DIAGNOSIS — Z930 Tracheostomy status: Secondary | ICD-10-CM | POA: Diagnosis not present

## 2024-04-17 DIAGNOSIS — J159 Unspecified bacterial pneumonia: Secondary | ICD-10-CM

## 2024-04-17 DIAGNOSIS — J9621 Acute and chronic respiratory failure with hypoxia: Secondary | ICD-10-CM | POA: Diagnosis not present

## 2024-04-17 LAB — BLOOD GAS, ARTERIAL
Acid-Base Excess: 2.8 mmol/L — ABNORMAL HIGH (ref 0.0–2.0)
Acid-Base Excess: 1.2 mmol/L (ref 0.0–2.0)
Bicarbonate: 27.2 mmol/L (ref 20.0–28.0)
Bicarbonate: 26 mmol/L (ref 20.0–28.0)
O2 Saturation: 99.4 %
O2 Saturation: 99.1 %
Patient temperature: 37
Patient temperature: 37
pCO2 arterial: 40 mmHg (ref 32–48)
pCO2 arterial: 41 mmHg (ref 32–48)
pH, Arterial: 7.44 (ref 7.35–7.45)
pH, Arterial: 7.41 (ref 7.35–7.45)
pO2, Arterial: 134 mmHg — ABNORMAL HIGH (ref 83–108)
pO2, Arterial: 106 mmHg (ref 83–108)

## 2024-04-18 DIAGNOSIS — Y95 Nosocomial condition: Secondary | ICD-10-CM

## 2024-04-18 DIAGNOSIS — I482 Chronic atrial fibrillation, unspecified: Secondary | ICD-10-CM | POA: Diagnosis not present

## 2024-04-18 DIAGNOSIS — G20B2 Parkinson's disease with dyskinesia, with fluctuations: Secondary | ICD-10-CM | POA: Diagnosis not present

## 2024-04-18 DIAGNOSIS — Z930 Tracheostomy status: Secondary | ICD-10-CM | POA: Diagnosis not present

## 2024-04-18 DIAGNOSIS — J9621 Acute and chronic respiratory failure with hypoxia: Secondary | ICD-10-CM | POA: Diagnosis not present

## 2024-04-18 DIAGNOSIS — J159 Unspecified bacterial pneumonia: Secondary | ICD-10-CM

## 2024-04-19 DIAGNOSIS — Y95 Nosocomial condition: Secondary | ICD-10-CM

## 2024-04-19 DIAGNOSIS — J9621 Acute and chronic respiratory failure with hypoxia: Secondary | ICD-10-CM | POA: Diagnosis not present

## 2024-04-19 DIAGNOSIS — J159 Unspecified bacterial pneumonia: Secondary | ICD-10-CM

## 2024-04-19 DIAGNOSIS — G20B2 Parkinson's disease with dyskinesia, with fluctuations: Secondary | ICD-10-CM | POA: Diagnosis not present

## 2024-04-19 DIAGNOSIS — I482 Chronic atrial fibrillation, unspecified: Secondary | ICD-10-CM | POA: Diagnosis not present

## 2024-04-19 DIAGNOSIS — Z930 Tracheostomy status: Secondary | ICD-10-CM | POA: Diagnosis not present

## 2024-04-19 LAB — CBC
HCT: 31.7 % — ABNORMAL LOW (ref 39.0–52.0)
Hemoglobin: 10.5 g/dL — ABNORMAL LOW (ref 13.0–17.0)
MCH: 33.9 pg (ref 26.0–34.0)
MCHC: 33.1 g/dL (ref 30.0–36.0)
MCV: 102.3 fL — ABNORMAL HIGH (ref 80.0–100.0)
Platelets: 368 10*3/uL (ref 150–400)
RBC: 3.1 MIL/uL — ABNORMAL LOW (ref 4.22–5.81)
RDW: 14.1 % (ref 11.5–15.5)
WBC: 7.6 10*3/uL (ref 4.0–10.5)
nRBC: 0 % (ref 0.0–0.2)

## 2024-04-19 LAB — BASIC METABOLIC PANEL WITH GFR
Anion gap: 7 (ref 5–15)
BUN: 13 mg/dL (ref 8–23)
CO2: 25 mmol/L (ref 22–32)
Calcium: 8.9 mg/dL (ref 8.9–10.3)
Chloride: 100 mmol/L (ref 98–111)
Creatinine, Ser: 0.59 mg/dL — ABNORMAL LOW (ref 0.61–1.24)
GFR, Estimated: >60 mL/min (ref >60)
Glucose, Bld: 121 mg/dL — ABNORMAL HIGH (ref 70–99)
Potassium: 3.8 mmol/L (ref 3.5–5.1)
Sodium: 132 mmol/L — ABNORMAL LOW (ref 135–145)

## 2024-04-19 LAB — MAGNESIUM: Magnesium: 1.9 mg/dL (ref 1.7–2.4)

## 2024-04-20 DIAGNOSIS — J9621 Acute and chronic respiratory failure with hypoxia: Secondary | ICD-10-CM | POA: Diagnosis not present

## 2024-04-20 DIAGNOSIS — I482 Chronic atrial fibrillation, unspecified: Secondary | ICD-10-CM | POA: Diagnosis not present

## 2024-04-20 DIAGNOSIS — G20B2 Parkinson's disease with dyskinesia, with fluctuations: Secondary | ICD-10-CM | POA: Diagnosis not present

## 2024-04-20 DIAGNOSIS — Z930 Tracheostomy status: Secondary | ICD-10-CM | POA: Diagnosis not present

## 2024-04-20 DIAGNOSIS — J159 Unspecified bacterial pneumonia: Secondary | ICD-10-CM

## 2024-04-20 DIAGNOSIS — Y95 Nosocomial condition: Secondary | ICD-10-CM

## 2024-04-21 DIAGNOSIS — G20B2 Parkinson's disease with dyskinesia, with fluctuations: Secondary | ICD-10-CM | POA: Diagnosis not present

## 2024-04-21 DIAGNOSIS — I482 Chronic atrial fibrillation, unspecified: Secondary | ICD-10-CM | POA: Diagnosis not present

## 2024-04-21 DIAGNOSIS — Z930 Tracheostomy status: Secondary | ICD-10-CM | POA: Diagnosis not present

## 2024-04-21 DIAGNOSIS — J9621 Acute and chronic respiratory failure with hypoxia: Secondary | ICD-10-CM | POA: Diagnosis not present

## 2024-04-21 DIAGNOSIS — J159 Unspecified bacterial pneumonia: Secondary | ICD-10-CM

## 2024-04-21 DIAGNOSIS — Y95 Nosocomial condition: Secondary | ICD-10-CM

## 2024-04-22 DIAGNOSIS — J9621 Acute and chronic respiratory failure with hypoxia: Secondary | ICD-10-CM | POA: Diagnosis not present

## 2024-04-22 DIAGNOSIS — G20B2 Parkinson's disease with dyskinesia, with fluctuations: Secondary | ICD-10-CM | POA: Diagnosis not present

## 2024-04-22 DIAGNOSIS — J159 Unspecified bacterial pneumonia: Secondary | ICD-10-CM

## 2024-04-22 DIAGNOSIS — Y95 Nosocomial condition: Secondary | ICD-10-CM

## 2024-04-22 DIAGNOSIS — I482 Chronic atrial fibrillation, unspecified: Secondary | ICD-10-CM | POA: Diagnosis not present

## 2024-04-22 DIAGNOSIS — Z930 Tracheostomy status: Secondary | ICD-10-CM | POA: Diagnosis not present

## 2024-04-25 DIAGNOSIS — Y95 Nosocomial condition: Secondary | ICD-10-CM

## 2024-04-25 DIAGNOSIS — J9621 Acute and chronic respiratory failure with hypoxia: Secondary | ICD-10-CM | POA: Diagnosis not present

## 2024-04-25 DIAGNOSIS — Z930 Tracheostomy status: Secondary | ICD-10-CM | POA: Diagnosis not present

## 2024-04-25 DIAGNOSIS — I482 Chronic atrial fibrillation, unspecified: Secondary | ICD-10-CM | POA: Diagnosis not present

## 2024-04-25 DIAGNOSIS — J159 Unspecified bacterial pneumonia: Secondary | ICD-10-CM

## 2024-04-25 DIAGNOSIS — G20B2 Parkinson's disease with dyskinesia, with fluctuations: Secondary | ICD-10-CM | POA: Diagnosis not present

## 2024-04-25 LAB — CBC WITH DIFFERENTIAL/PLATELET
Abs Immature Granulocytes: 0.04 10*3/uL (ref 0.00–0.07)
Basophils Absolute: 0 10*3/uL (ref 0.0–0.1)
Basophils Relative: 1 %
Eosinophils Absolute: 0.5 10*3/uL (ref 0.0–0.5)
Eosinophils Relative: 7 %
HCT: 32.6 % — ABNORMAL LOW (ref 39.0–52.0)
Hemoglobin: 10.5 g/dL — ABNORMAL LOW (ref 13.0–17.0)
Immature Granulocytes: 1 %
Lymphocytes Relative: 20 %
Lymphs Abs: 1.3 10*3/uL (ref 0.7–4.0)
MCH: 33.2 pg (ref 26.0–34.0)
MCHC: 32.2 g/dL (ref 30.0–36.0)
MCV: 103.2 fL — ABNORMAL HIGH (ref 80.0–100.0)
Monocytes Absolute: 0.9 10*3/uL (ref 0.1–1.0)
Monocytes Relative: 15 %
Neutro Abs: 3.6 10*3/uL (ref 1.7–7.7)
Neutrophils Relative %: 56 %
Platelets: 272 10*3/uL (ref 150–400)
RBC: 3.16 MIL/uL — ABNORMAL LOW (ref 4.22–5.81)
RDW: 13.7 % (ref 11.5–15.5)
WBC: 6.3 10*3/uL (ref 4.0–10.5)
nRBC: 0 % (ref 0.0–0.2)

## 2024-04-25 LAB — COMPREHENSIVE METABOLIC PANEL WITH GFR
ALT: 13 U/L (ref 0–44)
AST: 40 U/L (ref 15–41)
Albumin: 2.3 g/dL — ABNORMAL LOW (ref 3.5–5.0)
Alkaline Phosphatase: 135 U/L — ABNORMAL HIGH (ref 38–126)
Anion gap: 7 (ref 5–15)
BUN: 17 mg/dL (ref 8–23)
CO2: 26 mmol/L (ref 22–32)
Calcium: 8.7 mg/dL — ABNORMAL LOW (ref 8.9–10.3)
Chloride: 102 mmol/L (ref 98–111)
Creatinine, Ser: 0.67 mg/dL (ref 0.61–1.24)
GFR, Estimated: >60 mL/min (ref >60)
Glucose, Bld: 118 mg/dL — ABNORMAL HIGH (ref 70–99)
Potassium: 4 mmol/L (ref 3.5–5.1)
Sodium: 135 mmol/L (ref 135–145)
Total Bilirubin: 0.4 mg/dL (ref 0.0–1.2)
Total Protein: 7 g/dL (ref 6.5–8.1)

## 2024-04-26 DIAGNOSIS — G20B2 Parkinson's disease with dyskinesia, with fluctuations: Secondary | ICD-10-CM | POA: Diagnosis not present

## 2024-04-26 DIAGNOSIS — Z930 Tracheostomy status: Secondary | ICD-10-CM | POA: Diagnosis not present

## 2024-04-26 DIAGNOSIS — J9621 Acute and chronic respiratory failure with hypoxia: Secondary | ICD-10-CM | POA: Diagnosis not present

## 2024-04-26 DIAGNOSIS — I482 Chronic atrial fibrillation, unspecified: Secondary | ICD-10-CM | POA: Diagnosis not present

## 2024-04-26 DIAGNOSIS — J159 Unspecified bacterial pneumonia: Secondary | ICD-10-CM

## 2024-04-26 DIAGNOSIS — Y95 Nosocomial condition: Secondary | ICD-10-CM

## 2024-04-27 DIAGNOSIS — G20B2 Parkinson's disease with dyskinesia, with fluctuations: Secondary | ICD-10-CM | POA: Diagnosis not present

## 2024-04-27 DIAGNOSIS — I482 Chronic atrial fibrillation, unspecified: Secondary | ICD-10-CM | POA: Diagnosis not present

## 2024-04-27 DIAGNOSIS — Y95 Nosocomial condition: Secondary | ICD-10-CM

## 2024-04-27 DIAGNOSIS — Z930 Tracheostomy status: Secondary | ICD-10-CM | POA: Diagnosis not present

## 2024-04-27 DIAGNOSIS — J159 Unspecified bacterial pneumonia: Secondary | ICD-10-CM

## 2024-04-27 DIAGNOSIS — J9621 Acute and chronic respiratory failure with hypoxia: Secondary | ICD-10-CM | POA: Diagnosis not present

## 2024-04-28 DIAGNOSIS — J9621 Acute and chronic respiratory failure with hypoxia: Secondary | ICD-10-CM | POA: Diagnosis not present

## 2024-04-28 DIAGNOSIS — Y95 Nosocomial condition: Secondary | ICD-10-CM

## 2024-04-28 DIAGNOSIS — Z930 Tracheostomy status: Secondary | ICD-10-CM | POA: Diagnosis not present

## 2024-04-28 DIAGNOSIS — G20B2 Parkinson's disease with dyskinesia, with fluctuations: Secondary | ICD-10-CM | POA: Diagnosis not present

## 2024-04-28 DIAGNOSIS — J159 Unspecified bacterial pneumonia: Secondary | ICD-10-CM

## 2024-04-28 DIAGNOSIS — I482 Chronic atrial fibrillation, unspecified: Secondary | ICD-10-CM | POA: Diagnosis not present

## 2024-04-29 DIAGNOSIS — I482 Chronic atrial fibrillation, unspecified: Secondary | ICD-10-CM | POA: Diagnosis not present

## 2024-04-29 DIAGNOSIS — Y95 Nosocomial condition: Secondary | ICD-10-CM

## 2024-04-29 DIAGNOSIS — J9621 Acute and chronic respiratory failure with hypoxia: Secondary | ICD-10-CM | POA: Diagnosis not present

## 2024-04-29 DIAGNOSIS — Z930 Tracheostomy status: Secondary | ICD-10-CM | POA: Diagnosis not present

## 2024-04-29 DIAGNOSIS — J159 Unspecified bacterial pneumonia: Secondary | ICD-10-CM

## 2024-04-29 DIAGNOSIS — G20B2 Parkinson's disease with dyskinesia, with fluctuations: Secondary | ICD-10-CM | POA: Diagnosis not present

## 2024-05-01 LAB — CBC WITH DIFFERENTIAL/PLATELET
Abs Immature Granulocytes: 0.02 10*3/uL (ref 0.00–0.07)
Basophils Absolute: 0 10*3/uL (ref 0.0–0.1)
Basophils Relative: 0 %
Eosinophils Absolute: 0.4 10*3/uL (ref 0.0–0.5)
Eosinophils Relative: 6 %
HCT: 35.4 % — ABNORMAL LOW (ref 39.0–52.0)
Hemoglobin: 11.7 g/dL — ABNORMAL LOW (ref 13.0–17.0)
Immature Granulocytes: 0 %
Lymphocytes Relative: 18 %
Lymphs Abs: 1.2 10*3/uL (ref 0.7–4.0)
MCH: 33.2 pg (ref 26.0–34.0)
MCHC: 33.1 g/dL (ref 30.0–36.0)
MCV: 100.6 fL — ABNORMAL HIGH (ref 80.0–100.0)
Monocytes Absolute: 0.9 10*3/uL (ref 0.1–1.0)
Monocytes Relative: 13 %
Neutro Abs: 4.2 10*3/uL (ref 1.7–7.7)
Neutrophils Relative %: 63 %
Platelets: 253 10*3/uL (ref 150–400)
RBC: 3.52 MIL/uL — ABNORMAL LOW (ref 4.22–5.81)
RDW: 13.7 % (ref 11.5–15.5)
WBC: 6.7 10*3/uL (ref 4.0–10.5)
nRBC: 0 % (ref 0.0–0.2)

## 2024-05-01 LAB — BASIC METABOLIC PANEL WITH GFR
Anion gap: 11 (ref 5–15)
BUN: 18 mg/dL (ref 8–23)
CO2: 22 mmol/L (ref 22–32)
Calcium: 8.9 mg/dL (ref 8.9–10.3)
Chloride: 101 mmol/L (ref 98–111)
Creatinine, Ser: 0.71 mg/dL (ref 0.61–1.24)
GFR, Estimated: >60 mL/min (ref >60)
Glucose, Bld: 116 mg/dL — ABNORMAL HIGH (ref 70–99)
Potassium: 3.9 mmol/L (ref 3.5–5.1)
Sodium: 134 mmol/L — ABNORMAL LOW (ref 135–145)

## 2024-05-02 DIAGNOSIS — Z930 Tracheostomy status: Secondary | ICD-10-CM | POA: Diagnosis not present

## 2024-05-02 DIAGNOSIS — Y95 Nosocomial condition: Secondary | ICD-10-CM

## 2024-05-02 DIAGNOSIS — I482 Chronic atrial fibrillation, unspecified: Secondary | ICD-10-CM | POA: Diagnosis not present

## 2024-05-02 DIAGNOSIS — J159 Unspecified bacterial pneumonia: Secondary | ICD-10-CM

## 2024-05-02 DIAGNOSIS — J9621 Acute and chronic respiratory failure with hypoxia: Secondary | ICD-10-CM | POA: Diagnosis not present

## 2024-05-02 DIAGNOSIS — G20B2 Parkinson's disease with dyskinesia, with fluctuations: Secondary | ICD-10-CM | POA: Diagnosis not present

## 2024-05-03 DIAGNOSIS — Y95 Nosocomial condition: Secondary | ICD-10-CM

## 2024-05-03 DIAGNOSIS — J9621 Acute and chronic respiratory failure with hypoxia: Secondary | ICD-10-CM | POA: Diagnosis not present

## 2024-05-03 DIAGNOSIS — Z930 Tracheostomy status: Secondary | ICD-10-CM | POA: Diagnosis not present

## 2024-05-03 DIAGNOSIS — G20B2 Parkinson's disease with dyskinesia, with fluctuations: Secondary | ICD-10-CM | POA: Diagnosis not present

## 2024-05-03 DIAGNOSIS — J159 Unspecified bacterial pneumonia: Secondary | ICD-10-CM

## 2024-05-03 DIAGNOSIS — I482 Chronic atrial fibrillation, unspecified: Secondary | ICD-10-CM | POA: Diagnosis not present

## 2024-05-04 DIAGNOSIS — Y95 Nosocomial condition: Secondary | ICD-10-CM

## 2024-05-04 DIAGNOSIS — J159 Unspecified bacterial pneumonia: Secondary | ICD-10-CM

## 2024-05-04 DIAGNOSIS — Z930 Tracheostomy status: Secondary | ICD-10-CM | POA: Diagnosis not present

## 2024-05-04 DIAGNOSIS — J9621 Acute and chronic respiratory failure with hypoxia: Secondary | ICD-10-CM | POA: Diagnosis not present

## 2024-05-04 DIAGNOSIS — G20B2 Parkinson's disease with dyskinesia, with fluctuations: Secondary | ICD-10-CM | POA: Diagnosis not present

## 2024-05-04 DIAGNOSIS — I482 Chronic atrial fibrillation, unspecified: Secondary | ICD-10-CM | POA: Diagnosis not present

## 2024-05-05 DIAGNOSIS — J159 Unspecified bacterial pneumonia: Secondary | ICD-10-CM

## 2024-05-05 DIAGNOSIS — J9621 Acute and chronic respiratory failure with hypoxia: Secondary | ICD-10-CM | POA: Diagnosis not present

## 2024-05-05 DIAGNOSIS — Z930 Tracheostomy status: Secondary | ICD-10-CM | POA: Diagnosis not present

## 2024-05-05 DIAGNOSIS — I482 Chronic atrial fibrillation, unspecified: Secondary | ICD-10-CM | POA: Diagnosis not present

## 2024-05-05 DIAGNOSIS — Y95 Nosocomial condition: Secondary | ICD-10-CM

## 2024-05-05 DIAGNOSIS — G20B2 Parkinson's disease with dyskinesia, with fluctuations: Secondary | ICD-10-CM | POA: Diagnosis not present

## 2024-05-06 DIAGNOSIS — G20B2 Parkinson's disease with dyskinesia, with fluctuations: Secondary | ICD-10-CM | POA: Diagnosis not present

## 2024-05-06 DIAGNOSIS — I482 Chronic atrial fibrillation, unspecified: Secondary | ICD-10-CM | POA: Diagnosis not present

## 2024-05-06 DIAGNOSIS — J9621 Acute and chronic respiratory failure with hypoxia: Secondary | ICD-10-CM | POA: Diagnosis not present

## 2024-05-06 DIAGNOSIS — Z930 Tracheostomy status: Secondary | ICD-10-CM | POA: Diagnosis not present

## 2024-05-06 DIAGNOSIS — Y95 Nosocomial condition: Secondary | ICD-10-CM

## 2024-05-06 DIAGNOSIS — J159 Unspecified bacterial pneumonia: Secondary | ICD-10-CM

## 2024-05-07 DIAGNOSIS — Y95 Nosocomial condition: Secondary | ICD-10-CM

## 2024-05-07 DIAGNOSIS — I482 Chronic atrial fibrillation, unspecified: Secondary | ICD-10-CM | POA: Diagnosis not present

## 2024-05-07 DIAGNOSIS — J159 Unspecified bacterial pneumonia: Secondary | ICD-10-CM

## 2024-05-07 DIAGNOSIS — J9621 Acute and chronic respiratory failure with hypoxia: Secondary | ICD-10-CM | POA: Diagnosis not present

## 2024-05-07 DIAGNOSIS — Z930 Tracheostomy status: Secondary | ICD-10-CM | POA: Diagnosis not present

## 2024-05-07 DIAGNOSIS — G20B2 Parkinson's disease with dyskinesia, with fluctuations: Secondary | ICD-10-CM | POA: Diagnosis not present

## 2024-05-07 LAB — CBC
HCT: 37.4 % — ABNORMAL LOW (ref 39.0–52.0)
Hemoglobin: 12 g/dL — ABNORMAL LOW (ref 13.0–17.0)
MCH: 32.6 pg (ref 26.0–34.0)
MCHC: 32.1 g/dL (ref 30.0–36.0)
MCV: 101.6 fL — ABNORMAL HIGH (ref 80.0–100.0)
Platelets: 227 10*3/uL (ref 150–400)
RBC: 3.68 MIL/uL — ABNORMAL LOW (ref 4.22–5.81)
RDW: 13.2 % (ref 11.5–15.5)
WBC: 5.1 10*3/uL (ref 4.0–10.5)
nRBC: 0 % (ref 0.0–0.2)

## 2024-05-07 LAB — BASIC METABOLIC PANEL WITH GFR
Anion gap: 9 (ref 5–15)
BUN: 17 mg/dL (ref 8–23)
CO2: 25 mmol/L (ref 22–32)
Calcium: 9.2 mg/dL (ref 8.9–10.3)
Chloride: 102 mmol/L (ref 98–111)
Creatinine, Ser: 0.63 mg/dL (ref 0.61–1.24)
GFR, Estimated: >60 mL/min (ref >60)
Glucose, Bld: 111 mg/dL — ABNORMAL HIGH (ref 70–99)
Potassium: 3.7 mmol/L (ref 3.5–5.1)
Sodium: 136 mmol/L (ref 135–145)

## 2024-05-07 LAB — MAGNESIUM: Magnesium: 2 mg/dL (ref 1.7–2.4)

## 2024-05-09 DIAGNOSIS — J159 Unspecified bacterial pneumonia: Secondary | ICD-10-CM

## 2024-05-09 DIAGNOSIS — Z930 Tracheostomy status: Secondary | ICD-10-CM | POA: Diagnosis not present

## 2024-05-09 DIAGNOSIS — G20B2 Parkinson's disease with dyskinesia, with fluctuations: Secondary | ICD-10-CM | POA: Diagnosis not present

## 2024-05-09 DIAGNOSIS — Y95 Nosocomial condition: Secondary | ICD-10-CM

## 2024-05-09 DIAGNOSIS — I482 Chronic atrial fibrillation, unspecified: Secondary | ICD-10-CM | POA: Diagnosis not present

## 2024-05-09 DIAGNOSIS — J9621 Acute and chronic respiratory failure with hypoxia: Secondary | ICD-10-CM | POA: Diagnosis not present

## 2024-05-10 DIAGNOSIS — J9621 Acute and chronic respiratory failure with hypoxia: Secondary | ICD-10-CM | POA: Diagnosis not present

## 2024-05-10 DIAGNOSIS — Y95 Nosocomial condition: Secondary | ICD-10-CM

## 2024-05-10 DIAGNOSIS — G20B2 Parkinson's disease with dyskinesia, with fluctuations: Secondary | ICD-10-CM | POA: Diagnosis not present

## 2024-05-10 DIAGNOSIS — J159 Unspecified bacterial pneumonia: Secondary | ICD-10-CM

## 2024-05-10 DIAGNOSIS — I482 Chronic atrial fibrillation, unspecified: Secondary | ICD-10-CM | POA: Diagnosis not present

## 2024-05-10 DIAGNOSIS — Z930 Tracheostomy status: Secondary | ICD-10-CM | POA: Diagnosis not present

## 2024-05-11 DIAGNOSIS — J9621 Acute and chronic respiratory failure with hypoxia: Secondary | ICD-10-CM | POA: Diagnosis not present

## 2024-05-11 DIAGNOSIS — G20B2 Parkinson's disease with dyskinesia, with fluctuations: Secondary | ICD-10-CM | POA: Diagnosis not present

## 2024-05-11 DIAGNOSIS — I482 Chronic atrial fibrillation, unspecified: Secondary | ICD-10-CM | POA: Diagnosis not present

## 2024-05-11 DIAGNOSIS — Z930 Tracheostomy status: Secondary | ICD-10-CM | POA: Diagnosis not present

## 2024-05-11 DIAGNOSIS — Y95 Nosocomial condition: Secondary | ICD-10-CM

## 2024-05-11 DIAGNOSIS — J159 Unspecified bacterial pneumonia: Secondary | ICD-10-CM

## 2024-05-12 DIAGNOSIS — I482 Chronic atrial fibrillation, unspecified: Secondary | ICD-10-CM | POA: Diagnosis not present

## 2024-05-12 DIAGNOSIS — J9621 Acute and chronic respiratory failure with hypoxia: Secondary | ICD-10-CM | POA: Diagnosis not present

## 2024-05-12 DIAGNOSIS — J159 Unspecified bacterial pneumonia: Secondary | ICD-10-CM

## 2024-05-12 DIAGNOSIS — Y95 Nosocomial condition: Secondary | ICD-10-CM

## 2024-05-12 DIAGNOSIS — G20B2 Parkinson's disease with dyskinesia, with fluctuations: Secondary | ICD-10-CM | POA: Diagnosis not present

## 2024-05-12 DIAGNOSIS — Z930 Tracheostomy status: Secondary | ICD-10-CM | POA: Diagnosis not present

## 2024-05-13 DIAGNOSIS — J159 Unspecified bacterial pneumonia: Secondary | ICD-10-CM

## 2024-05-13 DIAGNOSIS — Z930 Tracheostomy status: Secondary | ICD-10-CM | POA: Diagnosis not present

## 2024-05-13 DIAGNOSIS — G20B2 Parkinson's disease with dyskinesia, with fluctuations: Secondary | ICD-10-CM | POA: Diagnosis not present

## 2024-05-13 DIAGNOSIS — I482 Chronic atrial fibrillation, unspecified: Secondary | ICD-10-CM | POA: Diagnosis not present

## 2024-05-13 DIAGNOSIS — Y95 Nosocomial condition: Secondary | ICD-10-CM

## 2024-05-13 DIAGNOSIS — J9621 Acute and chronic respiratory failure with hypoxia: Secondary | ICD-10-CM | POA: Diagnosis not present

## 2024-05-14 DIAGNOSIS — Z930 Tracheostomy status: Secondary | ICD-10-CM | POA: Diagnosis not present

## 2024-05-14 DIAGNOSIS — G20B2 Parkinson's disease with dyskinesia, with fluctuations: Secondary | ICD-10-CM | POA: Diagnosis not present

## 2024-05-14 DIAGNOSIS — J159 Unspecified bacterial pneumonia: Secondary | ICD-10-CM

## 2024-05-14 DIAGNOSIS — I482 Chronic atrial fibrillation, unspecified: Secondary | ICD-10-CM | POA: Diagnosis not present

## 2024-05-14 DIAGNOSIS — J9621 Acute and chronic respiratory failure with hypoxia: Secondary | ICD-10-CM | POA: Diagnosis not present

## 2024-05-14 DIAGNOSIS — Y95 Nosocomial condition: Secondary | ICD-10-CM

## 2024-05-16 LAB — BASIC METABOLIC PANEL WITH GFR
Anion gap: 7 (ref 5–15)
BUN: 18 mg/dL (ref 8–23)
CO2: 25 mmol/L (ref 22–32)
Calcium: 8.6 mg/dL — ABNORMAL LOW (ref 8.9–10.3)
Chloride: 104 mmol/L (ref 98–111)
Creatinine, Ser: 0.74 mg/dL (ref 0.61–1.24)
GFR, Estimated: >60 mL/min (ref >60)
Glucose, Bld: 93 mg/dL (ref 70–99)
Potassium: 3.4 mmol/L — ABNORMAL LOW (ref 3.5–5.1)
Sodium: 136 mmol/L (ref 135–145)

## 2024-05-16 LAB — CBC WITH DIFFERENTIAL/PLATELET
Abs Immature Granulocytes: 0.07 10*3/uL (ref 0.00–0.07)
Basophils Absolute: 0 10*3/uL (ref 0.0–0.1)
Basophils Relative: 1 %
Eosinophils Absolute: 0.3 10*3/uL (ref 0.0–0.5)
Eosinophils Relative: 5 %
HCT: 35.1 % — ABNORMAL LOW (ref 39.0–52.0)
Hemoglobin: 11.3 g/dL — ABNORMAL LOW (ref 13.0–17.0)
Immature Granulocytes: 1 %
Lymphocytes Relative: 22 %
Lymphs Abs: 1.4 10*3/uL (ref 0.7–4.0)
MCH: 32.7 pg (ref 26.0–34.0)
MCHC: 32.2 g/dL (ref 30.0–36.0)
MCV: 101.4 fL — ABNORMAL HIGH (ref 80.0–100.0)
Monocytes Absolute: 1.1 10*3/uL — ABNORMAL HIGH (ref 0.1–1.0)
Monocytes Relative: 18 %
Neutro Abs: 3.3 10*3/uL (ref 1.7–7.7)
Neutrophils Relative %: 53 %
Platelets: 212 10*3/uL (ref 150–400)
RBC: 3.46 MIL/uL — ABNORMAL LOW (ref 4.22–5.81)
RDW: 13.3 % (ref 11.5–15.5)
WBC: 6.1 10*3/uL (ref 4.0–10.5)
nRBC: 0 % (ref 0.0–0.2)

## 2024-05-18 DIAGNOSIS — Z930 Tracheostomy status: Secondary | ICD-10-CM | POA: Diagnosis not present

## 2024-05-18 DIAGNOSIS — J159 Unspecified bacterial pneumonia: Secondary | ICD-10-CM

## 2024-05-18 DIAGNOSIS — Y95 Nosocomial condition: Secondary | ICD-10-CM

## 2024-05-18 DIAGNOSIS — I482 Chronic atrial fibrillation, unspecified: Secondary | ICD-10-CM | POA: Diagnosis not present

## 2024-05-18 DIAGNOSIS — G20B2 Parkinson's disease with dyskinesia, with fluctuations: Secondary | ICD-10-CM | POA: Diagnosis not present

## 2024-05-18 DIAGNOSIS — J9621 Acute and chronic respiratory failure with hypoxia: Secondary | ICD-10-CM | POA: Diagnosis not present

## 2024-05-19 ENCOUNTER — Institutional Professional Consult (permissible substitution) (HOSPITAL_COMMUNITY)

## 2024-05-19 DIAGNOSIS — J9621 Acute and chronic respiratory failure with hypoxia: Secondary | ICD-10-CM | POA: Diagnosis not present

## 2024-05-19 DIAGNOSIS — G20B2 Parkinson's disease with dyskinesia, with fluctuations: Secondary | ICD-10-CM | POA: Diagnosis not present

## 2024-05-19 DIAGNOSIS — Z930 Tracheostomy status: Secondary | ICD-10-CM | POA: Diagnosis not present

## 2024-05-19 DIAGNOSIS — J159 Unspecified bacterial pneumonia: Secondary | ICD-10-CM

## 2024-05-19 DIAGNOSIS — I482 Chronic atrial fibrillation, unspecified: Secondary | ICD-10-CM | POA: Diagnosis not present

## 2024-05-19 DIAGNOSIS — Y95 Nosocomial condition: Secondary | ICD-10-CM

## 2024-05-20 DIAGNOSIS — J9621 Acute and chronic respiratory failure with hypoxia: Secondary | ICD-10-CM | POA: Diagnosis not present

## 2024-05-20 DIAGNOSIS — G20B2 Parkinson's disease with dyskinesia, with fluctuations: Secondary | ICD-10-CM | POA: Diagnosis not present

## 2024-05-20 DIAGNOSIS — Z930 Tracheostomy status: Secondary | ICD-10-CM | POA: Diagnosis not present

## 2024-05-20 DIAGNOSIS — J159 Unspecified bacterial pneumonia: Secondary | ICD-10-CM

## 2024-05-20 DIAGNOSIS — Y95 Nosocomial condition: Secondary | ICD-10-CM

## 2024-05-20 DIAGNOSIS — I482 Chronic atrial fibrillation, unspecified: Secondary | ICD-10-CM | POA: Diagnosis not present

## 2024-05-20 LAB — BASIC METABOLIC PANEL WITH GFR
Anion gap: 7 (ref 5–15)
BUN: 13 mg/dL (ref 8–23)
CO2: 27 mmol/L (ref 22–32)
Calcium: 8.6 mg/dL — ABNORMAL LOW (ref 8.9–10.3)
Chloride: 101 mmol/L (ref 98–111)
Creatinine, Ser: 0.82 mg/dL (ref 0.61–1.24)
GFR, Estimated: >60 mL/min (ref >60)
Glucose, Bld: 93 mg/dL (ref 70–99)
Potassium: 4.1 mmol/L (ref 3.5–5.1)
Sodium: 135 mmol/L (ref 135–145)

## 2024-05-20 LAB — CBC
HCT: 37.5 % — ABNORMAL LOW (ref 39.0–52.0)
Hemoglobin: 12.3 g/dL — ABNORMAL LOW (ref 13.0–17.0)
MCH: 32.8 pg (ref 26.0–34.0)
MCHC: 32.8 g/dL (ref 30.0–36.0)
MCV: 100 fL (ref 80.0–100.0)
Platelets: 235 10*3/uL (ref 150–400)
RBC: 3.75 MIL/uL — ABNORMAL LOW (ref 4.22–5.81)
RDW: 13.3 % (ref 11.5–15.5)
WBC: 6.4 10*3/uL (ref 4.0–10.5)
nRBC: 0 % (ref 0.0–0.2)

## 2024-05-21 DIAGNOSIS — I482 Chronic atrial fibrillation, unspecified: Secondary | ICD-10-CM | POA: Diagnosis not present

## 2024-05-21 DIAGNOSIS — Y95 Nosocomial condition: Secondary | ICD-10-CM

## 2024-05-21 DIAGNOSIS — J9621 Acute and chronic respiratory failure with hypoxia: Secondary | ICD-10-CM | POA: Diagnosis not present

## 2024-05-21 DIAGNOSIS — G20B2 Parkinson's disease with dyskinesia, with fluctuations: Secondary | ICD-10-CM | POA: Diagnosis not present

## 2024-05-21 DIAGNOSIS — Z930 Tracheostomy status: Secondary | ICD-10-CM | POA: Diagnosis not present

## 2024-05-21 DIAGNOSIS — J159 Unspecified bacterial pneumonia: Secondary | ICD-10-CM

## 2024-05-22 DIAGNOSIS — Y95 Nosocomial condition: Secondary | ICD-10-CM

## 2024-05-22 DIAGNOSIS — G20B2 Parkinson's disease with dyskinesia, with fluctuations: Secondary | ICD-10-CM | POA: Diagnosis not present

## 2024-05-22 DIAGNOSIS — Z930 Tracheostomy status: Secondary | ICD-10-CM | POA: Diagnosis not present

## 2024-05-22 DIAGNOSIS — J159 Unspecified bacterial pneumonia: Secondary | ICD-10-CM

## 2024-05-22 DIAGNOSIS — J9621 Acute and chronic respiratory failure with hypoxia: Secondary | ICD-10-CM | POA: Diagnosis not present

## 2024-05-22 DIAGNOSIS — I482 Chronic atrial fibrillation, unspecified: Secondary | ICD-10-CM | POA: Diagnosis not present

## 2024-05-23 DIAGNOSIS — G20B2 Parkinson's disease with dyskinesia, with fluctuations: Secondary | ICD-10-CM | POA: Diagnosis not present

## 2024-05-23 DIAGNOSIS — J159 Unspecified bacterial pneumonia: Secondary | ICD-10-CM

## 2024-05-23 DIAGNOSIS — Y95 Nosocomial condition: Secondary | ICD-10-CM

## 2024-05-23 DIAGNOSIS — Z930 Tracheostomy status: Secondary | ICD-10-CM | POA: Diagnosis not present

## 2024-05-23 DIAGNOSIS — J9621 Acute and chronic respiratory failure with hypoxia: Secondary | ICD-10-CM | POA: Diagnosis not present

## 2024-05-23 DIAGNOSIS — I482 Chronic atrial fibrillation, unspecified: Secondary | ICD-10-CM | POA: Diagnosis not present

## 2024-05-23 LAB — CULTURE, RESPIRATORY W GRAM STAIN

## 2024-05-24 DIAGNOSIS — J9621 Acute and chronic respiratory failure with hypoxia: Secondary | ICD-10-CM | POA: Diagnosis not present

## 2024-05-24 DIAGNOSIS — J159 Unspecified bacterial pneumonia: Secondary | ICD-10-CM

## 2024-05-24 DIAGNOSIS — Y95 Nosocomial condition: Secondary | ICD-10-CM

## 2024-05-24 DIAGNOSIS — G20B2 Parkinson's disease with dyskinesia, with fluctuations: Secondary | ICD-10-CM | POA: Diagnosis not present

## 2024-05-24 DIAGNOSIS — I482 Chronic atrial fibrillation, unspecified: Secondary | ICD-10-CM | POA: Diagnosis not present

## 2024-05-24 DIAGNOSIS — Z930 Tracheostomy status: Secondary | ICD-10-CM | POA: Diagnosis not present

## 2024-05-25 DIAGNOSIS — Z930 Tracheostomy status: Secondary | ICD-10-CM | POA: Diagnosis not present

## 2024-05-25 DIAGNOSIS — G20B2 Parkinson's disease with dyskinesia, with fluctuations: Secondary | ICD-10-CM | POA: Diagnosis not present

## 2024-05-25 DIAGNOSIS — J159 Unspecified bacterial pneumonia: Secondary | ICD-10-CM

## 2024-05-25 DIAGNOSIS — J9621 Acute and chronic respiratory failure with hypoxia: Secondary | ICD-10-CM | POA: Diagnosis not present

## 2024-05-25 DIAGNOSIS — Y95 Nosocomial condition: Secondary | ICD-10-CM

## 2024-05-25 DIAGNOSIS — I482 Chronic atrial fibrillation, unspecified: Secondary | ICD-10-CM | POA: Diagnosis not present

## 2024-05-26 DIAGNOSIS — J690 Pneumonitis due to inhalation of food and vomit: Secondary | ICD-10-CM

## 2024-05-26 DIAGNOSIS — I468 Cardiac arrest due to other underlying condition: Secondary | ICD-10-CM | POA: Diagnosis not present

## 2024-05-26 DIAGNOSIS — R1311 Dysphagia, oral phase: Secondary | ICD-10-CM | POA: Diagnosis not present

## 2024-05-26 DIAGNOSIS — J9621 Acute and chronic respiratory failure with hypoxia: Secondary | ICD-10-CM | POA: Diagnosis not present

## 2024-05-26 DIAGNOSIS — Z930 Tracheostomy status: Secondary | ICD-10-CM | POA: Diagnosis not present

## 2024-05-27 DIAGNOSIS — Z930 Tracheostomy status: Secondary | ICD-10-CM | POA: Diagnosis not present

## 2024-05-27 DIAGNOSIS — G20B2 Parkinson's disease with dyskinesia, with fluctuations: Secondary | ICD-10-CM | POA: Diagnosis not present

## 2024-05-27 DIAGNOSIS — I482 Chronic atrial fibrillation, unspecified: Secondary | ICD-10-CM | POA: Diagnosis not present

## 2024-05-27 DIAGNOSIS — J9621 Acute and chronic respiratory failure with hypoxia: Secondary | ICD-10-CM | POA: Diagnosis not present

## 2024-05-27 DIAGNOSIS — Y95 Nosocomial condition: Secondary | ICD-10-CM

## 2024-05-27 DIAGNOSIS — J159 Unspecified bacterial pneumonia: Secondary | ICD-10-CM

## 2024-05-28 DIAGNOSIS — J159 Unspecified bacterial pneumonia: Secondary | ICD-10-CM

## 2024-05-28 DIAGNOSIS — Z930 Tracheostomy status: Secondary | ICD-10-CM | POA: Diagnosis not present

## 2024-05-28 DIAGNOSIS — Y95 Nosocomial condition: Secondary | ICD-10-CM

## 2024-05-28 DIAGNOSIS — G20B2 Parkinson's disease with dyskinesia, with fluctuations: Secondary | ICD-10-CM | POA: Diagnosis not present

## 2024-05-28 DIAGNOSIS — J9621 Acute and chronic respiratory failure with hypoxia: Secondary | ICD-10-CM | POA: Diagnosis not present

## 2024-05-28 DIAGNOSIS — I482 Chronic atrial fibrillation, unspecified: Secondary | ICD-10-CM | POA: Diagnosis not present

## 2024-05-29 DIAGNOSIS — J159 Unspecified bacterial pneumonia: Secondary | ICD-10-CM

## 2024-05-29 DIAGNOSIS — I482 Chronic atrial fibrillation, unspecified: Secondary | ICD-10-CM | POA: Diagnosis not present

## 2024-05-29 DIAGNOSIS — Z930 Tracheostomy status: Secondary | ICD-10-CM | POA: Diagnosis not present

## 2024-05-29 DIAGNOSIS — Y95 Nosocomial condition: Secondary | ICD-10-CM

## 2024-05-29 DIAGNOSIS — G20B2 Parkinson's disease with dyskinesia, with fluctuations: Secondary | ICD-10-CM | POA: Diagnosis not present

## 2024-05-29 DIAGNOSIS — J9621 Acute and chronic respiratory failure with hypoxia: Secondary | ICD-10-CM | POA: Diagnosis not present

## 2024-06-03 DIAGNOSIS — G20C Parkinsonism, unspecified: Secondary | ICD-10-CM | POA: Diagnosis not present

## 2024-06-03 DIAGNOSIS — Z515 Encounter for palliative care: Secondary | ICD-10-CM | POA: Diagnosis not present

## 2024-06-21 ENCOUNTER — Telehealth: Payer: Self-pay | Admitting: Urology

## 2024-06-21 NOTE — Telephone Encounter (Signed)
 Ms Para called they need a ordered fax to [facility] to change Mr Cory Pacheco cath at the house because he is not able to be brought into the office right now.  Nurse is to come out Tuesday  06/28/24 to change cath.   Put to attention Mylinda Moats   Fax number is [phone]

## 2024-06-21 NOTE — Telephone Encounter (Signed)
 Order faxed.

## 2024-06-23 ENCOUNTER — Telehealth: Payer: Self-pay

## 2024-06-23 NOTE — Telephone Encounter (Signed)
 Copied from CRM [id number]. Topic: Clinical - Medication Question >> Jun 22, 2024  5:01 PM Rozanna MATSU wrote: Reason for CRM: PT SPOUSE MARGARET [phone] CALLED ABOUT HIS APPT STATED HE IS BED WRITTEN AND THE WHEEL CHAIR IS LIKE A BED AND A LIFT IS UNDER HIM AND WANTED TO KNOW IF THIS WOULD BE OK FOR HIS VISIT. THE PT USES AMBULATORY TRANSPORTATION. SHE WOULD LIKE A CALL BACK TO CONFIRM THIS IS OK.  ATC x1 regarding his spouse question and Yes as long as he is able to get through the door we are able to see any pt.

## 2024-06-27 DIAGNOSIS — G40909 Epilepsy, unspecified, not intractable, without status epilepticus: Secondary | ICD-10-CM | POA: Diagnosis not present

## 2024-06-27 DIAGNOSIS — Z7409 Other reduced mobility: Secondary | ICD-10-CM | POA: Diagnosis not present

## 2024-06-27 DIAGNOSIS — F3132 Bipolar disorder, current episode depressed, moderate: Secondary | ICD-10-CM | POA: Diagnosis not present

## 2024-06-27 DIAGNOSIS — K5909 Other constipation: Secondary | ICD-10-CM | POA: Diagnosis not present

## 2024-06-27 DIAGNOSIS — I4821 Permanent atrial fibrillation: Secondary | ICD-10-CM | POA: Diagnosis not present

## 2024-06-27 DIAGNOSIS — J069 Acute upper respiratory infection, unspecified: Secondary | ICD-10-CM | POA: Diagnosis not present

## 2024-06-27 DIAGNOSIS — G20A1 Parkinson's disease without dyskinesia, without mention of fluctuations: Secondary | ICD-10-CM | POA: Diagnosis not present

## 2024-06-27 DIAGNOSIS — K219 Gastro-esophageal reflux disease without esophagitis: Secondary | ICD-10-CM | POA: Diagnosis not present

## 2024-06-27 DIAGNOSIS — I10 Essential (primary) hypertension: Secondary | ICD-10-CM | POA: Diagnosis not present

## 2024-06-27 DIAGNOSIS — J301 Allergic rhinitis due to pollen: Secondary | ICD-10-CM | POA: Diagnosis not present

## 2024-06-27 DIAGNOSIS — Z789 Other specified health status: Secondary | ICD-10-CM | POA: Diagnosis not present

## 2024-06-29 DIAGNOSIS — E7849 Other hyperlipidemia: Secondary | ICD-10-CM | POA: Diagnosis not present

## 2024-06-29 DIAGNOSIS — M4810 Ankylosing hyperostosis [Forestier], site unspecified: Secondary | ICD-10-CM | POA: Diagnosis not present

## 2024-06-29 DIAGNOSIS — F209 Schizophrenia, unspecified: Secondary | ICD-10-CM | POA: Diagnosis not present

## 2024-06-29 DIAGNOSIS — I4821 Permanent atrial fibrillation: Secondary | ICD-10-CM | POA: Diagnosis not present

## 2024-06-30 ENCOUNTER — Emergency Department (HOSPITAL_COMMUNITY)

## 2024-06-30 ENCOUNTER — Telehealth (INDEPENDENT_AMBULATORY_CARE_PROVIDER_SITE_OTHER): Payer: Self-pay

## 2024-06-30 ENCOUNTER — Encounter (HOSPITAL_COMMUNITY): Payer: Self-pay

## 2024-06-30 ENCOUNTER — Other Ambulatory Visit: Payer: Self-pay

## 2024-06-30 ENCOUNTER — Inpatient Hospital Stay (HOSPITAL_COMMUNITY)
Admission: EM | Admit: 2024-06-30 | Discharge: 2024-07-06 | DRG: 698 | Disposition: A | Discharge status: Home Health | Source: Other Acute Inpatient Hospital | Attending: Family Medicine | Admitting: Family Medicine

## 2024-06-30 DIAGNOSIS — Z1624 Resistance to multiple antibiotics: Secondary | ICD-10-CM | POA: Diagnosis present

## 2024-06-30 DIAGNOSIS — Z880 Allergy status to penicillin: Secondary | ICD-10-CM | POA: Diagnosis not present

## 2024-06-30 DIAGNOSIS — B965 Pseudomonas (aeruginosa) (mallei) (pseudomallei) as the cause of diseases classified elsewhere: Secondary | ICD-10-CM | POA: Diagnosis present

## 2024-06-30 DIAGNOSIS — I480 Paroxysmal atrial fibrillation: Secondary | ICD-10-CM | POA: Diagnosis present

## 2024-06-30 DIAGNOSIS — R5381 Other malaise: Secondary | ICD-10-CM | POA: Diagnosis present

## 2024-06-30 DIAGNOSIS — G9341 Metabolic encephalopathy: Secondary | ICD-10-CM | POA: Diagnosis present

## 2024-06-30 DIAGNOSIS — R1310 Dysphagia, unspecified: Secondary | ICD-10-CM

## 2024-06-30 DIAGNOSIS — G20B1 Parkinson's disease with dyskinesia, without mention of fluctuations: Secondary | ICD-10-CM | POA: Diagnosis present

## 2024-06-30 DIAGNOSIS — Z930 Tracheostomy status: Secondary | ICD-10-CM

## 2024-06-30 DIAGNOSIS — Z7189 Other specified counseling: Secondary | ICD-10-CM | POA: Diagnosis not present

## 2024-06-30 DIAGNOSIS — G4733 Obstructive sleep apnea (adult) (pediatric): Secondary | ICD-10-CM | POA: Diagnosis present

## 2024-06-30 DIAGNOSIS — Z79899 Other long term (current) drug therapy: Secondary | ICD-10-CM | POA: Diagnosis not present

## 2024-06-30 DIAGNOSIS — Z881 Allergy status to other antibiotic agents status: Secondary | ICD-10-CM

## 2024-06-30 DIAGNOSIS — Z809 Family history of malignant neoplasm, unspecified: Secondary | ICD-10-CM

## 2024-06-30 DIAGNOSIS — R404 Transient alteration of awareness: Secondary | ICD-10-CM | POA: Diagnosis not present

## 2024-06-30 DIAGNOSIS — Z743 Need for continuous supervision: Secondary | ICD-10-CM | POA: Diagnosis not present

## 2024-06-30 DIAGNOSIS — Z66 Do not resuscitate: Secondary | ICD-10-CM | POA: Diagnosis present

## 2024-06-30 DIAGNOSIS — Z86718 Personal history of other venous thrombosis and embolism: Secondary | ICD-10-CM

## 2024-06-30 DIAGNOSIS — T83518A Infection and inflammatory reaction due to other urinary catheter, initial encounter: Principal | ICD-10-CM | POA: Diagnosis present

## 2024-06-30 DIAGNOSIS — N390 Urinary tract infection, site not specified: Secondary | ICD-10-CM | POA: Diagnosis present

## 2024-06-30 DIAGNOSIS — Y846 Urinary catheterization as the cause of abnormal reaction of the patient, or of later complication, without mention of misadventure at the time of the procedure: Secondary | ICD-10-CM | POA: Diagnosis present

## 2024-06-30 DIAGNOSIS — K219 Gastro-esophageal reflux disease without esophagitis: Secondary | ICD-10-CM | POA: Diagnosis present

## 2024-06-30 DIAGNOSIS — G40909 Epilepsy, unspecified, not intractable, without status epilepticus: Secondary | ICD-10-CM | POA: Diagnosis present

## 2024-06-30 DIAGNOSIS — I5032 Chronic diastolic (congestive) heart failure: Secondary | ICD-10-CM | POA: Diagnosis present

## 2024-06-30 DIAGNOSIS — L89610 Pressure ulcer of right heel, unstageable: Secondary | ICD-10-CM | POA: Diagnosis present

## 2024-06-30 DIAGNOSIS — R569 Unspecified convulsions: Secondary | ICD-10-CM

## 2024-06-30 DIAGNOSIS — Z86711 Personal history of pulmonary embolism: Secondary | ICD-10-CM

## 2024-06-30 DIAGNOSIS — I110 Hypertensive heart disease with heart failure: Secondary | ICD-10-CM | POA: Diagnosis present

## 2024-06-30 DIAGNOSIS — I482 Chronic atrial fibrillation, unspecified: Secondary | ICD-10-CM | POA: Diagnosis present

## 2024-06-30 DIAGNOSIS — Z7901 Long term (current) use of anticoagulants: Secondary | ICD-10-CM | POA: Diagnosis not present

## 2024-06-30 DIAGNOSIS — N3000 Acute cystitis without hematuria: Secondary | ICD-10-CM | POA: Diagnosis not present

## 2024-06-30 DIAGNOSIS — I4891 Unspecified atrial fibrillation: Secondary | ICD-10-CM | POA: Diagnosis not present

## 2024-06-30 DIAGNOSIS — R4182 Altered mental status, unspecified: Secondary | ICD-10-CM | POA: Diagnosis present

## 2024-06-30 DIAGNOSIS — R262 Difficulty in walking, not elsewhere classified: Secondary | ICD-10-CM | POA: Diagnosis not present

## 2024-06-30 DIAGNOSIS — N319 Neuromuscular dysfunction of bladder, unspecified: Secondary | ICD-10-CM | POA: Diagnosis present

## 2024-06-30 DIAGNOSIS — I959 Hypotension, unspecified: Secondary | ICD-10-CM | POA: Diagnosis not present

## 2024-06-30 DIAGNOSIS — F0280 Dementia in other diseases classified elsewhere without behavioral disturbance: Secondary | ICD-10-CM | POA: Diagnosis present

## 2024-06-30 DIAGNOSIS — Z882 Allergy status to sulfonamides status: Secondary | ICD-10-CM

## 2024-06-30 DIAGNOSIS — G9340 Encephalopathy, unspecified: Secondary | ICD-10-CM | POA: Diagnosis not present

## 2024-06-30 DIAGNOSIS — B964 Proteus (mirabilis) (morganii) as the cause of diseases classified elsewhere: Secondary | ICD-10-CM | POA: Diagnosis present

## 2024-06-30 DIAGNOSIS — Z85820 Personal history of malignant melanoma of skin: Secondary | ICD-10-CM

## 2024-06-30 DIAGNOSIS — Z888 Allergy status to other drugs, medicaments and biological substances status: Secondary | ICD-10-CM

## 2024-06-30 DIAGNOSIS — Z931 Gastrostomy status: Secondary | ICD-10-CM | POA: Diagnosis not present

## 2024-06-30 DIAGNOSIS — F203 Undifferentiated schizophrenia: Secondary | ICD-10-CM | POA: Diagnosis present

## 2024-06-30 DIAGNOSIS — E782 Mixed hyperlipidemia: Secondary | ICD-10-CM | POA: Diagnosis present

## 2024-06-30 LAB — CBC WITH DIFFERENTIAL/PLATELET
Abs Immature Granulocytes: 0.12 K/uL — ABNORMAL HIGH (ref 0.00–0.07)
Basophils Absolute: 0.1 K/uL (ref 0.0–0.1)
Basophils Relative: 1 %
Eosinophils Absolute: 0.3 K/uL (ref 0.0–0.5)
Eosinophils Relative: 3 %
HCT: 35.9 % — ABNORMAL LOW (ref 39.0–52.0)
Hemoglobin: 11.8 g/dL — ABNORMAL LOW (ref 13.0–17.0)
Immature Granulocytes: 1 %
Lymphocytes Relative: 15 %
Lymphs Abs: 1.5 K/uL (ref 0.7–4.0)
MCH: 32.2 pg (ref 26.0–34.0)
MCHC: 32.9 g/dL (ref 30.0–36.0)
MCV: 97.8 fL (ref 80.0–100.0)
Monocytes Absolute: 1.1 K/uL — ABNORMAL HIGH (ref 0.1–1.0)
Monocytes Relative: 11 %
Neutro Abs: 7.2 K/uL (ref 1.7–7.7)
Neutrophils Relative %: 69 %
Platelets: 320 K/uL (ref 150–400)
RBC: 3.67 MIL/uL — ABNORMAL LOW (ref 4.22–5.81)
RDW: 14.6 % (ref 11.5–15.5)
WBC: 10.3 K/uL (ref 4.0–10.5)
nRBC: 0 % (ref 0.0–0.2)

## 2024-06-30 LAB — URINALYSIS, ROUTINE W REFLEX MICROSCOPIC
Bilirubin Urine: NEGATIVE
Glucose, UA: NEGATIVE mg/dL
Ketones, ur: NEGATIVE mg/dL
Nitrite: NEGATIVE
Protein, ur: NEGATIVE mg/dL
Specific Gravity, Urine: 1.005 (ref 1.005–1.030)
pH: 7 (ref 5.0–8.0)

## 2024-06-30 LAB — COMPREHENSIVE METABOLIC PANEL WITH GFR
ALT: <5 U/L (ref 0–44)
AST: 21 U/L (ref 15–41)
Albumin: 2.7 g/dL — ABNORMAL LOW (ref 3.5–5.0)
Alkaline Phosphatase: 117 U/L (ref 38–126)
Anion gap: 12 (ref 5–15)
BUN: 20 mg/dL (ref 8–23)
CO2: 27 mmol/L (ref 22–32)
Calcium: 8.8 mg/dL — ABNORMAL LOW (ref 8.9–10.3)
Chloride: 94 mmol/L — ABNORMAL LOW (ref 98–111)
Creatinine, Ser: 0.86 mg/dL (ref 0.61–1.24)
GFR, Estimated: >60 mL/min (ref >60)
Glucose, Bld: 91 mg/dL (ref 70–99)
Potassium: 3.4 mmol/L — ABNORMAL LOW (ref 3.5–5.1)
Sodium: 133 mmol/L — ABNORMAL LOW (ref 135–145)
Total Bilirubin: 0.6 mg/dL (ref 0.0–1.2)
Total Protein: 8.6 g/dL — ABNORMAL HIGH (ref 6.5–8.1)

## 2024-06-30 LAB — CBG MONITORING, ED: Glucose-Capillary: 100 mg/dL — ABNORMAL HIGH (ref 70–99)

## 2024-06-30 LAB — BLOOD GAS, VENOUS
Acid-Base Excess: 6.9 mmol/L — ABNORMAL HIGH (ref 0.0–2.0)
Bicarbonate: 32.5 mmol/L — ABNORMAL HIGH (ref 20.0–28.0)
Drawn by: 69260
O2 Saturation: 28.7 %
Patient temperature: 36.8
pCO2, Ven: 49 mmHg (ref 44–60)
pH, Ven: 7.43 (ref 7.25–7.43)
pO2, Ven: <31 mmHg — CL (ref 32–45)

## 2024-06-30 LAB — AMMONIA: Ammonia: <13 umol/L (ref 9–35)

## 2024-06-30 LAB — CK: Total CK: 50 U/L (ref 49–397)

## 2024-06-30 MED ORDER — SODIUM CHLORIDE 0.9 % IV SOLN
1.0000 g | Freq: Once | INTRAVENOUS | Status: AC
  Administered 2024-06-30: 1 g via INTRAVENOUS
  Filled 2024-06-30: qty 10

## 2024-06-30 MED ORDER — SODIUM CHLORIDE 0.9 % IV SOLN: 2.0000 g | Freq: Once | INTRAVENOUS | Status: DC

## 2024-06-30 MED ORDER — ACETAMINOPHEN 160 MG/5ML PO SOLN
650.0000 mg | Freq: Once | ORAL | Status: AC
  Administered 2024-06-30: 650 mg via ORAL
  Filled 2024-06-30: qty 20.3

## 2024-06-30 NOTE — ED Triage Notes (Signed)
 Pt bib ems for AMS x2days. Pt recently dx w/ pneumonia and prescribed Antibiotics. Per ems pt is normally responsive and talking but hasn't been acting normal. Pr has Trach in place, that ems had to suction PTA, and suprapubic cath in place. BP 140/90 126 CBG 99 RA 97.8 Ax Temp

## 2024-06-30 NOTE — ED Provider Notes (Signed)
 [place] EMERGENCY DEPARTMENT AT [facility] Provider Note   CSN: [id number] Arrival date & time: 06/30/24  1656     Patient presents with: Altered Mental Status   Cory Pacheco is a 78 y.o. male.   78 year old male presenting with altered mental status.  Symptoms been ongoing for several days, patient was recently diagnosed with pneumonia and started on antibiotic but his condition has continued to deteriorate.  He typically responds to questions but has limited response at this time, he does respond to painful stimuli. I spoke with the patient's wife who is able to provide collateral information.  She states that the patient started not acting himself on Friday, was seen by his home health PA on Monday and diagnosed with pneumonia and started on doxycycline , since then his condition has not improved.  She reports that he is normally much more alert and attempts to speak through his trach.  She reports that he seems to produce a lot of mucus from his trach and she is concerned that this is making him ill.   Altered Mental Status      Prior to Admission medications   Medication Sig Start Date End Date Taking? Authorizing Provider  acetaminophen  (TYLENOL ) 160 MG/5ML elixir Take 650 mg by mouth every 4 (four) hours as needed for fever.    [provider]  acetic acid  0.25 % irrigation Irrigate with as directed 2 (two) times daily. Instill 60cc twice a day through suprapubic tube to keep catheter patent 04/23/22   McKenzie, Belvie CROME, MD  albuterol  (PROVENTIL  HFA;VENTOLIN  HFA) 108 (90 Base) MCG/ACT inhaler Inhale 2 puffs into the lungs every 6 (six) hours as needed for wheezing or shortness of breath.    [provider]  AMBULATORY NON FORMULARY MEDICATION Home health nurse may exchange 22 french suprapubic catheter (SP tube) at home as needed. 12/17/23   Gerldine Lauraine JAYSON, FNP  apixaban (ELIQUIS) 5 MG TABS tablet 5 mg by Does not apply route 2 (two) times  daily. 01/18/21   [provider]  Ascorbic Acid (VITAMIN C) 1000 MG tablet Take 1,000 mg by mouth daily.    [provider]  benzonatate (TESSALON) 200 MG capsule Take 200 mg by mouth 3 (three) times daily as needed. 09/20/22   [provider]  carbidopa-levodopa (PARCOPA) 25-100 MG disintegrating tablet Take 2 tablets by mouth 4 (four) times daily.    [provider]  Cholecalciferol  (VITAMIN D3) 2000 UNITS TABS Take 1 tablet by mouth daily.    [provider]  clotrimazole  (LOTRIMIN ) 1 % cream Apply 1 application. topically 2 (two) times daily. 04/28/22   Summerlin, Julienne Annette, PA-C  diltiazem (CARDIZEM) 30 MG tablet Take 30 mg by mouth 3 (three) times daily.    [provider]  divalproex  (DEPAKOTE ) 500 MG DR tablet Take 1 tablet (500 mg total) by mouth 3 (three) times daily. 05/12/17   Elgergawy, Brayton RAMAN, MD  Divalproex  Sodium (DEPAKOTE  PO) 5 mLs by Feeding Tube route 3 (three) times daily. 250/5 mls    [provider]  doxycycline  (VIBRAMYCIN ) 100 MG capsule Take 100 mg by mouth 2 (two) times daily. 06/27/24 07/07/24  [provider]  fluticasone (FLONASE) 50 MCG/ACT nasal spray Place 2 sprays into both nostrils daily. 11/25/21   [provider]  folic acid  (FOLVITE ) 1 MG tablet Take 2 mg by mouth daily.    [provider]  furosemide  (LASIX ) 10 MG/ML solution Place 20 mg into feeding tube  daily. 11/25/21   [provider]  glycopyrrolate (ROBINUL) 1 MG tablet Take 1 mg by mouth 2 (two) times daily. Secretions 05/30/24   [provider]  lacosamide (VIMPAT) 10 MG/ML oral solution Take by mouth See admin instructions. 20 ml by mouth twice a day 11/25/21   [provider]  lansoprazole (PREVACID SOLUTAB) 30 MG disintegrating tablet Take 30 mg by mouth 2 (two) times daily.    [provider]  levETIRAcetam  (KEPPRA ) 500 MG tablet Take 500 mg by mouth 2 (two) times daily.  05/30/24   [provider]  metoprolol  tartrate (LOPRESSOR ) 50 MG tablet Take 1 tablet (50 mg total) by mouth 3 (three) times daily. 01/01/22   Kate Lonni CROME, MD  mirtazapine (REMERON) 7.5 MG tablet Take 7.5 mg by mouth at bedtime. 05/30/24   [provider]  Nutritional Supplements (FEEDING SUPPLEMENT, OSMOLITE 1.2 CAL,) LIQD Place into feeding tube daily. 2 quarts every day    [provider]  nystatin (MYCOSTATIN/NYSTOP) powder Apply 1 application topically 3 (three) times daily.    [provider]  PACERONE 200 MG tablet Take 200 mg by mouth daily. 05/31/24   [provider]  protein supplement (PROSOURCE NO CARB) LIQD 30 mLs.    [provider]  risperiDONE (RISPERDAL) 1 MG/ML oral solution Take by mouth See admin instructions. Give 1 and 1/2 ml s by route of feeding tube twice a day.    [provider]  scopolamine (TRANSDERM-SCOP) 1 MG/3DAYS Place 1 patch onto the skin every 3 (three) days as needed (nausea/motion sickness).    [provider]  zinc sulfate 220 (50 Zn) MG capsule Take 220 mg by mouth daily. 11/25/21   [provider]    Allergies: Penicillins, Azithromycin, Sulfa antibiotics, Bupropion, and Metformin    Review of Systems  Updated Vital Signs BP 92/78   Pulse 81   Temp 98.3 F (36.8 C) (Axillary)   Resp (!) 29   SpO2 100%     Physical Exam Vitals and nursing note reviewed.  Constitutional:      Appearance: He is ill-appearing.     Comments: Responds to painful stimuli, does not answer direct questions  HENT:     Head: Normocephalic.     Comments: Left upward gaze deviation Neck:     Comments: Trach  Cardiovascular:     Rate and Rhythm: Normal rate and regular rhythm.  Pulmonary:     Breath sounds: Normal breath sounds.     Comments: Tachypneic  Abdominal:     Palpations: Abdomen is soft.     Tenderness: There is no abdominal tenderness. There is no guarding.   Musculoskeletal:     Comments: Contracted extremities   Skin:    General: Skin is warm and dry.  Neurological:     Comments: Resting tremor in all extremities     (all labs ordered are listed, but only abnormal results are displayed) Labs Reviewed  CBC WITH DIFFERENTIAL/PLATELET - Abnormal; Notable for the following components:      Result Value   RBC 3.67 (*)    Hemoglobin 11.8 (*)    HCT 35.9 (*)    Monocytes Absolute 1.1 (*)    Abs Immature Granulocytes 0.12 (*)    All other components within normal limits  BLOOD GAS, VENOUS - Abnormal; Notable for the following components:   pO2, Ven <31 (*)    Bicarbonate 32.5 (*)    Acid-Base Excess 6.9 (*)    All  other components within normal limits  CBG MONITORING, ED - Abnormal; Notable for the following components:   Glucose-Capillary 100 (*)    All other components within normal limits  AMMONIA  COMPREHENSIVE METABOLIC PANEL WITH GFR  URINALYSIS, ROUTINE W REFLEX MICROSCOPIC  LEVETIRACETAM  LEVEL  CK    EKG: None  Radiology: CT Head Wo Contrast Result Date: 06/30/2024 CLINICAL DATA:  Altered mental status EXAM: CT HEAD WITHOUT CONTRAST TECHNIQUE: Contiguous axial images were obtained from the base of the skull through the vertex without intravenous contrast. RADIATION DOSE REDUCTION: This exam was performed according to the departmental dose-optimization program which includes automated exposure control, adjustment of the mA and/or kV according to patient size and/or use of iterative reconstruction technique. COMPARISON:  MRI 03/02/2024, CT brain 02/26/2024 FINDINGS: Brain: No acute territorial infarction, hemorrhage or intracranial mass. Moderate atrophy. Moderate chronic small vessel ischemic changes of the white matter. Stable ventricle size. Vascular: No hyperdense vessels. Vertebral and carotid vascular calcification. Skull: Normal. Negative for fracture or focal lesion. Sinuses/Orbits: Moderate mucosal thickening in the  sinuses Other: None IMPRESSION: 1. No CT evidence for acute intracranial abnormality. 2. Atrophy and chronic small vessel ischemic changes of the white matter. Electronically Signed   By: Luke Bun M.D.   On: 06/30/2024 18:59   DG Chest Portable 1 View Result Date: 06/30/2024 CLINICAL DATA:  Altered mental status. EXAM: PORTABLE CHEST 1 VIEW COMPARISON:  CT chest 05/19/2024 FINDINGS: Heart is enlarged. Tip of the tracheostomy is unchanged in position. The lungs are clear. There is no pleural effusion or pneumothorax. No acute fractures are seen. IMPRESSION: Cardiomegaly. No acute cardiopulmonary process. Electronically Signed   By: Greig Pique M.D.   On: 06/30/2024 18:06     Procedures   Medications Ordered in the ED - No data to display                                  Medical Decision Making This patient presents to the ED for concern of AMS, this involves an extensive number of treatment options, and is a complaint that carries with it a high risk of complications and morbidity.  The differential diagnosis includes pneumonia, urinary tract infection, intracranial mass, stroke, sepsis.   Co morbidities that complicate the patient evaluation  CHF, A-fib, tracheostomy, suprapubic catheter, Parkinson's   Additional history obtained:  Additional history obtained from record review, discussion with family members External records from outside source obtained and reviewed including [facility] medical discharge summary   Lab Tests:  I Ordered, and personally interpreted labs.  The pertinent results include: CBC notable for no leukocytosis, hemoglobin of 11.8 however this is similar as compared to patient's baseline, otherwise unremarkable.  Ammonia level within normal limits.  VBG notable for decreased PO2 at 31, elevated bicarb at 32.5, elevated acid-base excess at 6.9.   Imaging Studies ordered:  I ordered imaging studies including chest x-ray, head CT I independently visualized  and interpreted imaging which showed  - CXR: cardiomegaly, no acute cardiopulmonary process - CT head: 1. No CT evidence for acute intracranial abnormality.2. Atrophy and chronic small vessel ischemic changes of the white matter.  I agree with the radiologist interpretation   Cardiac Monitoring: / EKG:  The patient was maintained on a cardiac monitor.  I personally viewed and interpreted the cardiac monitored which showed an underlying rhythm of: NSR   Problem List / ED Course / Critical interventions / Medication  management I have reviewed the patients home medicines and have made adjustments as needed   Test / Admission - Considered:  Physical exam notable as above.  Patient was diagnosed with pneumonia earlier in the week by his home health provider and started on doxycycline , however chest x-ray appears clear as above.  He is afebrile, was tachypneic at time of presentation but otherwise vitals are reassuring.  Patient is unable to assist with history, he responds to painful stimuli but otherwise maintains a left upward gaze deviation with minimal response.    Patient handed off to [facility] at shift change with labs pending including CMP, levetiracetam  level, CK, urinalysis. Dispo pending based off of these results, but based off patient's new alteration mental status with his multiple comorbidities it is likely that he will require hospital admission.    Amount and/or Complexity of Data Reviewed Labs: ordered. Radiology: ordered. ECG/medicine tests: ordered.        Final diagnoses:  None    ED Discharge Orders     None          Glendia Rocky SAILOR, [place] 06/30/24 1904    Elnor Jayson LABOR, DO 07/01/24 1545

## 2024-06-30 NOTE — ED Provider Notes (Signed)
  Physical Exam  BP 110/71   Pulse 75   Temp 98.3 F (36.8 C) (Axillary)   Resp (!) 22   SpO2 100%   Physical Exam Vitals and nursing note reviewed.  Constitutional:      General: He is not in acute distress.    Appearance: He is well-developed.  HENT:     Head: Normocephalic and atraumatic.  Eyes:     Conjunctiva/sclera: Conjunctivae normal.  Cardiovascular:     Rate and Rhythm: Normal rate and regular rhythm.     Heart sounds: No murmur heard. Pulmonary:     Effort: Pulmonary effort is normal. No respiratory distress.     Breath sounds: Normal breath sounds.  Abdominal:     Palpations: Abdomen is soft.     Tenderness: There is no abdominal tenderness.  Musculoskeletal:        General: No swelling.     Cervical back: Neck supple.  Skin:    General: Skin is warm and dry.     Capillary Refill: Capillary refill takes less than 2 seconds.  Neurological:     Mental Status: He is alert. Mental status is at baseline.  Psychiatric:        Mood and Affect: Mood normal.     Procedures  Procedures  ED Course / MDM    Medical Decision Making Amount and/or Complexity of Data Reviewed Labs: ordered. Radiology: ordered. ECG/medicine tests: ordered.  Risk OTC drugs. Decision regarding hospitalization.   That was taken from [facility] Patient here with decreased responsiveness since Saturday, started on Doxy by PCP on Monday but he is not improving. At baseline he answers questions, has a tracheostomy and SP catheter.  Plan is for likely admission for altered mental status once the labs have resulted.  Patient's labs show no leukocytosis, hemoglobin 0.8, slightly decreased sodium potassium chloride  but not significantly, CK is normal, PCO2 is normal, ammonia is normal but patient does have large leukocytes, 21-50 white blood cells and few bacteria from his suprapubic catheter that was placed 6 days ago.  Will give Rocephin  for possible UTI patient is otherwise  encephalopathic and not safe for discharge.  I discussed with Dr. Charlton for admission and he was agreeable with this.  Discussed with patient's wife, he came home from the hospital a month ago, he has tracheostomy PEG tube and suprapubic catheter.  At home he is not on oxygen, just minified air through trach collar.            Suellen Sherran LABOR, PA-C 06/30/24 2333    Elnor Jayson LABOR, DO 07/01/24 1546

## 2024-06-30 NOTE — Telephone Encounter (Signed)
 Patent's wife left a message wanting to know how she should have him transported, wether ambulance or wheel chair that lays flat, she stated patient is not on oxygen but has a humidifier.    Please advise

## 2024-06-30 NOTE — Telephone Encounter (Signed)
 Spoke to patient wife and let her know the wheelchair would be fine per Dr Tobie

## 2024-07-01 ENCOUNTER — Encounter (HOSPITAL_COMMUNITY): Payer: Self-pay | Admitting: Family Medicine

## 2024-07-01 DIAGNOSIS — N3000 Acute cystitis without hematuria: Secondary | ICD-10-CM | POA: Diagnosis not present

## 2024-07-01 DIAGNOSIS — R569 Unspecified convulsions: Secondary | ICD-10-CM | POA: Diagnosis not present

## 2024-07-01 DIAGNOSIS — G9340 Encephalopathy, unspecified: Secondary | ICD-10-CM | POA: Diagnosis not present

## 2024-07-01 DIAGNOSIS — R1310 Dysphagia, unspecified: Secondary | ICD-10-CM | POA: Diagnosis not present

## 2024-07-01 DIAGNOSIS — N390 Urinary tract infection, site not specified: Secondary | ICD-10-CM | POA: Diagnosis present

## 2024-07-01 LAB — MAGNESIUM: Magnesium: 2.1 mg/dL (ref 1.7–2.4)

## 2024-07-01 LAB — RPR: RPR Ser Ql: NONREACTIVE

## 2024-07-01 LAB — BASIC METABOLIC PANEL WITH GFR
Anion gap: 10 (ref 5–15)
BUN: 21 mg/dL (ref 8–23)
CO2: 26 mmol/L (ref 22–32)
Calcium: 8.7 mg/dL — ABNORMAL LOW (ref 8.9–10.3)
Chloride: 100 mmol/L (ref 98–111)
Creatinine, Ser: 0.92 mg/dL (ref 0.61–1.24)
GFR, Estimated: >60 mL/min (ref >60)
Glucose, Bld: 94 mg/dL (ref 70–99)
Potassium: 3.7 mmol/L (ref 3.5–5.1)
Sodium: 136 mmol/L (ref 135–145)

## 2024-07-01 LAB — TSH: TSH: 4.234 u[IU]/mL (ref 0.350–4.500)

## 2024-07-01 LAB — CBC
HCT: 37.9 % — ABNORMAL LOW (ref 39.0–52.0)
Hemoglobin: 12 g/dL — ABNORMAL LOW (ref 13.0–17.0)
MCH: 31.2 pg (ref 26.0–34.0)
MCHC: 31.7 g/dL (ref 30.0–36.0)
MCV: 98.4 fL (ref 80.0–100.0)
Platelets: 292 K/uL (ref 150–400)
RBC: 3.85 MIL/uL — ABNORMAL LOW (ref 4.22–5.81)
RDW: 14.6 % (ref 11.5–15.5)
WBC: 7.8 K/uL (ref 4.0–10.5)
nRBC: 0 % (ref 0.0–0.2)

## 2024-07-01 LAB — VITAMIN B12: Vitamin B-12: 575 pg/mL (ref 180–914)

## 2024-07-01 LAB — GLUCOSE, CAPILLARY
Glucose-Capillary: 94 mg/dL (ref 70–99)
Glucose-Capillary: 97 mg/dL (ref 70–99)

## 2024-07-01 MED ORDER — ENOXAPARIN SODIUM 40 MG/0.4ML IJ SOSY
40.0000 mg | PREFILLED_SYRINGE | INTRAMUSCULAR | Status: DC
  Administered 2024-07-01 – 2024-07-06 (×6): 40 mg via SUBCUTANEOUS
  Filled 2024-07-01 (×6): qty 0.4

## 2024-07-01 MED ORDER — OSMOLITE 1.2 CAL PO LIQD
1000.0000 mL | ORAL | Status: DC
  Administered 2024-07-05: 1000 mL
  Filled 2024-07-01 (×8): qty 1000

## 2024-07-01 MED ORDER — IPRATROPIUM BROMIDE 0.02 % IN SOLN
RESPIRATORY_TRACT | Status: AC
  Administered 2024-07-01: 0.5 mg
  Filled 2024-07-01: qty 2.5

## 2024-07-01 MED ORDER — ACETAMINOPHEN 325 MG PO TABS
650.0000 mg | ORAL_TABLET | Freq: Four times a day (QID) | ORAL | Status: DC | PRN
  Administered 2024-07-04 – 2024-07-06 (×3): 650 mg
  Filled 2024-07-01 (×3): qty 2

## 2024-07-01 MED ORDER — CHLORHEXIDINE GLUCONATE CLOTH 2 % EX PADS
6.0000 | MEDICATED_PAD | Freq: Every day | CUTANEOUS | Status: DC
  Administered 2024-07-01 – 2024-07-06 (×7): 6 via TOPICAL

## 2024-07-01 MED ORDER — POLYETHYLENE GLYCOL 3350 17 G PO PACK
17.0000 g | PACK | Freq: Every day | ORAL | Status: DC
  Administered 2024-07-01 – 2024-07-06 (×4): 17 g
  Filled 2024-07-01 (×5): qty 1

## 2024-07-01 MED ORDER — VALPROATE SODIUM 250 MG/5ML PO SOLN
250.0000 mg | Freq: Three times a day (TID) | ORAL | Status: DC
  Administered 2024-07-01 – 2024-07-06 (×16): 250 mg
  Filled 2024-07-01 (×20): qty 5

## 2024-07-01 MED ORDER — SCOPOLAMINE 1 MG/3DAYS TD PT72
1.0000 | MEDICATED_PATCH | TRANSDERMAL | Status: DC | PRN
  Administered 2024-07-01: 1.5 mg via TRANSDERMAL
  Filled 2024-07-01: qty 1

## 2024-07-01 MED ORDER — SODIUM CHLORIDE 0.9 % IV SOLN
1.0000 g | INTRAVENOUS | Status: DC
  Administered 2024-07-01 – 2024-07-04 (×4): 1 g via INTRAVENOUS
  Filled 2024-07-01 (×4): qty 10

## 2024-07-01 MED ORDER — METOPROLOL TARTRATE 25 MG PO TABS
12.5000 mg | ORAL_TABLET | Freq: Four times a day (QID) | ORAL | Status: DC
  Administered 2024-07-01 – 2024-07-06 (×19): 12.5 mg
  Filled 2024-07-01 (×21): qty 1

## 2024-07-01 MED ORDER — SODIUM CHLORIDE 0.9% FLUSH
3.0000 mL | Freq: Two times a day (BID) | INTRAVENOUS | Status: DC
  Administered 2024-07-01 – 2024-07-06 (×11): 3 mL via INTRAVENOUS

## 2024-07-01 MED ORDER — LEVETIRACETAM 100 MG/ML PO SOLN
500.0000 mg | Freq: Two times a day (BID) | ORAL | Status: DC
  Administered 2024-07-01 – 2024-07-06 (×12): 500 mg
  Filled 2024-07-01 (×14): qty 5

## 2024-07-01 MED ORDER — PANTOPRAZOLE SODIUM 40 MG PO TBEC: 40.0000 mg | DELAYED_RELEASE_TABLET | Freq: Two times a day (BID) | ORAL | Status: DC

## 2024-07-01 MED ORDER — CARBIDOPA-LEVODOPA 10-100MG/5ML ORAL SUSPENSION: 25.0000 mL | Freq: Four times a day (QID) | ORAL | Status: DC

## 2024-07-01 MED ORDER — CARBIDOPA-LEVODOPA 25-100 MG PO TABS
2.0000 | ORAL_TABLET | Freq: Four times a day (QID) | ORAL | Status: DC
  Administered 2024-07-01 – 2024-07-06 (×22): 2
  Filled 2024-07-01 (×23): qty 2

## 2024-07-01 MED ORDER — FREE WATER
100.0000 mL | Status: DC
  Administered 2024-07-01 – 2024-07-06 (×30): 100 mL

## 2024-07-01 MED ORDER — ALBUTEROL SULFATE (2.5 MG/3ML) 0.083% IN NEBU
INHALATION_SOLUTION | RESPIRATORY_TRACT | Status: AC
  Administered 2024-07-01: 5 mg
  Filled 2024-07-01: qty 6

## 2024-07-01 MED ORDER — LANSOPRAZOLE 30 MG PO TBDD: 30.0000 mg | DELAYED_RELEASE_TABLET | Freq: Two times a day (BID) | ORAL | Status: DC

## 2024-07-01 MED ORDER — POTASSIUM CHLORIDE IN NACL 20-0.9 MEQ/L-% IV SOLN: INTRAVENOUS | Status: AC

## 2024-07-01 MED ORDER — PROSOURCE TF20 ENFIT COMPATIBL EN LIQD
60.0000 mL | Freq: Every day | ENTERAL | Status: DC
  Administered 2024-07-01 – 2024-07-06 (×6): 60 mL
  Filled 2024-07-01 (×6): qty 60

## 2024-07-01 MED ORDER — PROCHLORPERAZINE EDISYLATE 10 MG/2ML IJ SOLN: 5.0000 mg | Freq: Four times a day (QID) | INTRAMUSCULAR | Status: DC | PRN

## 2024-07-01 MED ORDER — GLYCOPYRROLATE 1 MG PO TABS
1.0000 mg | ORAL_TABLET | Freq: Two times a day (BID) | ORAL | Status: DC
  Administered 2024-07-01 – 2024-07-06 (×12): 1 mg
  Filled 2024-07-01 (×14): qty 1

## 2024-07-01 MED ORDER — OSMOLITE 1.2 CAL PO LIQD
1000.0000 mL | ORAL | Status: DC
  Administered 2024-07-01: 1000 mL
  Filled 2024-07-01 (×2): qty 1000

## 2024-07-01 MED ORDER — AMIODARONE HCL 200 MG PO TABS
200.0000 mg | ORAL_TABLET | Freq: Every day | ORAL | Status: DC
  Administered 2024-07-01 – 2024-07-06 (×6): 200 mg
  Filled 2024-07-01 (×6): qty 1

## 2024-07-01 MED ORDER — PANTOPRAZOLE SODIUM 40 MG IV SOLR
40.0000 mg | Freq: Two times a day (BID) | INTRAVENOUS | Status: DC
  Administered 2024-07-01 – 2024-07-06 (×11): 40 mg via INTRAVENOUS
  Filled 2024-07-01 (×11): qty 10

## 2024-07-01 MED ORDER — IPRATROPIUM-ALBUTEROL 0.5-2.5 (3) MG/3ML IN SOLN
3.0000 mL | RESPIRATORY_TRACT | Status: DC | PRN
  Administered 2024-07-02: 3 mL via RESPIRATORY_TRACT
  Filled 2024-07-01: qty 3

## 2024-07-01 MED ORDER — LACOSAMIDE 10 MG/ML PO SOLN
200.0000 mg | Freq: Two times a day (BID) | ORAL | Status: DC
  Administered 2024-07-01 – 2024-07-06 (×11): 200 mg
  Filled 2024-07-01 (×11): qty 20

## 2024-07-01 NOTE — Plan of Care (Signed)
  Problem: Pain Managment: Goal: General experience of comfort will improve and/or be controlled Outcome: Progressing   Problem: Safety: Goal: Ability to remain free from injury will improve Outcome: Progressing   Problem: Skin Integrity: Goal: Risk for impaired skin integrity will decrease Outcome: Progressing

## 2024-07-01 NOTE — Progress Notes (Addendum)
 Initial Nutrition Assessment  DOCUMENTATION CODES:   Not applicable  INTERVENTION:   Check serum copper level. If deficient, recommend 2mg /d copper sulfate via tube x 6 days.   Resume TF via J port of G-J tube:  Osmolite 1.2 at 65 ml/h Prosource TF20 60 ml once daily Provides 1952 kcal, 107 gm protein, 1279 ml free water  daily. Free water  flushes 100 ml q4h for a total of 1879 ml free water  daily.   NUTRITION DIAGNOSIS:   Inadequate oral intake related to inability to eat as evidenced by NPO status.  GOAL:   Patient will meet greater than or equal to 90% of their needs  MONITOR:   TF tolerance  REASON FOR ASSESSMENT:   Consult Assessment of nutrition requirement/status  ASSESSMENT:   78 yo male admitted with AMS. PMH includes schizophrenia, Parkinson's disease, seizure disorder, A fib, dysphagia, PEG, neurogenic bladder, OSA, tracheostomy.  Unable to speak with patient or complete NFPE at this time.    Currently on trach collar.   Per chart review, patient was at [facility] from April until being discharged home in June. During [facility] hospitalization, he was receiving Osmolite 1.5 at 45 ml/h via G-J tube. Per home meds list, he was taking 2 quarts of Osmolite 1.2 per day at home along with 30 ml Prosource No Carb daily. He has been receiving zinc sulfate 220 mg since 11/25/2021. Prolonged supplementation of zinc sulfate can cause a copper deficiency. Will check serum copper level.  Per discussion with MD, okay to resume TF via G-J tube today.  Labs reviewed.  CBG: 100  Medications reviewed and include protonix , miralax. IVF: NS with KCl 20 mEq/L at 65 ml/h.  Current weight unavailable. Most recent weight from 05/24/24 is 112.4 kg at [facility].  NUTRITION - FOCUSED PHYSICAL EXAM:  Unable to complete  Diet Order:   Diet Order             Diet NPO time specified  Diet effective now                   EDUCATION NEEDS:   Not appropriate for  education at this time  Skin:  Skin Assessment: Skin Integrity Issues: Skin Integrity Issues:: DTI DTI: R heel  Last BM:  no BM documented  Height:   Ht Readings from Last 1 Encounters:  04/02/24 5' 11 (1.803 m)    Weight:   Wt Readings from Last 1 Encounters:  04/02/24 107.5 kg  05/24/24 112.4 kg (at [facility])  Estimated Nutritional Needs:   Kcal:  1800-2000  Protein:  100-115 gm  Fluid:  2 L   Suzen HUNT RD, LDN, CNSC Contact via secure chat. If unavailable, use group chat RD Inpatient.

## 2024-07-01 NOTE — H&P (Signed)
 History and Physical    Cory Pacheco FMW:[id number] DOB: 1946-05-12 DOA: 06/30/2024  PCP: Delores Corean Pollen, MD   Patient coming from: Home   Chief Complaint: AMS   HPI: Cory Pacheco is a 78 y.o. male with medical history significant for schizophrenia, Parkinson's disease, seizure disorder, atrial fibrillation not anticoagulated, dysphagia with PEG tube, neurogenic bladder with suprapubic catheter, OSA, and tracheostomy dependence who presents for evaluation of altered mental status.  Patient is accompanied by his wife who assists with the history.  Patient was discharged home from an LTAC roughly a month ago.  He has not been talking as much or responding as much as usual for the past week, was diagnosed with possible pneumonia on 06/27/2024 and prescribed doxycycline , but has continued to be poorly responsive at home.  Patient was not complaining of anything in particular prior to becoming less responsive.  ED Course: Upon arrival to the ED, patient is found to be afebrile and saturating 100% on 6 L/min of supplemental oxygen with mild tachypnea, normal HR, and SBP in the 90s and greater.  Labs are most notable for potassium 3.4, normal creatinine, albumin 2.7, normal WBC, undetectable ammonia, normal serum CK, and normal pCO2.  There are no acute findings on head CT or chest x-ray.  Patient was treated with acetaminophen  and Rocephin  in the ED.  Review of Systems:  ROS Limited by patient's clinical condition.  Past Medical History:  Diagnosis Date   Acute kidney injury (HCC)    Anxiety    Arthritis    Atrial fibrillation (HCC)    Depression    Heart failure (HCC)    Hypertension    Neurogenic bladder    OSA (obstructive sleep apnea)    Parkinson's disease (HCC)    Schizophrenia (HCC)    Seizure (HCC)     Past Surgical History:  Procedure Laterality Date   CHOLECYSTECTOMY     COLONOSCOPY     COLONOSCOPY N/A 08/03/2013   Procedure: COLONOSCOPY;  Surgeon:  Claudis RAYMOND Rivet, MD;  Location: [facility];  Service: Endoscopy;  Laterality: N/A;  930   TONSILLECTOMY      Social History:   reports that he has never smoked. He has never used smokeless tobacco. He reports that he does not drink alcohol  and does not use drugs.  Allergies  Allergen Reactions   Penicillins Hives, Dermatitis and Other (See Comments)    Childhood reaction Patient has tolerated cefepime  and Rocephin  Pt's spouse stated it doesn't do anything for him   Azithromycin Dermatitis and Other (See Comments)    Altered mental status   Sulfa Antibiotics Hives, Dermatitis and Other (See Comments)    Pt unsure of reaction   Bupropion Dermatitis and Other (See Comments)    Patient and pt wife unsure of reaction   Metformin Diarrhea, Dermatitis and Other (See Comments)    Patient not sure    Family History  Problem Relation Age of Onset   Cancer Sister    Sudden death Neg Hx    Sickle cell trait Neg Hx    Lupus Neg Hx    Anesthesia problems Neg Hx      Prior to Admission medications   Medication Sig Start Date End Date Taking? Authorizing Provider  acetaminophen  (TYLENOL ) 160 MG/5ML elixir Take 650 mg by mouth every 6 (six) hours as needed for fever or pain.   Yes [provider]  acetic acid  0.25 % irrigation Irrigate with as directed 2 (two) times  daily. Instill 60cc twice a day through suprapubic tube to keep catheter patent 04/23/22  Yes McKenzie, Belvie CROME, MD  Ascorbic Acid (VITAMIN C) 1000 MG tablet Take 1,000 mg by mouth daily.   Yes [provider]  carbidopa-levodopa (PARCOPA) 25-100 MG disintegrating tablet Take 2 tablets by mouth 4 (four) times daily.   Yes [provider]  clotrimazole  (LOTRIMIN ) 1 % cream Apply 1 application. topically 2 (two) times daily. 04/28/22  Yes Summerlin, Julienne Annette, PA-C  doxycycline  (VIBRAMYCIN ) 100 MG capsule Take 100 mg by mouth 2 (two) times daily. 06/27/24 07/07/24 Yes [provider]   folic acid  (FOLVITE ) 1 MG tablet Take 2 mg by mouth daily.   Yes [provider]  glycopyrrolate (ROBINUL) 1 MG tablet Take 1 mg by mouth 2 (two) times daily. Secretions 05/30/24  Yes [provider]  lacosamide (VIMPAT) 10 MG/ML oral solution Take 20 mLs by mouth 2 (two) times daily. 20 ml by mouth twice a day 11/25/21  Yes [provider]  levETIRAcetam  (KEPPRA ) 500 MG tablet Take 500 mg by mouth 2 (two) times daily. 05/30/24  Yes [provider]  metoprolol  tartrate (LOPRESSOR ) 50 MG tablet Take 1 tablet (50 mg total) by mouth 3 (three) times daily. Patient taking differently: Take 12.5 mg by mouth every 6 (six) hours. 01/01/22  Yes Kate Lonni CROME, MD  mirtazapine (REMERON) 7.5 MG tablet Take 7.5 mg by mouth at bedtime. 05/30/24  Yes [provider]  Multiple Vitamins-Iron (TAB-A-VITE/IRON/BETA CAROTENE) TABS Take 1 tablet by mouth daily.   Yes [provider]  Nutritional Supplements (FEEDING SUPPLEMENT, OSMOLITE 1.2 CAL,) LIQD Place 1,000 mLs into feeding tube daily. 2 quarts every day   Yes [provider]  nystatin (MYCOSTATIN/NYSTOP) powder Apply 1 application topically 3 (three) times daily.   Yes [provider]  omeprazole 20 MG TBDD disintegrating tablet Take 40 mg by mouth daily.   Yes [provider]  OVER THE COUNTER MEDICATION Apply 1 Application topically daily. Apply for diaper rash Desyten cream   Yes [provider]  PACERONE 200 MG tablet Take 200 mg by mouth daily. 05/31/24  Yes [provider]  polyethylene glycol (MIRALAX / GLYCOLAX) 17 g packet Take 17 g by mouth daily.   Yes [provider]  protein supplement (PROSOURCE NO CARB) LIQD Place 30 mLs into feeding tube daily.   Yes [provider]  risperiDONE (RISPERDAL) 1 MG/ML oral solution Take 2 mg by mouth 2 (two) times daily. Give 1 and 1/2 ml s by route of feeding tube twice a day.   Yes [provider]  scopolamine (TRANSDERM-SCOP) 1 MG/3DAYS Place 1 patch onto the skin every 3 (three) days as needed (nausea/motion sickness).   Yes [provider]  thiamine (VITAMIN B1) 100 MG tablet Take 100 mg by mouth daily.   Yes [provider]  Valproate Sodium  (VALPROIC  ACID) 500 MG/10ML SOLN Place 5 mLs into feeding tube in the morning, at noon, and at bedtime.   Yes [provider]  vitamin B-12 (CYANOCOBALAMIN) 100 MCG tablet Take 100 mcg by mouth daily.   Yes [provider]  zinc sulfate 220 (50 Zn) MG capsule Take 220 mg by mouth daily. 11/25/21  Yes [provider]  AMBULATORY NON FORMULARY MEDICATION Home health nurse may exchange 22 french suprapubic catheter (SP tube) at home as needed. 12/17/23   Gerldine Lauraine BROCKS, FNP  lansoprazole (PREVACID SOLUTAB) 30 MG disintegrating tablet Take 30 mg  by mouth 2 (two) times daily.    [provider]    Physical Exam: Vitals:   07/01/24 0000 07/01/24 0000 07/01/24 0015 07/01/24 0030  BP: 101/61   116/71  Pulse: 82  80 83  Resp: (!) 23  18 15   Temp:  98.3 F (36.8 C)    TempSrc:  Axillary    SpO2: 100%  100% 100%    Constitutional: Not in acute distress, no diaphoresis   Eyes: PERTLA, lids and conjunctivae normal ENMT: Mucous membranes are moist. Posterior pharynx clear of any exudate or lesions.   Neck: supple, no masses  Respiratory: no wheezing, no crackles. No accessory muscle use.  Cardiovascular: S1 & S2 heard, regular rate and rhythm. Trace lower extremity edema.   Abdomen: No tenderness, soft. Bowel sounds active.  Musculoskeletal: no clubbing / cyanosis. No joint deformity upper and lower extremities.   Skin: no significant rashes, lesions, ulcers. Warm, dry, well-perfused. Neurologic: CN 2-12 grossly intact. Responds to painful stimuli but no verbal response.     Labs and Imaging on Admission: I have personally reviewed following labs and imaging  studies  CBC: Recent Labs  Lab 06/30/24 1808  WBC 10.3  NEUTROABS 7.2  HGB 11.8*  HCT 35.9*  MCV 97.8  PLT 320   Basic Metabolic Panel: Recent Labs  Lab 06/30/24 1808  NA 133*  K 3.4*  CL 94*  CO2 27  GLUCOSE 91  BUN 20  CREATININE 0.86  CALCIUM 8.8*   GFR: CrCl cannot be calculated (Unknown ideal weight.). Liver Function Tests: Recent Labs  Lab 06/30/24 1808  AST 21  ALT <5  ALKPHOS 117  BILITOT 0.6  PROT 8.6*  ALBUMIN 2.7*   No results for input(s): LIPASE, AMYLASE in the last 168 hours. Recent Labs  Lab 06/30/24 1801  AMMONIA <13   Coagulation Profile: No results for input(s): INR, PROTIME in the last 168 hours. Cardiac Enzymes: Recent Labs  Lab 06/30/24 1808  CKTOTAL 50   BNP (last 3 results) No results for input(s): PROBNP in the last 8760 hours. HbA1C: No results for input(s): HGBA1C in the last 72 hours. CBG: Recent Labs  Lab 06/30/24 1845  GLUCAP 100*   Lipid Profile: No results for input(s): CHOL, HDL, LDLCALC, TRIG, CHOLHDL, LDLDIRECT in the last 72 hours. Thyroid  Function Tests: No results for input(s): TSH, T4TOTAL, FREET4, T3FREE, THYROIDAB in the last 72 hours. Anemia Panel: No results for input(s): VITAMINB12, FOLATE, FERRITIN, TIBC, IRON, RETICCTPCT in the last 72 hours. Urine analysis:    Component Value Date/Time   COLORURINE YELLOW 06/30/2024 1801   APPEARANCEUR HAZY (A) 06/30/2024 1801   APPEARANCEUR Turbid (A) 01/08/2023 1511   LABSPEC 1.005 06/30/2024 1801   PHURINE 7.0 06/30/2024 1801   GLUCOSEU NEGATIVE 06/30/2024 1801   HGBUR SMALL (A) 06/30/2024 1801   BILIRUBINUR NEGATIVE 06/30/2024 1801   BILIRUBINUR Negative 01/08/2023 1511   KETONESUR NEGATIVE 06/30/2024 1801   PROTEINUR NEGATIVE 06/30/2024 1801   NITRITE NEGATIVE 06/30/2024 1801   LEUKOCYTESUR LARGE (A) 06/30/2024 1801   Sepsis Labs: @LABRCNTIP (procalcitonin:4,lacticidven:4) )No results found for this or  any previous visit (from the past 240 hours).   Radiological Exams on Admission: CT Head Wo Contrast Result Date: 06/30/2024 CLINICAL DATA:  Altered mental status EXAM: CT HEAD WITHOUT CONTRAST TECHNIQUE: Contiguous axial images were obtained from the base of the skull through the vertex without intravenous contrast. RADIATION DOSE REDUCTION: This exam was performed according to the departmental dose-optimization program which includes automated exposure control,  adjustment of the mA and/or kV according to patient size and/or use of iterative reconstruction technique. COMPARISON:  MRI 03/02/2024, CT brain 02/26/2024 FINDINGS: Brain: No acute territorial infarction, hemorrhage or intracranial mass. Moderate atrophy. Moderate chronic small vessel ischemic changes of the white matter. Stable ventricle size. Vascular: No hyperdense vessels. Vertebral and carotid vascular calcification. Skull: Normal. Negative for fracture or focal lesion. Sinuses/Orbits: Moderate mucosal thickening in the sinuses Other: None IMPRESSION: 1. No CT evidence for acute intracranial abnormality. 2. Atrophy and chronic small vessel ischemic changes of the white matter. Electronically Signed   By: Luke Bun M.D.   On: 06/30/2024 18:59   DG Chest Portable 1 View Result Date: 06/30/2024 CLINICAL DATA:  Altered mental status. EXAM: PORTABLE CHEST 1 VIEW COMPARISON:  CT chest 05/19/2024 FINDINGS: Heart is enlarged. Tip of the tracheostomy is unchanged in position. The lungs are clear. There is no pleural effusion or pneumothorax. No acute fractures are seen. IMPRESSION: Cardiomegaly. No acute cardiopulmonary process. Electronically Signed   By: Greig Pique M.D.   On: 06/30/2024 18:06    Assessment/Plan   1. Acute encephalopathy; suspected UTI  - Patient has been less responsive for 1 week despite treatment of possible pneumonia with doxycycline   - There are no acute findings on head CT in the ED, ammonia is undetectable, and  pCO2 is normal  - Culture urine, continue Rocephin  for possible UTI while following culture and clinical course, check EEG, TSH, RPR, B12, and thiamine, use delirium precautions    2. Tracheostomy and PEG tube dependence  - Continue tracheostomy and PEG tube care    3. Schizophrenia  - Limit potentially sedating medications for now    4. Parkinson's disease  - Continue carbidopa-levodopa    5. Atrial fibrillation  - Not anticoagulated pta  - Continue amiodarone and metoprolol     6. Seizures  - No clinical seizures in ED  - Continue valproic  acid, Keppra , and Vimpat    DVT prophylaxis: Lovenox  Code Status: Full, discussed with pt's wife at bedside in ED  Level of Care: Level of care: Stepdown Family Communication: Wife at bedside  Disposition Plan:  Patient is from: home  Anticipated d/c is to: TBD Anticipated d/c date is: 07/04/24  Patient currently: Pending improved mental status  Consults called: None   Admission status: Inpatient     Cory GORMAN Sprinkles, MD [facility]  07/01/2024, 1:01 AM

## 2024-07-01 NOTE — Progress Notes (Signed)
 Progress Note   Patient: Cory Pacheco FMW:[id number] DOB: 07-01-1946 DOA: 06/30/2024     1 DOS: the patient was seen and examined on 07/01/2024   Brief hospital admission narrative: As per H&P written by Dr. Charlton on 06/30/2024 Cory Pacheco is a 78 y.o. male with medical history significant for schizophrenia, Parkinson's disease, seizure disorder, atrial fibrillation not anticoagulated, dysphagia with PEG tube, neurogenic bladder with suprapubic catheter, OSA, and tracheostomy dependence who presents for evaluation of altered mental status.   Patient is accompanied by his wife who assists with the history.  Patient was discharged home from an LTAC roughly a month ago.  He has not been talking as much or responding as much as usual for the past week, was diagnosed with possible pneumonia on 06/27/2024 and prescribed doxycycline , but has continued to be poorly responsive at home.  Patient was not complaining of anything in particular prior to becoming less responsive.   ED Course: Upon arrival to the ED, patient is found to be afebrile and saturating 100% on 6 L/min of supplemental oxygen with mild tachypnea, normal HR, and SBP in the 90s and greater.  Labs are most notable for potassium 3.4, normal creatinine, albumin 2.7, normal WBC, undetectable ammonia, normal serum CK, and normal pCO2.  There are no acute findings on head CT or chest x-ray.   Patient was treated with acetaminophen  and Rocephin  in the ED.  Assessment and plan  1-acute metabolic encephalopathy - Suspected to be associated with UTI (patient at high risk for infection secondary to chronic indwelling catheter). - Follow-up results of EEG, TSH, RPR and B12 ordered to be thorough - Continue constant orientation and supportive care - CT scan of the head demonstrated no acute intracranial normalities and patient's ammonia level and CO2 within normal limits. - Continue empirical treatment with antibiotics and follow clinical  response. - Follow culture results.  2-tracheostomy and PEG tube dependency - Stable for the most part - Continue tracheostomy care and PEG tube feeding - Dietitian has been involved for continuation of nutrition.  3-schizophrenia - Continue home medications limiting as much as possible sedation agents - Continue supportive care and consult reorientation.  4-Parkinson's disease - Continue carbidopa levodopa  5-seizure disorders - No seizure currently appreciated - Given metabolic encephalopathy changes EEG has been ordered and will follow result - Continue valproic  acid, Keppra  and Vimpat.  6-chronic atrial fibrillation - Not a candidate for anticoagulation - Continue amiodarone and metoprolol  for rate control.   Subjective:  Limited examination; was able to follow simple commands and answer some yes or no questions.  Afebrile.  PEG tube in place with increased rhonchorous/secretion sounds appreciated.  No nausea or vomiting.  Physical Exam: Vitals:   07/01/24 1136 07/01/24 1200 07/01/24 1224 07/01/24 1548  BP:  (!) 96/56    Pulse:      Resp:      Temp: 98.2 F (36.8 C)   98.3 F (36.8 C)  TempSrc: Axillary   Axillary  SpO2:   100%    General exam: Alert, awake, following simple commands.  No fever. Respiratory system: Tracheostomy in place; positive rhonchi appreciated bilaterally.  Good oxygen saturation. Cardiovascular system: Rate controlled, no rubs, no gallops, no JVD.  Positive murmur appreciated on exam. Gastrointestinal system: Abdomen is nondistended, soft and without guarding.  Positive bowel sounds appreciated.  PEG tube in place.  No signs of infection or drainage seen. Central nervous system: No new focal neurological deficits. Extremities: No cyanosis or clubbing.  Prevalon boots in place. Skin: No petechiae. Psychiatry: Judgement and insight appear impaired secondary to encephalopathic process; flat affect.  Data Reviewed: Basic metabolic panel:  Sodium 136, potassium 3.7, chloride 100, bicarb 26, BUN 21, creatinine 0.92 and GFR >60 Magnesium: 2.1 CBC: WBC 7.8, hemoglobin 12.0 platelet count 292K MRSA PCR: Pending  Family Communication: No family at bedside.  Disposition: Status is: Inpatient Remains inpatient appropriate because: Continue IV therapy.   Time spent: 50 minutes  Author: Eric Nunnery, MD 07/01/2024 4:13 PM  For on call review [email or web address].

## 2024-07-01 NOTE — Progress Notes (Signed)
 Patient transported with RN from ED bed 2 to ICU room 11. Patient's airway suctioned upon arrival to ICU. VSS throughout and no adverse events noted. Patient placed on ATC @ 28% once in ICU (was transported on 30% Venturi-trach mask).

## 2024-07-01 NOTE — Plan of Care (Signed)
  Problem: Education: Goal: Knowledge of General Education information will improve Description: Including pain rating scale, medication(s)/side effects and non-pharmacologic comfort measures Outcome: Not Progressing   Problem: Health Behavior/Discharge Planning: Goal: Ability to manage health-related needs will improve Outcome: Progressing   Problem: Clinical Measurements: Goal: Ability to maintain clinical measurements within normal limits will improve Outcome: Progressing Goal: Will remain free from infection Outcome: Progressing Goal: Diagnostic test results will improve Outcome: Progressing Goal: Respiratory complications will improve Outcome: Progressing Goal: Cardiovascular complication will be avoided Outcome: Progressing   Problem: Activity: Goal: Risk for activity intolerance will decrease Outcome: Not Progressing   Problem: Nutrition: Goal: Adequate nutrition will be maintained Outcome: Not Progressing   Problem: Coping: Goal: Level of anxiety will decrease Outcome: Progressing   Problem: Elimination: Goal: Will not experience complications related to bowel motility Outcome: Progressing Goal: Will not experience complications related to urinary retention Outcome: Progressing   Problem: Pain Managment: Goal: General experience of comfort will improve and/or be controlled Outcome: Progressing   Problem: Safety: Goal: Ability to remain free from injury will improve Outcome: Progressing   Problem: Skin Integrity: Goal: Risk for impaired skin integrity will decrease Outcome: Not Progressing

## 2024-07-01 NOTE — TOC Initial Note (Signed)
 Transition of Care [facility]) - Initial/Assessment Note    Patient Details  Name: Cory Pacheco MRN: [id number] Date of Birth: 01/16/1946  [facility] CM/SW Contact:    Sharlyne Stabs, RN Phone Number: 07/01/2024, 2:30 PM  Clinical Narrative:      Patient admitted in the unit with acute encephalopathy.  Tracheostomy and PEG tube dependent. Live at home with his wife. Patient is very sick, cultures pending. TOC following work up.     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   Dysphagia, unspecified 09/22/2023   Mixed hyperlipidemia 09/22/2023   Need for assistance with personal care 09/22/2023   Obesity 09/22/2023   Malignant melanoma (HCC) 09/22/2023   Parkinson's disease with dyskinesia, without mention of fluctuations (HCC) 09/22/2023   Paroxysmal atrial fibrillation (HCC) 09/22/2023   Personal history of pulmonary embolism 09/22/2023   Status post thoracic spinal fusion 09/22/2023   Actinic keratosis 09/22/2023   Altered mental status 09/22/2023   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   Bilateral pseudophakia 09/22/2023   History of DVT (deep vein thrombosis) 09/22/2023   Neurogenic bladder 09/22/2023   Chronic retention of urine 09/22/2023   Chronic suprapubic catheter (HCC) 09/22/2023   Kidney stones 09/22/2023   Renal cyst 09/22/2023   Moderate protein-calorie malnutrition (HCC) 04/03/2023   Chronic heart failure with preserved ejection fraction (HCC) 04/02/2023   Gastroesophageal reflux disease without esophagitis 04/02/2023   OSA (obstructive sleep apnea) 04/02/2023   Cerebral atrophy (HCC) 04/02/2023   Permanent atrial fibrillation with RVR (HCC) 04/02/2023   Presence of  externally removable percutaneous endoscopic gastrostomy (PEG) tube (HCC) 04/02/2023   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 [place] [address] Phone: [phone] Fax: [phone]  [facility] - [place], [place] - [address] [place] [address] Phone: [phone] Fax: [phone]  [facility] [address] [place] - [address] [place] [address] Phone: [phone] Fax: [phone]     Social Drivers of Health (SDOH) Social History: SDOH Screenings   Food Insecurity: Patient Unable To Answer (07/01/2024)  Housing: Patient Unable To Answer (07/01/2024)  Transportation Needs: Patient Unable To Answer (07/01/2024)  Utilities: Patient Unable To Answer (07/01/2024)  Financial Resource Strain: Low Risk  (03/24/2024)   Received from [facility]  Physical Activity: Inactive (04/03/2023)   Received from [facility]  Social Connections: Patient Unable To Answer (07/01/2024)  Stress: Patient Unable To Answer (05/30/2024)   Received from [facility]  Tobacco Use: Low Risk  (07/01/2024)   SDOH Interventions:     Readmission Risk Interventions    07/01/2024    2:30 PM  Readmission Risk Prevention Plan  Transportation Screening Complete  HRI or Home Care Consult Complete  Social Work Consult for Recovery Care Planning/Counseling Not Complete  Palliative Care Screening Not Complete  Medication Review Oceanographer) Complete

## 2024-07-02 ENCOUNTER — Encounter (HOSPITAL_COMMUNITY): Payer: Self-pay | Admitting: Family Medicine

## 2024-07-02 DIAGNOSIS — R569 Unspecified convulsions: Secondary | ICD-10-CM | POA: Diagnosis not present

## 2024-07-02 DIAGNOSIS — R1310 Dysphagia, unspecified: Secondary | ICD-10-CM | POA: Diagnosis not present

## 2024-07-02 DIAGNOSIS — N3000 Acute cystitis without hematuria: Secondary | ICD-10-CM | POA: Diagnosis not present

## 2024-07-02 DIAGNOSIS — G9340 Encephalopathy, unspecified: Secondary | ICD-10-CM | POA: Diagnosis not present

## 2024-07-02 LAB — MAGNESIUM: Magnesium: 2.1 mg/dL (ref 1.7–2.4)

## 2024-07-02 LAB — CBC
HCT: 36.7 % — ABNORMAL LOW (ref 39.0–52.0)
Hemoglobin: 11.7 g/dL — ABNORMAL LOW (ref 13.0–17.0)
MCH: 31.9 pg (ref 26.0–34.0)
MCHC: 31.9 g/dL (ref 30.0–36.0)
MCV: 100 fL (ref 80.0–100.0)
Platelets: 297 K/uL (ref 150–400)
RBC: 3.67 MIL/uL — ABNORMAL LOW (ref 4.22–5.81)
RDW: 14.7 % (ref 11.5–15.5)
WBC: 9.3 K/uL (ref 4.0–10.5)
nRBC: 0 % (ref 0.0–0.2)

## 2024-07-02 LAB — BASIC METABOLIC PANEL WITH GFR
Anion gap: 11 (ref 5–15)
BUN: 21 mg/dL (ref 8–23)
CO2: 23 mmol/L (ref 22–32)
Calcium: 8.7 mg/dL — ABNORMAL LOW (ref 8.9–10.3)
Chloride: 103 mmol/L (ref 98–111)
Creatinine, Ser: 0.87 mg/dL (ref 0.61–1.24)
GFR, Estimated: >60 mL/min (ref >60)
Glucose, Bld: 114 mg/dL — ABNORMAL HIGH (ref 70–99)
Potassium: 4.2 mmol/L (ref 3.5–5.1)
Sodium: 137 mmol/L (ref 135–145)

## 2024-07-02 LAB — PHOSPHORUS: Phosphorus: 3.2 mg/dL (ref 2.5–4.6)

## 2024-07-02 LAB — MRSA CULTURE: Culture: NOT DETECTED

## 2024-07-02 LAB — GLUCOSE, CAPILLARY
Glucose-Capillary: 112 mg/dL — ABNORMAL HIGH (ref 70–99)
Glucose-Capillary: 114 mg/dL — ABNORMAL HIGH (ref 70–99)
Glucose-Capillary: 117 mg/dL — ABNORMAL HIGH (ref 70–99)
Glucose-Capillary: 120 mg/dL — ABNORMAL HIGH (ref 70–99)
Glucose-Capillary: 120 mg/dL — ABNORMAL HIGH (ref 70–99)
Glucose-Capillary: 124 mg/dL — ABNORMAL HIGH (ref 70–99)

## 2024-07-02 LAB — LEVETIRACETAM LEVEL: Levetiracetam Lvl: 16.5 ug/mL (ref 10.0–40.0)

## 2024-07-02 NOTE — Plan of Care (Signed)
  Problem: Education: Goal: Knowledge of General Education information will improve Description: Including pain rating scale, medication(s)/side effects and non-pharmacologic comfort measures Outcome: Not Progressing   Problem: Health Behavior/Discharge Planning: Goal: Ability to manage health-related needs will improve Outcome: Not Progressing   Problem: Clinical Measurements: Goal: Ability to maintain clinical measurements within normal limits will improve Outcome: Progressing Goal: Will remain free from infection Outcome: Progressing Goal: Diagnostic test results will improve Outcome: Progressing Goal: Respiratory complications will improve Outcome: Progressing Goal: Cardiovascular complication will be avoided Outcome: Progressing   Problem: Activity: Goal: Risk for activity intolerance will decrease Outcome: Not Progressing   Problem: Nutrition: Goal: Adequate nutrition will be maintained Outcome: Progressing   Problem: Coping: Goal: Level of anxiety will decrease Outcome: Progressing   Problem: Elimination: Goal: Will not experience complications related to bowel motility Outcome: Progressing Goal: Will not experience complications related to urinary retention Outcome: Progressing   Problem: Pain Managment: Goal: General experience of comfort will improve and/or be controlled Outcome: Progressing   Problem: Safety: Goal: Ability to remain free from injury will improve Outcome: Progressing   Problem: Skin Integrity: Goal: Risk for impaired skin integrity will decrease Outcome: Not Progressing

## 2024-07-02 NOTE — Progress Notes (Signed)
 Progress Note   Patient: Cory Pacheco FMW:[id number] DOB: 21-Nov-1946 DOA: 06/30/2024     2 DOS: the patient was seen and examined on 07/02/2024   Brief hospital admission narrative: As per H&P written by Dr. Charlton on 06/30/2024 Cory Pacheco is a 78 y.o. male with medical history significant for schizophrenia, Parkinson's disease, seizure disorder, atrial fibrillation not anticoagulated, dysphagia with PEG tube, neurogenic bladder with suprapubic catheter, OSA, and tracheostomy dependence who presents for evaluation of altered mental status.   Patient is accompanied by his wife who assists with the history.  Patient was discharged home from an LTAC roughly a month ago.  He has not been talking as much or responding as much as usual for the past week, was diagnosed with possible pneumonia on 06/27/2024 and prescribed doxycycline , but has continued to be poorly responsive at home.  Patient was not complaining of anything in particular prior to becoming less responsive.   ED Course: Upon arrival to the ED, patient is found to be afebrile and saturating 100% on 6 L/min of supplemental oxygen with mild tachypnea, normal HR, and SBP in the 90s and greater.  Labs are most notable for potassium 3.4, normal creatinine, albumin 2.7, normal WBC, undetectable ammonia, normal serum CK, and normal pCO2.  There are no acute findings on head CT or chest x-ray.   Patient was treated with acetaminophen  and Rocephin  in the ED.  Assessment and plan  1-acute metabolic encephalopathy - Suspected to be associated with UTI (patient at high risk for infection secondary to chronic indwelling catheter). - Follow-up results of EEG, TSH, RPR and B12 ordered to be thorough - Continue constant orientation and supportive care - CT scan of the head demonstrated no acute intracranial normalities and patient's ammonia level and CO2 within normal limits. - Continue empirical treatment with antibiotics and follow clinical  response. - Follow culture results.  2-tracheostomy and PEG tube dependency - Stable for the most part - Continue tracheostomy care and PEG tube feeding - Dietitian has been involved for continuation of nutrition.  3-schizophrenia - Continue home medications limiting as much as possible sedation agents - Continue supportive care and consult reorientation.  4-Parkinson's disease - Continue carbidopa  levodopa   5-seizure disorders - No seizure currently appreciated - Given metabolic encephalopathy changes EEG has been ordered and will follow result - Continue valproic  acid, Keppra  and Vimpat .  6-chronic atrial fibrillation - Not a candidate for anticoagulation - Continue amiodarone  and metoprolol  for rate control.  7-social/ethics - Patient would benefit of goals of care and advance care planning discussion - Overall very poor long-term prognosis - Palliative care consultation requested - Continue to treat was treatable.   Subjective:  Able to follow simple commands intermittently; no fever, chronically ill in appearance.  No nausea or vomiting reported.  Physical Exam: Vitals:   07/02/24 0700 07/02/24 0744 07/02/24 0800 07/02/24 0900  BP: (!) 102/51  125/62 (!) 106/56  Pulse: 86  81 82  Resp: (!) 24  20 (!) 0  Temp:  98.5 F (36.9 C)    TempSrc:  Axillary    SpO2: 100%  100% 100%  Weight:       General exam: Alert, able to follow simple commands; chronically ill in appearance.  Tracheostomy in place.  No fever Respiratory system: Diffuse rhonchi appreciated bilaterally.  No wheezing.  Trach in place. Cardiovascular system: Rate controlled; no rubs, no gallops, no JVD.  Positive murmur appreciated on exam. Gastrointestinal system: Abdomen is nondistended, soft and  no tenderness to palpation.  Positive bowel sounds.  PEG tube in place. Central nervous system: No new focal neurological deficits. Extremities: No cyanosis or clubbing. Skin: No petechiae. Psychiatry: Flat  affect; limited examination.  Data Reviewed: Basic metabolic panel: Sodium 137, potassium 4.2, chloride 103, bicarb 23, BUN 21, creatinine 0.87 GFR >60 CBC: WBCs 9.3, hemoglobin 11.7 and platelet count 2 97K Magnesium: 2.1 Phosphorus: 3.2 B12: 575 TSH: 4.234  MRSA PCR: Pending  Family Communication: No family at bedside.  Disposition: Status is: Inpatient Remains inpatient appropriate because: Continue IV therapy.   Time spent: 50 minutes  Author: Eric Nunnery, MD 07/02/2024 11:21 AM  For on call review [email or web address].

## 2024-07-02 NOTE — Plan of Care (Signed)
  Problem: Nutrition: Goal: Adequate nutrition will be maintained Outcome: Progressing   Problem: Clinical Measurements: Goal: Respiratory complications will improve Outcome: Not Progressing   Problem: Activity: Goal: Risk for activity intolerance will decrease Outcome: Not Progressing

## 2024-07-03 DIAGNOSIS — R569 Unspecified convulsions: Secondary | ICD-10-CM | POA: Diagnosis not present

## 2024-07-03 DIAGNOSIS — R1310 Dysphagia, unspecified: Secondary | ICD-10-CM | POA: Diagnosis not present

## 2024-07-03 DIAGNOSIS — G9340 Encephalopathy, unspecified: Secondary | ICD-10-CM | POA: Diagnosis not present

## 2024-07-03 DIAGNOSIS — N3000 Acute cystitis without hematuria: Secondary | ICD-10-CM | POA: Diagnosis not present

## 2024-07-03 LAB — CBC
HCT: 33.2 % — ABNORMAL LOW (ref 39.0–52.0)
Hemoglobin: 10.8 g/dL — ABNORMAL LOW (ref 13.0–17.0)
MCH: 32.6 pg (ref 26.0–34.0)
MCHC: 32.5 g/dL (ref 30.0–36.0)
MCV: 100.3 fL — ABNORMAL HIGH (ref 80.0–100.0)
Platelets: 259 K/uL (ref 150–400)
RBC: 3.31 MIL/uL — ABNORMAL LOW (ref 4.22–5.81)
RDW: 15 % (ref 11.5–15.5)
WBC: 7.4 K/uL (ref 4.0–10.5)
nRBC: 0 % (ref 0.0–0.2)

## 2024-07-03 LAB — BASIC METABOLIC PANEL WITH GFR
Anion gap: 4 — ABNORMAL LOW (ref 5–15)
BUN: 21 mg/dL (ref 8–23)
CO2: 27 mmol/L (ref 22–32)
Calcium: 8.6 mg/dL — ABNORMAL LOW (ref 8.9–10.3)
Chloride: 107 mmol/L (ref 98–111)
Creatinine, Ser: 0.75 mg/dL (ref 0.61–1.24)
GFR, Estimated: >60 mL/min (ref >60)
Glucose, Bld: 102 mg/dL — ABNORMAL HIGH (ref 70–99)
Potassium: 3.8 mmol/L (ref 3.5–5.1)
Sodium: 138 mmol/L (ref 135–145)

## 2024-07-03 LAB — GLUCOSE, CAPILLARY
Glucose-Capillary: 98 mg/dL (ref 70–99)
Glucose-Capillary: 107 mg/dL — ABNORMAL HIGH (ref 70–99)
Glucose-Capillary: 104 mg/dL — ABNORMAL HIGH (ref 70–99)
Glucose-Capillary: 124 mg/dL — ABNORMAL HIGH (ref 70–99)
Glucose-Capillary: 108 mg/dL — ABNORMAL HIGH (ref 70–99)

## 2024-07-03 LAB — PHOSPHORUS: Phosphorus: 2.9 mg/dL (ref 2.5–4.6)

## 2024-07-03 LAB — MAGNESIUM: Magnesium: 2 mg/dL (ref 1.7–2.4)

## 2024-07-03 NOTE — Plan of Care (Signed)
  Problem: Education: Goal: Knowledge of General Education information will improve Description: Including pain rating scale, medication(s)/side effects and non-pharmacologic comfort measures Outcome: Not Progressing   Problem: Health Behavior/Discharge Planning: Goal: Ability to manage health-related needs will improve Outcome: Not Progressing   Problem: Activity: Goal: Risk for activity intolerance will decrease Outcome: Not Progressing   Problem: Skin Integrity: Goal: Risk for impaired skin integrity will decrease Outcome: Not Progressing   

## 2024-07-03 NOTE — Plan of Care (Signed)
  Problem: Clinical Measurements: Goal: Cardiovascular complication will be avoided Outcome: Progressing   Problem: Pain Managment: Goal: General experience of comfort will improve and/or be controlled Outcome: Progressing   Problem: Activity: Goal: Risk for activity intolerance will decrease Outcome: Not Progressing

## 2024-07-03 NOTE — Progress Notes (Signed)
 Progress Note   Patient: Cory Pacheco FMW:[id number] DOB: [birth year] DOA: 06/30/2024     3 DOS: the patient was seen and examined on 07/03/2024   Brief hospital admission narrative: As per H&P written by Dr. Charlton on 06/30/2024 Cory Pacheco is a 78 y.o. male with medical history significant for schizophrenia, Parkinson's disease, seizure disorder, atrial fibrillation not anticoagulated, dysphagia with PEG tube, neurogenic bladder with suprapubic catheter, OSA, and tracheostomy dependence who presents for evaluation of altered mental status.   Patient is accompanied by his wife who assists with the history.  Patient was discharged home from an LTAC roughly a month ago.  He has not been talking as much or responding as much as usual for the past week, was diagnosed with possible pneumonia on 06/27/2024 and prescribed doxycycline , but has continued to be poorly responsive at home.  Patient was not complaining of anything in particular prior to becoming less responsive.   ED Course: Upon arrival to the ED, patient is found to be afebrile and saturating 100% on 6 L/min of supplemental oxygen with mild tachypnea, normal HR, and SBP in the 90s and greater.  Labs are most notable for potassium 3.4, normal creatinine, albumin 2.7, normal WBC, undetectable ammonia, normal serum CK, and normal pCO2.  There are no acute findings on head CT or chest x-ray.   Patient was treated with acetaminophen  and Rocephin  in the ED.  Assessment and plan  1-acute metabolic encephalopathy: - Suspected to be associated with gram-negative rods UTI (patient at high risk for infection secondary to chronic indwelling catheter). - Normal B12, TSH, RPR and no evidence of acute seizure appreciated.   - Continue constant re-orientation and supportive care - CT scan of the head demonstrated no acute intracranial normalities and patient's ammonia level and CO2 within normal limits. - Continue empirical treatment with  antibiotics, maintain adequate hydration and follow clinical response. - Follow final culture results.  2-tracheostomy and PEG tube dependency - Stable for the most part - Continue tracheostomy care and PEG tube feeding - Dietitian has been involved for continuation of nutrition.  3-schizophrenia - Continue home medications limiting as much as possible sedation agents - Continue supportive care and consult reorientation.  4-Parkinson's disease - Continue carbidopa  levodopa   5-seizure disorders - No seizure currently appreciated - Given metabolic encephalopathy changes EEG has been ordered and will follow result - Continue valproic  acid, Keppra  and Vimpat .  6-chronic atrial fibrillation - Not a candidate for anticoagulation - Continue amiodarone  and metoprolol  for rate control.  7-social/ethics - Patient would benefit of goals of care and advance care planning discussion - Overall very poor long-term prognosis - Palliative care consultation requested - Continue to treat was treatable.   Subjective:  Improved rhonchi sounds and less suctioning treatment through tracheostomy required.  Patient able to follow simple commands and per wife reports pretty much at his baseline.  No seizures appreciated.  Hemodynamically stable.  Physical Exam: Vitals:   07/03/24 0800 07/03/24 0825 07/03/24 1132 07/03/24 1244  BP: 124/63   118/60  Pulse: 88   78  Resp: 20     Temp: 98.5 F (36.9 C)  98.6 F (37 C)   TempSrc: Axillary  Oral   SpO2: 100% 100%    Weight:       General exam: Alert, awake, following simple commands intermittently.  No seizures Respiratory system: No using accessory muscles; tracheostomy in place.  Good saturation appreciated.  Positive rhonchi bilaterally (improved from previous examination). Cardiovascular system:  Rate controlled, no rubs, no gallops, no JVD. Gastrointestinal system: Abdomen is nondistended, soft and nontender.  Positive bowel sounds appreciated  on PEG tube in place. Central nervous system: No new focal neurological deficits. Extremities: No C/C/E, +pedal pulses Skin: No petechiae. Psychiatry: Limited examination; flat affect appreciated on exam.  Latest data Reviewed: Basic metabolic panel: Sodium 137, potassium 4.2, chloride 103, bicarb 23, BUN 21, creatinine 0.87 GFR >60 CBC: WBCs 9.3, hemoglobin 11.7 and platelet count 2 97K Magnesium: 2.1 Phosphorus: 3.2 B12: 575 TSH: 4.234  MRSA PCR: Negative  Family Communication: Wife updated over the phone  Disposition: Status is: Inpatient Remains inpatient appropriate because: Continue IV therapy.   Time spent: 50 minutes  Author: Eric Nunnery, MD 07/03/2024 1:17 PM  For on call review [email or web address].

## 2024-07-04 DIAGNOSIS — G20B1 Parkinson's disease with dyskinesia, without mention of fluctuations: Secondary | ICD-10-CM

## 2024-07-04 DIAGNOSIS — N3000 Acute cystitis without hematuria: Secondary | ICD-10-CM | POA: Diagnosis not present

## 2024-07-04 DIAGNOSIS — R569 Unspecified convulsions: Secondary | ICD-10-CM | POA: Diagnosis not present

## 2024-07-04 DIAGNOSIS — Z7189 Other specified counseling: Secondary | ICD-10-CM | POA: Diagnosis not present

## 2024-07-04 DIAGNOSIS — R1310 Dysphagia, unspecified: Secondary | ICD-10-CM

## 2024-07-04 DIAGNOSIS — G9340 Encephalopathy, unspecified: Secondary | ICD-10-CM

## 2024-07-04 LAB — CBC
HCT: 35.3 % — ABNORMAL LOW (ref 39.0–52.0)
Hemoglobin: 11.2 g/dL — ABNORMAL LOW (ref 13.0–17.0)
MCH: 31.7 pg (ref 26.0–34.0)
MCHC: 31.7 g/dL (ref 30.0–36.0)
MCV: 100 fL (ref 80.0–100.0)
Platelets: 248 K/uL (ref 150–400)
RBC: 3.53 MIL/uL — ABNORMAL LOW (ref 4.22–5.81)
RDW: 14.9 % (ref 11.5–15.5)
WBC: 7 K/uL (ref 4.0–10.5)
nRBC: 0 % (ref 0.0–0.2)

## 2024-07-04 LAB — GLUCOSE, CAPILLARY
Glucose-Capillary: 103 mg/dL — ABNORMAL HIGH (ref 70–99)
Glucose-Capillary: 109 mg/dL — ABNORMAL HIGH (ref 70–99)
Glucose-Capillary: 110 mg/dL — ABNORMAL HIGH (ref 70–99)
Glucose-Capillary: 88 mg/dL (ref 70–99)
Glucose-Capillary: 94 mg/dL (ref 70–99)
Glucose-Capillary: 97 mg/dL (ref 70–99)

## 2024-07-04 LAB — BASIC METABOLIC PANEL WITH GFR
Anion gap: 8 (ref 5–15)
BUN: 21 mg/dL (ref 8–23)
CO2: 26 mmol/L (ref 22–32)
Calcium: 8.5 mg/dL — ABNORMAL LOW (ref 8.9–10.3)
Chloride: 103 mmol/L (ref 98–111)
Creatinine, Ser: 0.82 mg/dL (ref 0.61–1.24)
GFR, Estimated: >60 mL/min (ref >60)
Glucose, Bld: 86 mg/dL (ref 70–99)
Potassium: 3.8 mmol/L (ref 3.5–5.1)
Sodium: 137 mmol/L (ref 135–145)

## 2024-07-04 LAB — VITAMIN B1: Vitamin B1 (Thiamine): 224.8 nmol/L — ABNORMAL HIGH (ref 66.5–200.0)

## 2024-07-04 NOTE — Plan of Care (Signed)

## 2024-07-04 NOTE — Plan of Care (Signed)
  Problem: Clinical Measurements: Goal: Ability to maintain clinical measurements within normal limits will improve Outcome: Progressing Goal: Will remain free from infection Outcome: Progressing Goal: Cardiovascular complication will be avoided Outcome: Progressing   Problem: Clinical Measurements: Goal: Respiratory complications will improve Outcome: Not Progressing

## 2024-07-04 NOTE — Progress Notes (Signed)
 Progress Note   Patient: Cory Pacheco FMW:[id number] DOB: 10-Jan-1946 DOA: 06/30/2024     4 DOS: the patient was seen and examined on 07/04/2024   Brief hospital admission narrative: As per H&P written by Dr. Charlton on 06/30/2024 Cory Pacheco [place] is a 78 y.o. male with medical history significant for schizophrenia, Parkinson's disease, seizure disorder, atrial fibrillation not anticoagulated, dysphagia with PEG tube, neurogenic bladder with suprapubic catheter, OSA, and tracheostomy dependence who presents for evaluation of altered mental status.   Patient is accompanied by his wife who assists with the history.  Patient was discharged home from an LTAC roughly a month ago.  He has not been talking as much or responding as much as usual for the past week, was diagnosed with possible pneumonia on 06/27/2024 and prescribed doxycycline , but has continued to be poorly responsive at home.  Patient was not complaining of anything in particular prior to becoming less responsive.   ED Course: Upon arrival to the ED, patient is found to be afebrile and saturating 100% on 6 L/min of supplemental oxygen with mild tachypnea, normal HR, and SBP in the 90s and greater.  Labs are most notable for potassium 3.4, normal creatinine, albumin 2.7, normal WBC, undetectable ammonia, normal serum CK, and normal pCO2.  There are no acute findings on head CT or chest x-ray.   Patient was treated with acetaminophen  and Rocephin  in the ED.  Assessment and plan  1-acute metabolic encephalopathy: - Suspected to be associated with gram-negative rods UTI (patient at high risk for infection secondary to chronic indwelling catheter). - Normal B12, TSH, RPR and no evidence of acute seizure appreciated.   - Continue constant re-orientation and supportive care - CT scan of the head demonstrated no acute intracranial normalities and patient's ammonia level and CO2 within normal limits. - Continue empirical treatment with  antibiotics, maintain adequate hydration and follow clinical response. - Follow final culture results.  2-tracheostomy and PEG tube dependency - Stable for the most part - Continue tracheostomy care and PEG tube feeding - Dietitian has been involved for continuation of nutrition.  3-schizophrenia - Continue home medications limiting as much as possible sedation agents - Continue supportive care and consult reorientation.  4-Parkinson's disease - Continue carbidopa  levodopa   5-seizure disorders - No seizure currently appreciated - Given metabolic encephalopathy changes EEG has been ordered and will follow result - Continue valproic  acid, Keppra  and Vimpat .  6-chronic atrial fibrillation - Not a candidate for anticoagulation - Continue amiodarone  and metoprolol  for rate control.  7-social/ethics - Patient would benefit of goals of care and advance care planning discussion - Overall very poor long-term prognosis - Palliative care consultation requested - Continue to treat was treatable.   Subjective:  No fever, no chest pain, no nausea or vomiting appreciated.  Good saturation with tracheostomy.  No overnight events.  Physical Exam: Vitals:   07/04/24 0900 07/04/24 0901 07/04/24 1000 07/04/24 1156  BP: 108/60 108/60 116/70   Pulse: 83 82 98   Resp: (!) 0 (!) 0 (!) 7   Temp:    98.3 F (36.8 C)  TempSrc:    Oral  SpO2: 100% 100% 100%   Weight:       General exam: No fever, no nausea or vomiting appreciated; able to follow simple commands intermittently.  No overnight events. Respiratory system: Positive scattered rhonchi; good saturation through tracheostomy.  No using accessory muscles. Cardiovascular system: Rate controlled, no rubs, no gallops, no JVD on exam. Gastrointestinal system: Abdomen  is nondistended, soft and without guarding.  PEG tube in place. Central nervous system: No new focal neurological deficits. Extremities: No cyanosis or clubbing.  Prevalon boots  in place. Skin: No petechiae. Psychiatry: Flat affect.  Latest data Reviewed: Basic metabolic panel: Sodium 137, potassium 4.2, chloride 103, bicarb 23, BUN 21, creatinine 0.87 GFR >60 CBC: WBCs 9.3, hemoglobin 11.7 and platelet count 2 97K Magnesium: 2.1 Phosphorus: 3.2 B12: 575 TSH: 4.234 MRSA PCR: Negative  Family Communication: Wife updated at bedside.  Disposition: Status is: Inpatient Remains inpatient appropriate because: Continue IV therapy.   Time spent: 50 minutes  Author: Eric Nunnery, MD 07/04/2024 4:51 PM  For on call review [email or web address].

## 2024-07-04 NOTE — Consult Note (Addendum)
 WOC Nurse Consult Note: Reason for Consult: R heel wound  Wound type: unstageable Pressure Injury R heel  Pressure Injury POA: Yes Measurement: see nursing flowsheet  Wound bed: black eschar with small area of brown necrotic tissue Drainage (amount, consistency, odor) see nursing flowsheet  Periwound: mild erythema  Dressing procedure/placement/frequency:  Cleanse R heel wound with soap and water , dry and apply Xeroform gauze ([id number] [id number]) to wound bed daily, cover with dry gauze and Kerlix roll gauze. Place R foot in Prevalon boot to offload pressure.    POC discussed with bedside nurse. WOC team will not follow. Re-consult if further needs arise. It appears patient has home health for ongoing assessment of wound.  May benefit from referral to wound care center/orthopedics if ongoing aggressive treatment desired.    Thank you,    Powell Bar MSN, RN-BC, [employer]

## 2024-07-04 NOTE — Consult Note (Addendum)
 Consultation Note Date: 07/05/2024   Patient Name: Cory Pacheco  DOB: May 27, 1946  MRN: [id number]  Age / Sex: 78 y.o., male  PCP: Delores Corean Pollen, MD Referring Physician: Ricky Fines, MD  Reason for Consultation:  goals of care, advanced care planning  HPI/Patient Profile: 78 y.o. male  with past medical history of schizophrenia, Parkinson's, seizure d/o, a-fib, dysphagia with PEG tube, nuerogenic bladder with suprapubic catheter, tracheostomy admitted on 06/30/2024 with altered mental status. Workup reveals UTI. Palliative medicine consulted for goals of care.    Primary Decision Maker NEXT OF KIN - spouse Naod Sweetland  Discussion: Chart reviewed including labs, progress notes, imaging from this and previous encounters.  Reviewed notes from [place] providers and previous admissions. CBC today no significant findings. BMET with hypocalcemia. Urine culture positive for >100,000 colonies gram negative rods.  On evaluation patient is alert, looking at ceiling. Some tremors present. Does not respond to my voice.  I called and spoke with patient's spouse. She has been providing care at home for patient with much support from multiple care agencies. [facility] has [facility] support and is also followed by outpatient Palliative providers. [name] shares that patient has bounced back many times from his hospitalizations. She is well versed  in his medical needs.  Advanced Care Planning- 30 mins With permission I discussed advanced care planning with [name] due to patient's comorbidities and likelihood to continue to decline.  Patient has MOST form completed on chart so we reviewed this.  As for code status- MOST form lists patient as full code. [name] shares that if patient does not have pulse and is not breathing and has died- she would not want him to have CPR- she would prefer he be allowed to remain at  peace. However, she does want all other efforts made to keep him having a pulse and breathing- including artificial ventilation.  We discussed transition of code status to DNR. [name] shared she worried that if he had a DNR in place and she called the ambulance, then they wouldn't treat him. I reviewed with her that DNR only addresses if he has no pulse, is not breathing and requires CPR. [name] is in agreement with changing code status to DNR.  As for all other interventions she wants patient to have full scope care.  She notes that as long as he is breathing and looking her in the eye she will continue to do things to prolong his life.    SUMMARY OF RECOMMENDATIONS -UTI/sepsis in setting of great debility- pt with trach/PEG- continue to treat what is treatable -Code status changed to DNR- pre-arrest interventions desired including artificial ventilation if needed to keep heart beating and keep patient breathing- continue all other full scope interventions    Code Status/Advance Care Planning:   Code Status: Do not attempt resuscitation (DNR) PRE-ARREST INTERVENTIONS DESIRED    Prognosis:   Unable to determine  Discharge Planning: To Be Determined  Primary Diagnoses: Present on Admission:  Acute encephalopathy  Undifferentiated schizophrenia (HCC)  Paroxysmal atrial fibrillation (  HCC)  Parkinson's disease with dyskinesia, 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  Constitutional:      Appearance: He is ill-appearing.  Cardiovascular:     Rate and Rhythm: Normal rate.  Pulmonary:     Effort: Pulmonary effort is normal.     Comments: Trach in place Skin:    Coloration: Skin is pale.  Neurological:     Mental Status: He is alert.     Comments: tremors     Vital Signs: BP (!) 106/56 (BP  Location: Left Arm)   Pulse 78   Temp 98.2 F (36.8 C) (Axillary)   Resp 12   Wt 109.1 kg   SpO2 98%   BMI 33.55 kg/m  Pain Scale: CPOT   Pain Score: 0-No pain   SpO2: SpO2: 98 % O2 Device:SpO2: 98 % O2 Flow Rate: .O2 Flow Rate (L/min): 6 L/min  IO: Intake/output summary:  Intake/Output Summary (Last 24 hours) at 07/05/2024 1029 Last data filed at 07/05/2024 0911 Gross per 24 hour  Intake 1103 ml  Output 1050 ml  Net 53 ml    LBM: Last BM Date : 07/04/24 Baseline Weight: Weight: 107.6 kg Most recent weight: Weight: 109.1 kg       Thank you for this consult. Palliative medicine will continue to follow and assist as needed.   Signed by: Cassondra Stain, AGNP-C Palliative Medicine  Time includes:   Preparing to see the patient (e.g., review of tests) Obtaining and/or reviewing separately obtained history Performing a medically necessary appropriate examination and/or evaluation Counseling and educating the patient/family/caregiver Ordering medications, tests, or procedures Referring and communicating with other health care professionals (when not reported separately) Documenting clinical information in the electronic or other health record Independently interpreting results (not reported separately) and communicating results to the patient/family/caregiver Care coordination (not reported separately) Clinical documentation   Please contact Palliative Medicine Team phone at [phone] for questions and concerns.  For individual provider: See Tracey

## 2024-07-05 DIAGNOSIS — N3000 Acute cystitis without hematuria: Secondary | ICD-10-CM | POA: Diagnosis not present

## 2024-07-05 DIAGNOSIS — G20B1 Parkinson's disease with dyskinesia, without mention of fluctuations: Secondary | ICD-10-CM | POA: Diagnosis not present

## 2024-07-05 DIAGNOSIS — Z7189 Other specified counseling: Secondary | ICD-10-CM | POA: Diagnosis not present

## 2024-07-05 DIAGNOSIS — G9340 Encephalopathy, unspecified: Secondary | ICD-10-CM | POA: Diagnosis not present

## 2024-07-05 LAB — GLUCOSE, CAPILLARY
Glucose-Capillary: 101 mg/dL — ABNORMAL HIGH (ref 70–99)
Glucose-Capillary: 105 mg/dL — ABNORMAL HIGH (ref 70–99)
Glucose-Capillary: 106 mg/dL — ABNORMAL HIGH (ref 70–99)
Glucose-Capillary: 106 mg/dL — ABNORMAL HIGH (ref 70–99)
Glucose-Capillary: 106 mg/dL — ABNORMAL HIGH (ref 70–99)
Glucose-Capillary: 111 mg/dL — ABNORMAL HIGH (ref 70–99)
Glucose-Capillary: 82 mg/dL (ref 70–99)

## 2024-07-05 MED ORDER — SODIUM CHLORIDE 0.9 % IV SOLN
2.0000 g | Freq: Three times a day (TID) | INTRAVENOUS | Status: DC
  Administered 2024-07-05 – 2024-07-06 (×4): 2 g via INTRAVENOUS
  Filled 2024-07-05 (×4): qty 12.5

## 2024-07-05 NOTE — Plan of Care (Signed)
  Problem: Clinical Measurements: Goal: Diagnostic test results will improve Outcome: Progressing   Problem: Nutrition: Goal: Adequate nutrition will be maintained Outcome: Progressing   Problem: Coping: Goal: Level of anxiety will decrease Outcome: Progressing   Problem: Elimination: Goal: Will not experience complications related to urinary retention Outcome: Progressing   Problem: Safety: Goal: Ability to remain free from injury will improve Outcome: Progressing   Problem: Skin Integrity: Goal: Risk for impaired skin integrity will decrease Outcome: Progressing

## 2024-07-05 NOTE — Progress Notes (Signed)
 Progress Note   Patient: Cory Pacheco FMW:[id number] DOB: 05-15-1946 DOA: 06/30/2024     5 DOS: the patient was seen and examined on 07/05/2024   Brief hospital admission narrative: As per H&P written by Dr. Charlton on 06/30/2024 Cory Pacheco is a 78 y.o. male with medical history significant for schizophrenia, Parkinson's disease, seizure disorder, atrial fibrillation not anticoagulated, dysphagia with PEG tube, neurogenic bladder with suprapubic catheter, OSA, and tracheostomy dependence who presents for evaluation of altered mental status.   Patient is accompanied by his wife who assists with the history.  Patient was discharged home from an LTAC roughly a month ago.  He has not been talking as much or responding as much as usual for the past week, was diagnosed with possible pneumonia on 06/27/2024 and prescribed doxycycline , but has continued to be poorly responsive at home.  Patient was not complaining of anything in particular prior to becoming less responsive.   ED Course: Upon arrival to the ED, patient is found to be afebrile and saturating 100% on 6 L/min of supplemental oxygen with mild tachypnea, normal HR, and SBP in the 90s and greater.  Labs are most notable for potassium 3.4, normal creatinine, albumin 2.7, normal WBC, undetectable ammonia, normal serum CK, and normal pCO2.  There are no acute findings on head CT or chest x-ray.   Patient was treated with acetaminophen  and Rocephin  in the ED.  Assessment and plan  1-acute metabolic encephalopathy secondary to catheter related UTI: - Suspected to be associated with gram-negative rods UTI (patient at high risk for infection secondary to chronic indwelling suprapubic catheter). - Normal B12, TSH, RPR and no evidence of acute seizure appreciated.   - Continue constant re-orientation and supportive care - CT scan of the head demonstrated no acute intracranial normalities and patient's ammonia level and CO2 within normal  limits. - Continue empirical treatment with antibiotics (antibiotics narrowed to cefepime ), maintain adequate hydration and follow clinical response. - Follow final culture results; so far demonstrating the presence of Proteus mirabilis and Pseudomonas aeruginosa; sensitivity pending..  2-tracheostomy and PEG tube dependency - Stable for the most part - Continue tracheostomy care and PEG tube feeding - Dietitian has been involved for continuation of nutrition.  3-schizophrenia - Continue home medications limiting as much as possible sedation agents - Continue supportive care and consult reorientation.  4-Parkinson's disease - Continue carbidopa  levodopa   5-seizure disorders - No seizure currently appreciated - Given metabolic encephalopathy changes EEG has been ordered and will follow result - Continue valproic  acid, Keppra  and Vimpat .  6-chronic atrial fibrillation - Not a candidate for anticoagulation - Continue amiodarone  and metoprolol  for rate control.  7-social/ethics - Patient would benefit of goals of care and advance care planning discussion - Overall very poor long-term prognosis - Palliative care consultation requested - Continue to treat was treatable.   Subjective:  No fever, no chest pain, no nausea vomiting.  No overnight events.  In no acute distress.  Physical Exam: Vitals:   07/05/24 1200 07/05/24 1240 07/05/24 1500 07/05/24 1600  BP: 96/62  109/77 120/67  Pulse: 87   80  Resp: 10   19  Temp:  98.2 F (36.8 C)    TempSrc:  Axillary    SpO2: 99%   100%  Weight:       General exam: Alert, awake, more interactive and able to follow simple commands.  No fever.  Chronically ill in appearance. Respiratory system: Good saturation on chronic trach supplementation.  Positive  scattered rhonchi.  No wheezing, no using accessory muscles. Cardiovascular system: Rate controlled, no rubs, no gallops, no JVD. Gastrointestinal system: Abdomen is nondistended, soft  and nontender.  PEG tube in place.  Suprapubic catheter in place. Central nervous system: No new focal neurological deficits. Extremities: No cyanosis, clubbing or edema. Skin: No petechiae; unstageable pressure injury on his right heel pressure at time of admission.  Prevalon boots in place. Psychiatry: Limited examination; flat affect on exam.  Latest data Reviewed: Basic metabolic panel: Sodium 137, potassium 4.2, chloride 103, bicarb 23, BUN 21, creatinine 0.87 GFR >60 CBC: WBCs 9.3, hemoglobin 11.7 and platelet count 2 97K Magnesium: 2.1 Phosphorus: 3.2 B12: 575 TSH: 4.234 MRSA PCR: Negative  Family Communication: Wife updated at bedside.  Disposition: Status is: Inpatient Remains inpatient appropriate because: Continue IV therapy.   Time spent: 50 minutes  Author: Eric Nunnery, MD 07/05/2024 5:21 PM  For on call review [email or web address].

## 2024-07-05 NOTE — Progress Notes (Signed)
 Daily Progress Note   Patient Name: Cory Pacheco       Date: 07/05/2024 DOB: 1946/09/28  Age: 78 y.o. MRN#: [id number] Attending Physician: Ricky Fines, MD Primary Care Physician: Delores Corean Pollen, MD Admit Date: 06/30/2024  Reason for Consultation/Follow-up: Establishing goals of care  Patient Profile/HPI:   78 y.o. male  with past medical history of schizophrenia, Parkinson's, seizure d/o, a-fib, dysphagia with PEG tube, nuerogenic bladder with suprapubic catheter, tracheostomy admitted on 06/30/2024 with altered mental status. Workup reveals UTI. Palliative medicine consulted for goals of care.   Subjective: Chart reviewed including labs, progress notes, imaging from this and previous encounters.  Urine growing proteus mirabilis and pseudomonas aeruginosa. Sensitivities pending.  No family at bedside. Patient in bed, eyes closed, doesn't respond to voice. R hand/arm tremors.    Review of Systems  Unable to perform ROS: Mental status change     Physical Exam Cardiovascular:     Rate and Rhythm: Normal rate.             Vital Signs: BP 93/65   Pulse 81   Temp 98.2 F (36.8 C) (Axillary)   Resp 20   Wt 109.1 kg   SpO2 100%   BMI 33.55 kg/m  SpO2: SpO2: 100 % O2 Device: O2 Device: Tracheostomy Collar O2 Flow Rate: O2 Flow Rate (L/min): 6 L/min  Intake/output summary:  Intake/Output Summary (Last 24 hours) at 07/05/2024 1524 Last data filed at 07/05/2024 1524 Gross per 24 hour  Intake 1203 ml  Output 500 ml  Net 703 ml   LBM: Last BM Date : 07/04/24 Baseline Weight: Weight: 107.6 kg Most recent weight: Weight: 109.1 kg       Palliative Assessment/Data: PPS: 10%      Patient Active Problem List   Diagnosis Date Noted   UTI (urinary tract  infection) 07/01/2024   Acute encephalopathy 06/30/2024   Bipolar disorder (HCC) 09/22/2023   Conversion disorder with seizures or convulsions 09/22/2023   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   Dysphagia, unspecified 09/22/2023   Mixed hyperlipidemia 09/22/2023   Need for assistance with personal care 09/22/2023   Obesity 09/22/2023   Malignant melanoma (HCC) 09/22/2023   Parkinson's disease with dyskinesia, without mention of fluctuations (HCC) 09/22/2023   Paroxysmal atrial fibrillation (HCC) 09/22/2023   Personal history of pulmonary embolism  09/22/2023   Status post thoracic spinal fusion 09/22/2023   Actinic keratosis 09/22/2023   Altered mental status 09/22/2023   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   Bilateral pseudophakia 09/22/2023   History of DVT (deep vein thrombosis) 09/22/2023   Neurogenic bladder 09/22/2023   Chronic retention of urine 09/22/2023   Chronic suprapubic catheter (HCC) 09/22/2023   Kidney stones 09/22/2023   Renal cyst 09/22/2023   Moderate protein-calorie malnutrition (HCC) 04/03/2023   Chronic heart failure with preserved ejection fraction (HCC) 04/02/2023   Gastroesophageal reflux disease without esophagitis 04/02/2023   OSA (obstructive sleep apnea) 04/02/2023   Cerebral atrophy (HCC) 04/02/2023   Permanent atrial fibrillation with RVR (HCC) 04/02/2023   Presence of externally removable percutaneous endoscopic gastrostomy (PEG) tube (HCC) 04/02/2023   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   Pressure injury of skin 05/06/2017   Seizure (HCC) 05/05/2017    Palliative Care Assessment & Plan    Assessment/Recommendations/Plan  UTI in setting of patient with Parkinson's, dementia- continue current interventions, d/c home with spouse Spouse declines outpatient Palliative support- has [facility] care agencies and [facility]   Code Status:   Code Status: Do not attempt resuscitation (DNR) PRE-ARREST INTERVENTIONS DESIRED    Prognosis:  Unable to determine  Discharge Planning: Home with Home Health   Thank you for allowing the Palliative Medicine Team to assist in the care of this patient.  Total time:  Prolonged billing:  Time includes:   Preparing to see the patient (e.g., review of tests) Obtaining and/or reviewing separately obtained history Performing a medically necessary appropriate examination and/or evaluation Counseling and educating the patient/family/caregiver Ordering medications, tests, or procedures Referring and communicating with other health care professionals (when not reported separately) Documenting clinical information in the electronic or other health record Independently interpreting results (not reported separately) and communicating results to the patient/family/caregiver Care coordination (not reported separately) Clinical documentation  Cassondra Stain, AGNP-C Palliative Medicine   Please contact Palliative Medicine Team phone at [phone] for questions and concerns.

## 2024-07-05 NOTE — Plan of Care (Signed)
  Problem: Coping: Goal: Level of anxiety will decrease Outcome: Progressing   Problem: Pain Managment: Goal: General experience of comfort will improve and/or be controlled Outcome: Progressing   Problem: Education: Goal: Knowledge of General Education information will improve Description: Including pain rating scale, medication(s)/side effects and non-pharmacologic comfort measures Outcome: Not Progressing   Problem: Skin Integrity: Goal: Risk for impaired skin integrity will decrease Outcome: Not Progressing

## 2024-07-06 DIAGNOSIS — G9340 Encephalopathy, unspecified: Secondary | ICD-10-CM | POA: Diagnosis not present

## 2024-07-06 LAB — BASIC METABOLIC PANEL WITH GFR
Anion gap: 6 (ref 5–15)
BUN: 29 mg/dL — ABNORMAL HIGH (ref 8–23)
CO2: 28 mmol/L (ref 22–32)
Calcium: 8.6 mg/dL — ABNORMAL LOW (ref 8.9–10.3)
Chloride: 104 mmol/L (ref 98–111)
Creatinine, Ser: 0.87 mg/dL (ref 0.61–1.24)
GFR, Estimated: >60 mL/min (ref >60)
Glucose, Bld: 115 mg/dL — ABNORMAL HIGH (ref 70–99)
Potassium: 4.5 mmol/L (ref 3.5–5.1)
Sodium: 138 mmol/L (ref 135–145)

## 2024-07-06 LAB — GLUCOSE, CAPILLARY
Glucose-Capillary: 104 mg/dL — ABNORMAL HIGH (ref 70–99)
Glucose-Capillary: 115 mg/dL — ABNORMAL HIGH (ref 70–99)
Glucose-Capillary: 121 mg/dL — ABNORMAL HIGH (ref 70–99)

## 2024-07-06 LAB — CBC
HCT: 37.7 % — ABNORMAL LOW (ref 39.0–52.0)
Hemoglobin: 12 g/dL — ABNORMAL LOW (ref 13.0–17.0)
MCH: 32.3 pg (ref 26.0–34.0)
MCHC: 31.8 g/dL (ref 30.0–36.0)
MCV: 101.3 fL — ABNORMAL HIGH (ref 80.0–100.0)
Platelets: 251 K/uL (ref 150–400)
RBC: 3.72 MIL/uL — ABNORMAL LOW (ref 4.22–5.81)
RDW: 15.4 % (ref 11.5–15.5)
WBC: 7.7 K/uL (ref 4.0–10.5)
nRBC: 0 % (ref 0.0–0.2)

## 2024-07-06 LAB — URINE CULTURE: Culture: >=100000 — AB

## 2024-07-06 MED ORDER — METOPROLOL TARTRATE 25 MG PO TABS: 12.5000 mg | ORAL_TABLET | Freq: Four times a day (QID) | ORAL | 1 refills | Status: AC

## 2024-07-06 MED ORDER — CIPROFLOXACIN HCL 500 MG PO TABS: 500.0000 mg | ORAL_TABLET | Freq: Two times a day (BID) | ORAL | 0 refills | Status: AC

## 2024-07-06 MED ORDER — VALPROATE SODIUM 250 MG/5ML PO SOLN: 250.0000 mg | Freq: Three times a day (TID) | ORAL | 0 refills | Status: AC

## 2024-07-06 MED ORDER — CIPROFLOXACIN HCL 500 MG PO TABS: 500.0000 mg | ORAL_TABLET | Freq: Two times a day (BID) | ORAL | 0 refills | Status: DC

## 2024-07-06 NOTE — TOC Transition Note (Signed)
 Transition of Care [facility]) - Discharge Note   Patient Details  Name: Cory Pacheco MRN: [id number] Date of Birth: 1946-03-20  [facility] CM/SW Contact:  Sharlyne Stabs, RN Phone Number: 07/06/2024, 11:24 AM   Clinical Narrative:   Patient discharging back home. Wife states he will need EMS and resumption orders for [facility] home health for PT/OT/SW. MD aware to order. RN will call EMS when discharge is completed and updated that wife needs a copy for DC paper work and his belongings from the room. Wife will be home. CM updated Darice with [facility] with discharge plan.     Final next level of care: Home w Home Health Services Barriers to Discharge: Barriers Resolved   Patient Goals and CMS Choice Patient states their goals for this hospitalization and ongoing recovery are:: Return Home CMS Medicare.gov Compare Post Acute Care list provided to:: Patient Represenative (must comment) Choice offered to / list presented to : Spouse [place] ownership interest in [facility].provided to:: Spouse    Discharge Placement                Patient to be transferred to facility by: EMS Name of family member notified: wife Patient and family notified of of transfer: 07/06/24  Discharge Plan and Services Additional resources added to the After Visit Summary for         Social Drivers of Health (SDOH) Interventions SDOH Screenings   Food Insecurity: Patient Unable To Answer (07/01/2024)  Housing: Patient Unable To Answer (07/01/2024)  Transportation Needs: Patient Unable To Answer (07/01/2024)  Utilities: Patient Unable To Answer (07/01/2024)  Financial Resource Strain: Low Risk  (03/24/2024)   Received from [facility]  Physical Activity: Inactive (04/03/2023)   Received from [facility]  Social Connections: Patient Unable To Answer (07/01/2024)  Stress: Patient Unable To Answer (05/30/2024)   Received from [facility]  Tobacco Use: Low Risk   (07/01/2024)     Readmission Risk Interventions    07/01/2024    2:30 PM  Readmission Risk Prevention Plan  Transportation Screening Complete  HRI or Home Care Consult Complete  Social Work Consult for Recovery Care Planning/Counseling Not Complete  Palliative Care Screening Not Complete  Medication Review [employer]) Complete

## 2024-07-06 NOTE — Hospital Course (Signed)
 Cory Pacheco is a 78 y.o. male with medical history significant for schizophrenia, Parkinson's disease, seizure disorder, atrial fibrillation not anticoagulated, dysphagia with PEG tube, neurogenic bladder with suprapubic catheter, OSA, and tracheostomy dependence who presents for evaluation of altered mental status.   Patient is accompanied by his wife who assists with the history.  Patient was discharged home from an LTAC roughly a month ago.  He has not been talking as much or responding as much as usual for the past week, was diagnosed with possible pneumonia on 06/27/2024 and prescribed doxycycline , but has continued to be poorly responsive at home.  Patient was not complaining of anything in particular prior to becoming less responsive.   ED Course: Upon arrival to the ED, patient is found to be afebrile and saturating 100% on 6 L/min of supplemental oxygen with mild tachypnea, normal HR, and SBP in the 90s and greater.  Labs are most notable for potassium 3.4, normal creatinine, albumin 2.7, normal WBC, undetectable ammonia, normal serum CK, and normal pCO2.  There are no acute findings on head CT or chest x-ray.   Patient was treated with acetaminophen  and Rocephin  in the ED.

## 2024-07-06 NOTE — Discharge Summary (Signed)
 Physician Discharge Summary   Patient: Cory Pacheco MRN: [id number] DOB: 05-23-1946  Admit date:     06/30/2024  Discharge date: 07/06/24  Discharge Physician: Adriana DELENA Grams   PCP: Delores Corean Pollen, MD   Recommendations at discharge:   Follow-up with the PCP in 1-2 weeks Continue follow-up with the urologist as scheduled Follow-up with the palliative care   Discharge Diagnoses: Principal Problem:   Acute encephalopathy Active Problems:   Seizure (HCC)   Dysphagia, unspecified   OSA (obstructive sleep apnea)   Parkinson's disease with dyskinesia, without mention of fluctuations (HCC)   Paroxysmal atrial fibrillation (HCC)   Undifferentiated schizophrenia (HCC)   UTI (urinary tract infection)  Resolved Problems:   * No resolved hospital problems. *  Hospital Course: Cory Pacheco is a 78 y.o. male with medical history significant for schizophrenia, Parkinson's disease, seizure disorder, atrial fibrillation not anticoagulated, dysphagia with PEG tube, neurogenic bladder with suprapubic catheter, OSA, and tracheostomy dependence who presents for evaluation of altered mental status.   Patient is accompanied by his wife who assists with the history.  Patient was discharged home from an LTAC roughly a month ago.  He has not been talking as much or responding as much as usual for the past week, was diagnosed with possible pneumonia on 06/27/2024 and prescribed doxycycline , but has continued to be poorly responsive at home.  Patient was not complaining of anything in particular prior to becoming less responsive.   ED Course: Upon arrival to the ED, patient is found to be afebrile and saturating 100% on 6 L/min of supplemental oxygen with mild tachypnea, normal HR, and SBP in the 90s and greater.  Labs are most notable for potassium 3.4, normal creatinine, albumin 2.7, normal WBC, undetectable ammonia, normal serum CK, and normal pCO2.  There are no acute findings on head  CT or chest x-ray.   Patient was treated with acetaminophen  and Rocephin  in the ED.    1-acute metabolic encephalopathy secondary to catheter related UTI: - Suspected to be associated with gram-negative rods UTI (patient at high risk for infection secondary to chronic indwelling suprapubic catheter). - Normal B12, TSH, RPR and no evidence of acute seizure appreciated.   - Continue constant re-orientation and supportive care - CT scan of the head demonstrated no acute intracranial normalities and patient's ammonia level and CO2 within normal limits.  - On antibiotics (antibiotics narrowed to cefepime ), maintain adequate hydration and follow clinical response. - Urine culture revealed Proteus/Pseudomonas (multidrug resistance) -  Switched to ciprofloxacin  for 3 more days this will conclude 10 days of antibiotic coverage   tracheostomy and PEG tube dependency - Stable for the most part - Continue tracheostomy care and PEG tube feeding - Dietitian has been involved for continuation of nutrition.   schizophrenia - Continue home medications limiting as much as possible sedation agents - Continue supportive care and consult reorientation.   4-Parkinson's disease - Continue carbidopa  levodopa    seizure disorders - No seizure currently appreciated - Given metabolic encephalopathy changes EEG has been ordered and will follow result - Continue valproic  acid, Keppra  and Vimpat .   chronic atrial fibrillation - Not a candidate for anticoagulation - Continue amiodarone  and metoprolol  for rate control.   social/ethics - Patient would benefit of goals of care and advance care planning discussion - Overall very poor long-term prognosis -DNR - Palliative care follow-up as an outpatient             Consultants: Palliative care team  Disposition: Home with home health Diet recommendation:  NPO continue tube feeds DISCHARGE MEDICATION: Allergies as of 07/06/2024       Reactions    Penicillins Hives, Dermatitis, Other (See Comments)   Childhood reaction Patient has tolerated cefepime  and Rocephin  Pt's spouse stated it doesn't do anything for him   Azithromycin Dermatitis, Other (See Comments)   Altered mental status   Sulfa Antibiotics Hives, Dermatitis, Other (See Comments)   Pt unsure of reaction   Bupropion Dermatitis, Other (See Comments)   Patient and pt wife unsure of reaction   Metformin Diarrhea, Dermatitis, Other (See Comments)   Patient not sure        Medication List     STOP taking these medications    doxycycline  100 MG capsule Commonly known as: VIBRAMYCIN    folic acid  1 MG tablet Commonly known as: FOLVITE    omeprazole 20 MG Tbdd disintegrating tablet   risperiDONE 1 MG/ML oral solution Commonly known as: RISPERDAL   vitamin C 1000 MG tablet   zinc sulfate (50mg  elemental zinc) 220 (50 Zn) MG capsule       TAKE these medications    acetaminophen  160 MG/5ML elixir Commonly known as: TYLENOL  Take 650 mg by mouth every 6 (six) hours as needed for fever or pain.   acetic acid  0.25 % irrigation Irrigate with as directed 2 (two) times daily. Instill 60cc twice a day through suprapubic tube to keep catheter patent   AMBULATORY NON FORMULARY MEDICATION Home health nurse may exchange 22 french suprapubic catheter (SP tube) at home as needed.   carbidopa -levodopa  25-100 MG disintegrating tablet Commonly known as: PARCOPA  Take 2 tablets by mouth 4 (four) times daily.   ciprofloxacin  500 MG tablet Commonly known as: Cipro  Take 1 tablet (500 mg total) by mouth 2 (two) times daily for 4 days. Start taking on: July 07, 2024   clotrimazole  1 % cream Commonly known as: LOTRIMIN  Apply 1 application. topically 2 (two) times daily.   glycopyrrolate  1 MG tablet Commonly known as: ROBINUL  Take 1 mg by mouth 2 (two) times daily. Secretions   lacosamide  10 MG/ML oral solution Commonly known as: VIMPAT  Take 20 mLs by mouth 2 (two)  times daily. 20 ml by mouth twice a day   lansoprazole  30 MG disintegrating tablet Commonly known as: PREVACID  SOLUTAB Take 30 mg by mouth 2 (two) times daily.   levETIRAcetam  500 MG tablet Commonly known as: KEPPRA  Take 500 mg by mouth 2 (two) times daily.   metoprolol  tartrate 25 MG tablet Commonly known as: LOPRESSOR  Place 0.5 tablets (12.5 mg total) into feeding tube every 6 (six) hours. What changed:  medication strength how much to take how to take this when to take this   mirtazapine 7.5 MG tablet Commonly known as: REMERON Take 7.5 mg by mouth at bedtime.   nystatin powder Commonly known as: MYCOSTATIN/NYSTOP Apply 1 application topically 3 (three) times daily.   OVER THE COUNTER MEDICATION Apply 1 Application topically daily. Apply for diaper rash Desyten cream   Pacerone  200 MG tablet Generic drug: amiodarone  Take 200 mg by mouth daily.   polyethylene glycol 17 g packet Commonly known as: MIRALAX  / GLYCOLAX  Take 17 g by mouth daily.   protein supplement Liqd Place 30 mLs into feeding tube daily.   feeding supplement (OSMOLITE 1.2 CAL) Liqd Place 1,000 mLs into feeding tube daily. 2 quarts every day   scopolamine  1 MG/3DAYS Commonly known as: TRANSDERM-SCOP Place 1 patch onto the skin every 3 (three)  days as needed (nausea/motion sickness).   Tab-A-Vite/Iron/Beta Carotene Tabs Take 1 tablet by mouth daily.   thiamine 100 MG tablet Commonly known as: VITAMIN B1 Take 100 mg by mouth daily.   Valproate Sodium  250 MG/5ML Soln solution Commonly known as: DEPAKENE  Place 5 mLs (250 mg total) into feeding tube 3 (three) times daily. What changed:  medication strength how much to take when to take this   vitamin B-12 100 MCG tablet Commonly known as: CYANOCOBALAMIN Take 100 mcg by mouth daily.               Discharge Care Instructions  (From admission, onward)           Start     Ordered   07/06/24 0000  Discharge wound care:        Comments: Wound care  Daily      Comments: Cleanse R heel wound with soap and water , dry and apply Xeroform gauze ([id number] [phone]) to wound bed daily, cover with dry gauze and Kerlix roll gauze. Place R foot in Prevalon boot to offload pressure Soila [phone])   07/06/24 1122            Discharge Exam: Filed Weights   07/04/24 0300 07/05/24 0500 07/06/24 0500  Weight: 108.2 kg 109.1 kg 109.5 kg        General:  Somnolent,,  cooperative, no distress;  Chronically ill looking male with tracheostomy, colostomy, suprapubic catheter, chronic tube feed through PEG tube  HEENT:  Tracheostomy -somnolent, PERRL,   Neuro:  Limited exam -    Lungs:   Clear to auscultation BL, Respirations unlabored,  No wheezes / crackles  Cardio:    S1/S2, RRR, No murmure, No Rubs or Gallops   Abdomen:  Soft, non-tender, bowel sounds active all four quadrants, no guarding or peritoneal signs.  Muscular  skeletal:  Bedbound-  Limited exam -global generalized weaknesses - Suprapubic Foley catheter 2+ pulses,  symmetric, No pitting edema  Skin:  Dry, warm to touch, pressure wounds  Suprapubic Foley catheter wounds: Please see nursing documentation           Condition at discharge: poor  The results of significant diagnostics from this hospitalization (including imaging, microbiology, ancillary and laboratory) are listed below for reference.   Imaging Studies: CT Head Wo Contrast Result Date: 06/30/2024 CLINICAL DATA:  Altered mental status EXAM: CT HEAD WITHOUT CONTRAST TECHNIQUE: Contiguous axial images were obtained from the base of the skull through the vertex without intravenous contrast. RADIATION DOSE REDUCTION: This exam was performed according to the departmental dose-optimization program which includes automated exposure control, adjustment of the mA and/or kV according to patient size and/or use of iterative reconstruction technique. COMPARISON:  MRI 03/02/2024, CT brain 02/26/2024  FINDINGS: Brain: No acute territorial infarction, hemorrhage or intracranial mass. Moderate atrophy. Moderate chronic small vessel ischemic changes of the white matter. Stable ventricle size. Vascular: No hyperdense vessels. Vertebral and carotid vascular calcification. Skull: Normal. Negative for fracture or focal lesion. Sinuses/Orbits: Moderate mucosal thickening in the sinuses Other: None IMPRESSION: 1. No CT evidence for acute intracranial abnormality. 2. Atrophy and chronic small vessel ischemic changes of the white matter. Electronically Signed   By: Luke Bun M.D.   On: 06/30/2024 18:59   DG Chest Portable 1 View Result Date: 06/30/2024 CLINICAL DATA:  Altered mental status. EXAM: PORTABLE CHEST 1 VIEW COMPARISON:  CT chest 05/19/2024 FINDINGS: Heart is enlarged. Tip of the tracheostomy is unchanged in position. The lungs are clear. There  is no pleural effusion or pneumothorax. No acute fractures are seen. IMPRESSION: Cardiomegaly. No acute cardiopulmonary process. Electronically Signed   By: Greig Pique M.D.   On: 06/30/2024 18:06    Microbiology: Results for orders placed or performed during the hospital encounter of 06/30/24  Urine Culture (for pregnant, neutropenic or urologic patients or patients with an indwelling urinary catheter)     Status: Abnormal   Collection Time: 06/30/24  6:01 PM   Specimen: Urine, Catheterized  Result Value Ref Range Status   Specimen Description   Final    URINE, CATHETERIZED Performed at [facility], [address], [place], [place] [address]    Special Requests   Final    NONE Performed at [facility], [address], [place], [place] [address]    Culture (A)  Final    >=100,000 COLONIES/mL PROTEUS MIRABILIS >=100,000 COLONIES/mL PSEUDOMONAS AERUGINOSA    Report Status 07/06/2024 FINAL  Final   Organism ID, Bacteria PROTEUS MIRABILIS (A)  Final   Organism ID, Bacteria PSEUDOMONAS AERUGINOSA (A)  Final      Susceptibility   Pseudomonas  aeruginosa - MIC*    CEFTAZIDIME 4 SENSITIVE Sensitive     CIPROFLOXACIN  <=0.25 SENSITIVE Sensitive     GENTAMICIN 2 SENSITIVE Sensitive     IMIPENEM 1 SENSITIVE Sensitive     PIP/TAZO 8 SENSITIVE Sensitive ug/mL    CEFEPIME  2 SENSITIVE Sensitive     * >=100,000 COLONIES/mL PSEUDOMONAS AERUGINOSA   Proteus mirabilis - MIC*    AMPICILLIN >=32 RESISTANT Resistant     CEFEPIME  <=0.12 SENSITIVE Sensitive     CEFTRIAXONE  <=0.25 SENSITIVE Sensitive     CIPROFLOXACIN  <=0.25 SENSITIVE Sensitive     GENTAMICIN <=1 SENSITIVE Sensitive     IMIPENEM 8 INTERMEDIATE Intermediate     NITROFURANTOIN RESISTANT Resistant     TRIMETH/SULFA <=20 SENSITIVE Sensitive     AMPICILLIN/SULBACTAM 16 INTERMEDIATE Intermediate     PIP/TAZO <=4 SENSITIVE Sensitive ug/mL    * >=100,000 COLONIES/mL PROTEUS MIRABILIS  MRSA culture     Status: None   Collection Time: 07/01/24  3:28 AM   Specimen: Nasal; Body Fluid  Result Value Ref Range Status   Specimen Description   Final    NASAL SWAB Performed at [facility], [address] [place], [place] [address]    Special Requests   Final    NONE Performed at [facility], [address], [place], [place] [address]    Culture   Final    NO MRSA DETECTED Performed at [facility], [address], [place], [place] [address]    Report Status 07/02/2024 FINAL  Final    Labs: CBC: Recent Labs  Lab 06/30/24 1808 07/01/24 0441 07/02/24 0231 07/03/24 0516 07/04/24 0442 07/06/24 0717  WBC 10.3 7.8 9.3 7.4 7.0 7.7  NEUTROABS 7.2  --   --   --   --   --   HGB 11.8* 12.0* 11.7* 10.8* 11.2* 12.0*  HCT 35.9* 37.9* 36.7* 33.2* 35.3* 37.7*  MCV 97.8 98.4 100.0 100.3* 100.0 101.3*  PLT 320 292 297 259 248 251   Basic Metabolic Panel: Recent Labs  Lab 07/01/24 0441 07/02/24 0231 07/03/24 0516 07/04/24 0442 07/06/24 0717  NA 136 137 138 137 138  K 3.7 4.2 3.8 3.8 4.5  CL 100 103 107 103 104  CO2 26 23 27 26 28   GLUCOSE 94 114* 102* 86 115*  BUN 21 21  21 21  29*  CREATININE 0.92 0.87 0.75 0.82  0.87  CALCIUM 8.7* 8.7* 8.6* 8.5* 8.6*  MG 2.1 2.1 2.0  --   --   PHOS  --  3.2 2.9  --   --    Liver Function Tests: Recent Labs  Lab 06/30/24 1808  AST 21  ALT <5  ALKPHOS 117  BILITOT 0.6  PROT 8.6*  ALBUMIN 2.7*   CBG: Recent Labs  Lab 07/05/24 1543 07/05/24 2022 07/05/24 2341 07/06/24 0414 07/06/24 0736  GLUCAP 101* 106* 106* 104* 115*    Discharge time spent: greater than 55 minutes.  Signed: Adriana DELENA Grams, MD [facility] 07/06/2024

## 2024-07-06 NOTE — Plan of Care (Signed)

## 2024-07-08 DIAGNOSIS — G20C Parkinsonism, unspecified: Secondary | ICD-10-CM | POA: Diagnosis not present

## 2024-07-08 DIAGNOSIS — G9341 Metabolic encephalopathy: Secondary | ICD-10-CM | POA: Diagnosis not present

## 2024-07-08 DIAGNOSIS — N390 Urinary tract infection, site not specified: Secondary | ICD-10-CM | POA: Diagnosis not present

## 2024-07-08 DIAGNOSIS — Z515 Encounter for palliative care: Secondary | ICD-10-CM | POA: Diagnosis not present

## 2024-07-14 ENCOUNTER — Ambulatory Visit: Admitting: Internal Medicine

## 2024-07-14 DIAGNOSIS — F3132 Bipolar disorder, current episode depressed, moderate: Secondary | ICD-10-CM | POA: Diagnosis not present

## 2024-07-14 DIAGNOSIS — I10 Essential (primary) hypertension: Secondary | ICD-10-CM | POA: Diagnosis not present

## 2024-07-14 DIAGNOSIS — I4821 Permanent atrial fibrillation: Secondary | ICD-10-CM | POA: Diagnosis not present

## 2024-07-14 DIAGNOSIS — G20A1 Parkinson's disease without dyskinesia, without mention of fluctuations: Secondary | ICD-10-CM | POA: Diagnosis not present

## 2024-07-21 DIAGNOSIS — I10 Essential (primary) hypertension: Secondary | ICD-10-CM | POA: Diagnosis not present

## 2024-07-21 DIAGNOSIS — Z789 Other specified health status: Secondary | ICD-10-CM | POA: Diagnosis not present

## 2024-07-21 DIAGNOSIS — K219 Gastro-esophageal reflux disease without esophagitis: Secondary | ICD-10-CM | POA: Diagnosis not present

## 2024-07-21 DIAGNOSIS — I4821 Permanent atrial fibrillation: Secondary | ICD-10-CM | POA: Diagnosis not present

## 2024-07-21 DIAGNOSIS — G20A1 Parkinson's disease without dyskinesia, without mention of fluctuations: Secondary | ICD-10-CM | POA: Diagnosis not present

## 2024-07-21 DIAGNOSIS — G40909 Epilepsy, unspecified, not intractable, without status epilepticus: Secondary | ICD-10-CM | POA: Diagnosis not present

## 2024-07-21 DIAGNOSIS — J301 Allergic rhinitis due to pollen: Secondary | ICD-10-CM | POA: Diagnosis not present

## 2024-07-21 DIAGNOSIS — F3132 Bipolar disorder, current episode depressed, moderate: Secondary | ICD-10-CM | POA: Diagnosis not present

## 2024-07-21 DIAGNOSIS — Z7409 Other reduced mobility: Secondary | ICD-10-CM | POA: Diagnosis not present

## 2024-07-21 DIAGNOSIS — K5909 Other constipation: Secondary | ICD-10-CM | POA: Diagnosis not present

## 2024-07-25 DIAGNOSIS — G20A1 Parkinson's disease without dyskinesia, without mention of fluctuations: Secondary | ICD-10-CM | POA: Diagnosis not present

## 2024-07-25 DIAGNOSIS — G40909 Epilepsy, unspecified, not intractable, without status epilepticus: Secondary | ICD-10-CM | POA: Diagnosis not present

## 2024-07-25 DIAGNOSIS — R1310 Dysphagia, unspecified: Secondary | ICD-10-CM | POA: Diagnosis not present

## 2024-07-25 DIAGNOSIS — L89610 Pressure ulcer of right heel, unstageable: Secondary | ICD-10-CM | POA: Diagnosis not present

## 2024-07-25 DIAGNOSIS — T83518D Infection and inflammatory reaction due to other urinary catheter, subsequent encounter: Secondary | ICD-10-CM | POA: Diagnosis not present

## 2024-07-25 DIAGNOSIS — I5030 Unspecified diastolic (congestive) heart failure: Secondary | ICD-10-CM | POA: Diagnosis not present

## 2024-07-25 DIAGNOSIS — E46 Unspecified protein-calorie malnutrition: Secondary | ICD-10-CM | POA: Diagnosis not present

## 2024-07-25 DIAGNOSIS — I480 Paroxysmal atrial fibrillation: Secondary | ICD-10-CM | POA: Diagnosis not present

## 2024-07-25 DIAGNOSIS — J9601 Acute respiratory failure with hypoxia: Secondary | ICD-10-CM | POA: Diagnosis not present

## 2024-07-25 DIAGNOSIS — B964 Proteus (mirabilis) (morganii) as the cause of diseases classified elsewhere: Secondary | ICD-10-CM | POA: Diagnosis not present

## 2024-07-25 DIAGNOSIS — B965 Pseudomonas (aeruginosa) (mallei) (pseudomallei) as the cause of diseases classified elsewhere: Secondary | ICD-10-CM | POA: Diagnosis not present

## 2024-07-25 DIAGNOSIS — G9341 Metabolic encephalopathy: Secondary | ICD-10-CM | POA: Diagnosis not present

## 2024-07-28 ENCOUNTER — Ambulatory Visit (INDEPENDENT_AMBULATORY_CARE_PROVIDER_SITE_OTHER): Admitting: Internal Medicine

## 2024-07-28 ENCOUNTER — Ambulatory Visit (INDEPENDENT_AMBULATORY_CARE_PROVIDER_SITE_OTHER): Admitting: Otolaryngology

## 2024-07-28 ENCOUNTER — Encounter (INDEPENDENT_AMBULATORY_CARE_PROVIDER_SITE_OTHER): Payer: Self-pay | Admitting: Otolaryngology

## 2024-07-28 ENCOUNTER — Encounter: Payer: Self-pay | Admitting: Internal Medicine

## 2024-07-28 VITALS — BP 105/69 | HR 68 | Ht 71.0 in

## 2024-07-28 VITALS — BP 142/84 | HR 78 | Ht 71.0 in | Wt 241.0 lb

## 2024-07-28 DIAGNOSIS — R400 Somnolence: Secondary | ICD-10-CM

## 2024-07-28 DIAGNOSIS — R059 Cough, unspecified: Secondary | ICD-10-CM

## 2024-07-28 DIAGNOSIS — R0600 Dyspnea, unspecified: Secondary | ICD-10-CM | POA: Diagnosis not present

## 2024-07-28 DIAGNOSIS — Z930 Tracheostomy status: Secondary | ICD-10-CM | POA: Diagnosis not present

## 2024-07-28 NOTE — Progress Notes (Signed)
 Dear Dr. Delores, Here is my assessment for our mutual patient, Cory Pacheco. Thank you for allowing me the opportunity to care for your patient. Please do not hesitate to contact me should you have any other questions. Sincerely, Dr. Eldora Blanch  Otolaryngology Clinic Note Referring provider: Dr. Delores HPI:  Cory Pacheco is a 78 y.o. male kindly referred by Dr. 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  Initial visit (07/2024): History of hypoxic respiratory failure with tracheostomy in March 2025 at [facility]. Cory Pacheco noted to be too short, requiring 7 cuffed adjustable bivona at 13 cm. Since then, has done well from trach standpoint - no accidental decannulation. Is able to talk around trach per caregiver, no discolored secretions. He Is on trach mask currently. Last trach change late April it appears. He is now at home. Of note, does have some baseline diminshed mental status and not talking during visit. No PNA. Multiple admissions recently for AMS and respiratory failure. PEG dependent. History obtained from caregiver given some somnolence  Personal or FHx of bleeding dz or anesthesia difficulty: no   PMHx: Schizophrenia, Parkinson's, Seizure, A-fib, Dysphagia, Neurogenic Bladder, OSA, Respiratory failure with tracheostomy dependence.  Independent Review of Additional Tests or Records:  Dr. Roark (ENT) - h/o trach 03/09/2025 at [facility] with trach change on 03/28/2024 at [facility]. He has had an accidental decannulation and was noted to have a short trachea and adjustable Bivona placed without difficulty.  Dr. Burnie (03/09/2024): Noted to have respiratory failure and tracheostomy performed [facility] discharge documents from admission (07/06/2024): admitted for altered mental status, at LTAC with poorly responsive. Tracheostomy without issue. Noted to have poor long-term prognosis.  RT notes reviewed: noted to have & Bivona just over 13 cm mark, with last tracheostomy change 04/09/2024. Off vent since  04/18/2024. Normally on tracheostomy collar at 28% Labs CBC and CMP 07/06/2024: WBC 7.7, Hgb 12.0; BUN/Cr wnl   PMH/Meds/All/SocHx/FamHx/ROS:   Past Medical History:  Diagnosis Date   Acute kidney injury (HCC)    Anxiety    Arthritis    Atrial fibrillation (HCC)    Depression    Heart failure (HCC)    Hypertension    Neurogenic bladder    OSA (obstructive sleep apnea)    Parkinson's disease (HCC)    Schizophrenia (HCC)    Seizure (HCC)      Past Surgical History:  Procedure Laterality Date   CHOLECYSTECTOMY     COLONOSCOPY     COLONOSCOPY N/A 08/03/2013   Procedure: COLONOSCOPY;  Surgeon: Claudis RAYMOND Rivet, MD;  Location: [facility];  Service: Endoscopy;  Laterality: N/A;  930   TONSILLECTOMY      Family History  Problem Relation Age of Onset   Cancer Sister    Sudden death Neg Hx    Sickle cell trait Neg Hx    Lupus Neg Hx    Anesthesia problems Neg Hx      Social Connections: Patient Unable To Answer (07/01/2024)   Social Connection and Isolation Panel    Frequency of Communication with Friends and Family: Patient unable to answer    Frequency of Social Gatherings with Friends and Family: Patient unable to answer    Attends Religious Services: Patient unable to answer    Active Member of Clubs or Organizations: Patient unable to answer    Attends Banker Meetings: Patient unable to answer    Marital Status: Patient unable to answer      Current Outpatient Medications:    acetaminophen  (TYLENOL ) 160  MG/5ML elixir, Take 650 mg by mouth every 6 (six) hours as needed for fever or pain., Disp: , Rfl:    acetic acid  0.25 % irrigation, Irrigate with as directed 2 (two) times daily. Instill 60cc twice a day through suprapubic tube to keep catheter patent, Disp: 500 mL, Rfl: 12   AMBULATORY NON FORMULARY MEDICATION, Home health nurse may exchange 22 french suprapubic catheter (SP tube) at home as needed., Disp: 1 each, Rfl: PRN   carbidopa -levodopa  (PARCOPA )  25-100 MG disintegrating tablet, Take 2 tablets by mouth 4 (four) times daily., Disp: , Rfl:    clotrimazole  (LOTRIMIN ) 1 % cream, Apply 1 application. topically 2 (two) times daily., Disp: 30 g, Rfl: 0   glycopyrrolate  (ROBINUL ) 1 MG tablet, Take 1 mg by mouth 2 (two) times daily. Secretions, Disp: , Rfl:    lacosamide  (VIMPAT ) 10 MG/ML oral solution, Take 20 mLs by mouth 2 (two) times daily. 20 ml by mouth twice a day, Disp: , Rfl:    lansoprazole  (PREVACID  SOLUTAB) 30 MG disintegrating tablet, Take 30 mg by mouth 2 (two) times daily., Disp: , Rfl:    levETIRAcetam  (KEPPRA ) 500 MG tablet, Take 500 mg by mouth 2 (two) times daily., Disp: , Rfl:    metoprolol  tartrate (LOPRESSOR ) 25 MG tablet, Place 0.5 tablets (12.5 mg total) into feeding tube every 6 (six) hours., Disp: 120 tablet, Rfl: 1   mirtazapine (REMERON) 7.5 MG tablet, Take 7.5 mg by mouth at bedtime., Disp: , Rfl:    Multiple Vitamins-Iron (TAB-A-VITE/IRON/BETA CAROTENE) TABS, Take 1 tablet by mouth daily., Disp: , Rfl:    Nutritional Supplements (FEEDING SUPPLEMENT, OSMOLITE 1.2 CAL,) LIQD, Place 1,000 mLs into feeding tube daily. 2 quarts every day, Disp: , Rfl:    nystatin (MYCOSTATIN/NYSTOP) powder, Apply 1 application topically 3 (three) times daily., Disp: , Rfl:    OVER THE COUNTER MEDICATION, Apply 1 Application topically daily. Apply for diaper rash Desyten cream, Disp: , Rfl:    PACERONE  200 MG tablet, Take 200 mg by mouth daily., Disp: , Rfl:    polyethylene glycol (MIRALAX  / GLYCOLAX ) 17 g packet, Take 17 g by mouth daily., Disp: , Rfl:    protein supplement (PROSOURCE NO CARB) LIQD, Place 30 mLs into feeding tube daily., Disp: , Rfl:    scopolamine  (TRANSDERM-SCOP) 1 MG/3DAYS, Place 1 patch onto the skin every 3 (three) days as needed (nausea/motion sickness)., Disp: , Rfl:    thiamine (VITAMIN B1) 100 MG tablet, Take 100 mg by mouth daily., Disp: , Rfl:    Valproate Sodium  (DEPAKENE ) 250 MG/5ML SOLN solution, Place 5 mLs  (250 mg total) into feeding tube 3 (three) times daily., Disp: 450 mL, Rfl: 0   vitamin B-12 (CYANOCOBALAMIN) 100 MCG tablet, Take 100 mcg by mouth daily., Disp: , Rfl:   Current Facility-Administered Medications:    silver  nitrate applicators applicator 1 Application, 1 Application, Topical, Once, [place], Julienne Annette, PA-C   Physical Exam:   BP 105/69 (BP Location: Right Arm, Patient Position: Sitting, Cuff Size: Large)   Pulse 68   Ht 5' 11 (1.803 m)   SpO2 96%   BMI 33.67 kg/m   Salient findings:  Quite sleepy, does not answer to name but caregiver reports this occurs intermittently and at baseline  Bilateral EAC clear and TM intact with well pneumatized middle ear spaces Anterior rhinoscopy: Septum intact; bilateral inferior turbinates without significant hypertrophy No lesions of oral cavity/oropharynx; dry oral cavity No obviously palpable neck masses/lymphadenopathy/thyromegaly No respiratory distress or stridor;  Bivona #7 cuffed trach in place, cuff down, easily able to mumble around it with good air flow. No significant peristomal breakdown; TFL was indicated to better evaluate the proximal airway, given the patient's history and exam findings, and is detailed below. Able to finger plug.  Seprately Identifiable Procedures:  Prior to initiating any procedures, risks/benefits/alternatives were explained to the patient and verbal consent obtained. Procedure Note Pre-procedure diagnosis:  Tracheostomy dependence, assess airway Post-procedure diagnosis: Same Procedure: Transnasal Fiberoptic Laryngoscopy, CPT 31575 - Mod 25 Indication: see above Complications: None apparent EBL: 0 mL  The procedure was undertaken to further evaluate the patient's complaint above, with mirror exam inadequate for appropriate examination due to gag reflex and poor patient tolerance  Procedure:  Patient was identified as correct patient. Verbal consent was obtained. The nose was sprayed  with oxymetazoline and 4% lidocaine. The The flexible laryngoscope was passed through the nose to view the nasal cavity, pharynx (oropharynx, hypopharynx) and larynx.  The larynx was examined  Documentation was obtained and reviewed with caregiver The scope was removed and then reinserted through the tracheostomy tract and tracheoscopy was performed. Scope was then removed. The patient tolerated the procedure well.  Findings: The nasal cavity and nasopharynx did not reveal any masses or lesions, mucosa appeared to be without obvious lesions. The tongue base, pharyngeal walls, piriform sinuses, vallecula, epiglottis and postcricoid region are normal in appearance. The visualized portion of the subglottis and proximal trachea is widely patent (only able to visualize up to tracheostomy) The vocal folds are mobile bilaterally. There are no lesions on the free edge of the vocal folds nor elsewhere in the larynx worrisome for malignancy. Tracheostomy in good position without backwalling, easy view to carina, patent  Electronically signed by: Eldora KATHEE Blanch, MD 07/28/2024 9:00 AM   Impression & Plans:  Cory Pacheco is a 78 y.o. male with:  1. Tracheostomy dependence (HCC)   2. Somnolence    Noted tracheostomy performed in March 2025 for respiratory failure. Doing well from tracheostomy standpoint, #7 cuffed bivona adjustable length at 13 cm. They have a backup but it is not adjustable length. Supplies through [facility].  He is on a trach mask currently. We discussed can likely have an HME or even a dual care possibly. Given his mental status, however, do not think he is an appropriate candidate for decannulation.  Will see him back in 6 months, but otherwise will refer to Jeralyn Banner for assistance with tracheostomy management given he already follows pulm.  See below regarding exact medications prescribed this encounter including dosages and route: No orders of the defined types were placed in this  encounter.     Thank you for allowing me the opportunity to care for your patient. Please do not hesitate to contact me should you have any other questions.  Sincerely, Eldora Blanch, MD Otolaryngologist (ENT), [facility] Phone: [phone] Fax: [phone]  07/28/2024, 9:00 AM   MDM:  Level 4 - [phone] Complexity/Problems addressed: mod - chronic stable problems Data complexity: mod -independent review of notes, labs, independent historian - Morbidity: low currently  - Prescription Drug prescribed or managed: no

## 2024-07-28 NOTE — Progress Notes (Signed)
 Cory Pacheco, male    DOB: [birth year]    MRN: [id number]   Brief patient profile:  6  yowm trach dep due to asp  referred to pulmonary clinic in [place]  07/28/2024 by Lynwood Arrow  for f/u from admit:   Pt last seen by [facility] 05/05/17 as inpt   Expand All Collapse All      Physician Discharge Summary    Patient: Cory Pacheco MRN: [id number] DOB: 1946/05/29  Admit date:     06/30/2024  Discharge date: 07/06/24        Recommendations at discharge:    Follow-up with the PCP in 1-2 weeks Continue follow-up with the urologist as scheduled Follow-up with the palliative care    Discharge Diagnoses: Principal Problem:   Acute encephalopathy   Seizure (HCC)   Dysphagia, unspecified   OSA (obstructive sleep apnea)   Parkinson's disease with dyskinesia, without mention of fluctuations (HCC)   Paroxysmal atrial fibrillation (HCC)   Undifferentiated schizophrenia (HCC)   UTI (urinary tract infection)   Hospital Course: Cory Pacheco is a 78 y.o. male with medical history significant for schizophrenia, Parkinson's disease, seizure disorder, atrial fibrillation not anticoagulated, dysphagia with PEG tube, neurogenic bladder with suprapubic catheter, OSA, and tracheostomy dependence who presents for evaluation of altered mental status.   Patient is accompanied by his wife who assists with the history.  Patient was discharged home from an LTAC roughly a month ago.  He has not been talking as much or responding as much as usual for the past week, was diagnosed with possible pneumonia on 06/27/2024 and prescribed doxycycline , but has continued to be poorly responsive at home.  Patient was not complaining of anything in particular prior to becoming less responsive.   ED Course: Upon arrival to the ED, patient is found to be afebrile and saturating 100% on 6 L/min of supplemental oxygen with mild tachypnea, normal HR, and SBP in the 90s and greater.  Labs are most notable for  potassium 3.4, normal creatinine, albumin 2.7, normal WBC, undetectable ammonia, normal serum CK, and normal pCO2.  There are no acute findings on head CT or chest x-ray.   Patient was treated with acetaminophen  and Rocephin  in the ED.       1-acute metabolic encephalopathy secondary to catheter related UTI: - Suspected to be associated with gram-negative rods UTI (patient at high risk for infection secondary to chronic indwelling suprapubic catheter). - Normal B12, TSH, RPR and no evidence of acute seizure appreciated.   - Continue constant re-orientation and supportive care - CT scan of the head demonstrated no acute intracranial normalities and patient's ammonia level and CO2 within normal limits.   - On antibiotics (antibiotics narrowed to cefepime ), maintain adequate hydration and follow clinical response. - Urine culture revealed Proteus/Pseudomonas (multidrug resistance) -  Switched to ciprofloxacin  for 3 more days this will conclude 10 days of antibiotic coverage   tracheostomy and PEG tube dependency - Stable for the most part - Continue tracheostomy care and PEG tube feeding - Dietitian has been involved for continuation of nutrition.   schizophrenia - Continue home medications limiting as much as possible sedation agents - Continue supportive care and consult reorientation.   4-Parkinson's disease - Continue carbidopa  levodopa    seizure disorders - No seizure currently appreciated - Given metabolic encephalopathy changes EEG has been ordered and will follow result - Continue valproic  acid, Keppra  and Vimpat .   chronic atrial fibrillation - Not a candidate for anticoagulation -  Continue amiodarone  and metoprolol  for rate control.   social/ethics - Patient would benefit of goals of care and advance care planning discussion - Overall very poor long-term prognosis -DNR - Palliative care follow-up as an outpatient                 History of Present Illness   07/28/2024  Pulmonary/ 1st Pacheco eval/ Cory Pacheco / Cory Pacheco / still DNR statusif heart stops  Chief Complaint  Patient presents with   Establish Care    Lung aspiration   Trach dep since April 2025 / lives in [place] with 24h care  Sometimes he helps with assistance but other times remains immobile with AMS  Dyspnea:  very little activity / on his own  Cough: white mucus  Sleep: hosp 45 degrees one pillow  SABA use: none  02: no oxygen, just humidity / suctioning trach least 4-6 x per 24/7     No obvious day to day or daytime pattern/variability or assoc   hemoptysis or cp or chest tightness, subjective wheeze or overt sinus or hb symptoms.    Also denies any obvious fluctuation of symptoms with weather or environmental changes or other aggravating or alleviating factors except as outlined above   No unusual exposure hx or h/o childhood pna/ asthma or knowledge of premature birth.  Current Allergies, Complete Past Medical History, Past Surgical History, Family History, and Social History were reviewed in [employer] record.  ROS  The following are not active complaints unless bolded Hoarseness, sore throat, dysphagia, dental problems, itching, sneezing,  nasal congestion or discharge of excess mucus or purulent secretions, ear ache,   fever, chills, sweats, unintended wt loss or wt gain, classically pleuritic or exertional cp,  orthopnea pnd or arm/hand swelling  or leg swelling, presyncope, palpitations, abdominal pain, anorexia, nausea, vomiting, diarrhea  or change in bowel habits or change in bladder habits, change in stools or change in urine, dysuria, hematuria,  rash, arthralgias, visual complaints, headache, numbness, weakness or ataxia or problems with walking or coordination,  change in mood or  memory.            Outpatient Medications Prior to Visit  Medication Sig Dispense Refill   acetaminophen  (TYLENOL ) 160 MG/5ML elixir Take 650 mg by mouth  every 6 (six) hours as needed for fever or pain.     acetic acid  0.25 % irrigation Irrigate with as directed 2 (two) times daily. Instill 60cc twice a day through suprapubic tube to keep catheter patent 500 mL 12   AMBULATORY NON FORMULARY MEDICATION Home health nurse may exchange 22 french suprapubic catheter (SP tube) at home as needed. 1 each PRN   carbidopa -levodopa  (PARCOPA ) 25-100 MG disintegrating tablet Take 2 tablets by mouth 4 (four) times daily.     clotrimazole  (LOTRIMIN ) 1 % cream Apply 1 application. topically 2 (two) times daily. 30 g 0   glycopyrrolate  (ROBINUL ) 1 MG tablet Take 1 mg by mouth 2 (two) times daily. Secretions     lacosamide  (VIMPAT ) 10 MG/ML oral solution Take 20 mLs by mouth 2 (two) times daily. 20 ml by mouth twice a day     lansoprazole  (PREVACID  SOLUTAB) 30 MG disintegrating tablet Take 30 mg by mouth 2 (two) times daily.     levETIRAcetam  (KEPPRA ) 500 MG tablet Take 500 mg by mouth 2 (two) times daily.     metoprolol  tartrate (LOPRESSOR ) 25 MG tablet Place 0.5 tablets (12.5 mg total) into feeding tube every 6 (six)  hours. 120 tablet 1   mirtazapine (REMERON) 7.5 MG tablet Take 7.5 mg by mouth at bedtime.     Multiple Vitamins-Iron (TAB-A-VITE/IRON/BETA CAROTENE) TABS Take 1 tablet by mouth daily.     Nutritional Supplements (FEEDING SUPPLEMENT, OSMOLITE 1.2 CAL,) LIQD Place 1,000 mLs into feeding tube daily. 2 quarts every day     nystatin (MYCOSTATIN/NYSTOP) powder Apply 1 application topically 3 (three) times daily.     omeprazole (PRILOSEC) 40 MG capsule Take 40 mg by mouth daily.     OVER THE COUNTER MEDICATION Apply 1 Application topically daily. Apply for diaper rash Desyten cream     PACERONE  200 MG tablet Take 200 mg by mouth daily.     polyethylene glycol (MIRALAX  / GLYCOLAX ) 17 g packet Take 17 g by mouth daily.     protein supplement (PROSOURCE NO CARB) LIQD Place 30 mLs into feeding tube daily.     risperiDONE (RISPERDAL M-TABS) 2 MG disintegrating  tablet Take 2 mg by mouth at bedtime.     scopolamine  (TRANSDERM-SCOP) 1 MG/3DAYS Place 1 patch onto the skin every 3 (three) days as needed (nausea/motion sickness).     thiamine (VITAMIN B1) 100 MG tablet Take 100 mg by mouth daily.     Valproate Sodium  (DEPAKENE ) 250 MG/5ML SOLN solution Place 5 mLs (250 mg total) into feeding tube 3 (three) times daily. 450 mL 0   vitamin B-12 (CYANOCOBALAMIN) 100 MCG tablet Take 100 mcg by mouth daily.     Facility-Administered Medications Prior to Visit  Medication Dose Route Frequency Provider Last Rate Last Admin   silver  nitrate applicators applicator 1 Application  1 Application Topical Once [place], Julienne Annette, PA-C        Past Medical History:  Diagnosis Date   Acute kidney injury (HCC)    Anxiety    Arthritis    Atrial fibrillation (HCC)    Depression    Heart failure (HCC)    Hypertension    Neurogenic bladder    OSA (obstructive sleep apnea)    Parkinson's disease (HCC)    Schizophrenia (HCC)    Seizure (HCC)       Objective:     BP (!) 142/84   Pulse 78   Ht 5' 11 (1.803 m)   Wt 241 lb (109.3 kg)   SpO2 97% Comment: ra  BMI 33.61 kg/m   SpO2: 97 % (ra) w/c bound elderly wm not responsive to verbal stimuli   HEENT : Oropharynx  not cooperative with exam          NECK :  without  apparent JVD/ palpable Nodes/TM / trach in place with site clean and dry and looks good.   LUNGS: no acc muscle use,  Nl contour chest which is clear to A and P bilaterally without cough on insp or exp maneuvers though BS are distant    CV:  RRR  no s3 or murmur or increase in P2, and no edema   ABD:  soft and nontender with feeding tube and suprapubic catheter  MS:    without deformities Or obvious joint restrictions  apparent  calf tenderness, cyanosis or clubbing    SKIN: warm and dry without lesions  from w/c exam   NEURO:  no resp to verbal.   I personally reviewed images and agree with radiology impression as follows:   CXR:   portable 06/30/24  Cardiomegaly. No acute cardiopulmonary process.   Labs 07/06/24   HC03   =   28  Assessment      Assessment & Plan Tracheostomy dependence (HCC) Trach but not 02 dep as of  07/28/2024 /  high risk recurrent aspiration  - Labs 07/06/24   HC03   =   28   He is getting 24 h care at home and so far doing well with suctioning s obvious asp being tube fed. However, not ablt to cough or really cooperate with efforts to mobilize/ cough effectively or handle secretions so it's a matter of time before he gets another infection eg pna/ uti or skin breakdown related and he is appropriately NCB status (but wife would want him ventilated again short term as discussed today).  Since  he is only requiring suctioning and no bronchodilators or 02 or vent at present there is no need to f/u here and with trach care team and we will see prn.  Discussed in detail all the  indications, usual  risks and alternatives  relative to the benefits with patient who agrees to proceed with Rx as outlined.             Each maintenance medication was reviewed in detail including emphasizing most importantly the difference between maintenance and prns and under what circumstances the prns are to be triggered using an action plan format where appropriate.  Total time for H and P, chart review, counseling, reviewing trach care device(s) and generating customized AVS unique to this Pacheco visit / same day charting = 36 min post hosp f/u ov with pt new to me.          Patient Instructions  No additional  pulmonary interventions are needed   Call if questions    Ozell America, MD 07/29/2024

## 2024-07-28 NOTE — Patient Instructions (Signed)
 No additional  pulmonary interventions are needed   Call if questions

## 2024-07-29 DIAGNOSIS — Z930 Tracheostomy status: Secondary | ICD-10-CM | POA: Insufficient documentation

## 2024-07-29 NOTE — Assessment & Plan Note (Addendum)
 Trach but not 02 dep as of  07/28/2024 /  high risk recurrent aspiration  - Labs 07/06/24   HC03   =   28   He is getting 24 h care at home and so far doing well with suctioning s obvious asp being tube fed. However, not ablt to cough or really cooperate with efforts to mobilize/ cough effectively or handle secretions so it's a matter of time before he gets another infection eg pna/ uti or skin breakdown related and he is appropriately NCB status (but wife would want him ventilated again short term as discussed today).  Since  he is only requiring suctioning and no bronchodilators or 02 or vent at present there is no need to f/u here and with trach care team and we will see prn.  Discussed in detail all the  indications, usual  risks and alternatives  relative to the benefits with patient who agrees to proceed with Rx as outlined.             Each maintenance medication was reviewed in detail including emphasizing most importantly the difference between maintenance and prns and under what circumstances the prns are to be triggered using an action plan format where appropriate.  Total time for H and P, chart review, counseling, reviewing trach care device(s) and generating customized AVS unique to this office visit / same day charting = 36 min post hosp f/u ov with pt new to me.

## 2024-08-02 DIAGNOSIS — G40909 Epilepsy, unspecified, not intractable, without status epilepticus: Secondary | ICD-10-CM | POA: Diagnosis not present

## 2024-08-02 DIAGNOSIS — Z930 Tracheostomy status: Secondary | ICD-10-CM | POA: Diagnosis not present

## 2024-08-02 DIAGNOSIS — I4821 Permanent atrial fibrillation: Secondary | ICD-10-CM | POA: Diagnosis not present

## 2024-08-02 DIAGNOSIS — G20A1 Parkinson's disease without dyskinesia, without mention of fluctuations: Secondary | ICD-10-CM | POA: Diagnosis not present

## 2024-08-02 DIAGNOSIS — J301 Allergic rhinitis due to pollen: Secondary | ICD-10-CM | POA: Diagnosis not present

## 2024-08-02 DIAGNOSIS — Z978 Presence of other specified devices: Secondary | ICD-10-CM | POA: Diagnosis not present

## 2024-08-02 DIAGNOSIS — I10 Essential (primary) hypertension: Secondary | ICD-10-CM | POA: Diagnosis not present

## 2024-08-02 DIAGNOSIS — Z789 Other specified health status: Secondary | ICD-10-CM | POA: Diagnosis not present

## 2024-08-02 DIAGNOSIS — K219 Gastro-esophageal reflux disease without esophagitis: Secondary | ICD-10-CM | POA: Diagnosis not present

## 2024-08-02 DIAGNOSIS — F3132 Bipolar disorder, current episode depressed, moderate: Secondary | ICD-10-CM | POA: Diagnosis not present

## 2024-08-02 DIAGNOSIS — Z7409 Other reduced mobility: Secondary | ICD-10-CM | POA: Diagnosis not present

## 2024-08-02 DIAGNOSIS — K5909 Other constipation: Secondary | ICD-10-CM | POA: Diagnosis not present

## 2024-08-05 DIAGNOSIS — F209 Schizophrenia, unspecified: Secondary | ICD-10-CM | POA: Diagnosis not present

## 2024-08-05 DIAGNOSIS — E7849 Other hyperlipidemia: Secondary | ICD-10-CM | POA: Diagnosis not present

## 2024-08-05 DIAGNOSIS — I4821 Permanent atrial fibrillation: Secondary | ICD-10-CM | POA: Diagnosis not present

## 2024-08-05 DIAGNOSIS — M4810 Ankylosing hyperostosis [Forestier], site unspecified: Secondary | ICD-10-CM | POA: Diagnosis not present

## 2024-08-12 DIAGNOSIS — E782 Mixed hyperlipidemia: Secondary | ICD-10-CM | POA: Diagnosis not present

## 2024-08-12 DIAGNOSIS — F3132 Bipolar disorder, current episode depressed, moderate: Secondary | ICD-10-CM | POA: Diagnosis not present

## 2024-08-12 DIAGNOSIS — I10 Essential (primary) hypertension: Secondary | ICD-10-CM | POA: Diagnosis not present

## 2024-08-12 DIAGNOSIS — G20A1 Parkinson's disease without dyskinesia, without mention of fluctuations: Secondary | ICD-10-CM | POA: Diagnosis not present

## 2024-08-16 ENCOUNTER — Institutional Professional Consult (permissible substitution) (INDEPENDENT_AMBULATORY_CARE_PROVIDER_SITE_OTHER): Admitting: Otolaryngology

## 2024-08-17 ENCOUNTER — Telehealth (INDEPENDENT_AMBULATORY_CARE_PROVIDER_SITE_OTHER): Payer: Self-pay | Admitting: Otolaryngology

## 2024-08-17 NOTE — Telephone Encounter (Signed)
 Patient's wife, Ernan Runkles, called and stated that she called  Pulmonary to schedule an appointment with Dr. [name] as recommended.  She was told that they do not have a Dr. [name] there at that office.  Please advise who she can call to get an appt scheduled.  She can be reached at [phone].

## 2024-08-19 DIAGNOSIS — K219 Gastro-esophageal reflux disease without esophagitis: Secondary | ICD-10-CM | POA: Diagnosis not present

## 2024-08-19 DIAGNOSIS — K5909 Other constipation: Secondary | ICD-10-CM | POA: Diagnosis not present

## 2024-08-19 DIAGNOSIS — I4821 Permanent atrial fibrillation: Secondary | ICD-10-CM | POA: Diagnosis not present

## 2024-08-19 DIAGNOSIS — J301 Allergic rhinitis due to pollen: Secondary | ICD-10-CM | POA: Diagnosis not present

## 2024-08-19 DIAGNOSIS — G20A1 Parkinson's disease without dyskinesia, without mention of fluctuations: Secondary | ICD-10-CM | POA: Diagnosis not present

## 2024-08-19 DIAGNOSIS — I10 Essential (primary) hypertension: Secondary | ICD-10-CM | POA: Diagnosis not present

## 2024-08-19 DIAGNOSIS — F3132 Bipolar disorder, current episode depressed, moderate: Secondary | ICD-10-CM | POA: Diagnosis not present

## 2024-08-19 DIAGNOSIS — Z978 Presence of other specified devices: Secondary | ICD-10-CM | POA: Diagnosis not present

## 2024-08-19 DIAGNOSIS — G40909 Epilepsy, unspecified, not intractable, without status epilepticus: Secondary | ICD-10-CM | POA: Diagnosis not present

## 2024-08-19 DIAGNOSIS — Z7409 Other reduced mobility: Secondary | ICD-10-CM | POA: Diagnosis not present

## 2024-08-19 DIAGNOSIS — Z789 Other specified health status: Secondary | ICD-10-CM | POA: Diagnosis not present

## 2024-08-19 DIAGNOSIS — Z930 Tracheostomy status: Secondary | ICD-10-CM | POA: Diagnosis not present

## 2024-08-19 NOTE — Telephone Encounter (Signed)
 Spoke to patient's spouse and informed her that someone from provider Babcock's office will contact them. Patient's spouse verbalizes understanding.

## 2024-09-01 ENCOUNTER — Ambulatory Visit (HOSPITAL_COMMUNITY)
Admission: RE | Admit: 2024-09-01 | Discharge: 2024-09-01 | Disposition: A | Discharge status: Home / Self Care | Source: Ambulatory Visit | Attending: Otolaryngology | Admitting: Otolaryngology

## 2024-09-01 DIAGNOSIS — N319 Neuromuscular dysfunction of bladder, unspecified: Secondary | ICD-10-CM | POA: Diagnosis not present

## 2024-09-01 DIAGNOSIS — G4733 Obstructive sleep apnea (adult) (pediatric): Secondary | ICD-10-CM | POA: Diagnosis not present

## 2024-09-01 DIAGNOSIS — Z930 Tracheostomy status: Secondary | ICD-10-CM

## 2024-09-01 DIAGNOSIS — I10 Essential (primary) hypertension: Secondary | ICD-10-CM | POA: Diagnosis not present

## 2024-09-01 DIAGNOSIS — I4891 Unspecified atrial fibrillation: Secondary | ICD-10-CM | POA: Insufficient documentation

## 2024-09-01 DIAGNOSIS — R1310 Dysphagia, unspecified: Secondary | ICD-10-CM | POA: Insufficient documentation

## 2024-09-01 DIAGNOSIS — Z430 Encounter for attention to tracheostomy: Secondary | ICD-10-CM | POA: Diagnosis present

## 2024-09-01 DIAGNOSIS — G20C Parkinsonism, unspecified: Secondary | ICD-10-CM | POA: Diagnosis not present

## 2024-09-01 DIAGNOSIS — G40909 Epilepsy, unspecified, not intractable, without status epilepticus: Secondary | ICD-10-CM | POA: Insufficient documentation

## 2024-09-01 DIAGNOSIS — G319 Degenerative disease of nervous system, unspecified: Secondary | ICD-10-CM

## 2024-09-01 NOTE — Progress Notes (Signed)
 Tracheostomy Procedure Note  KAIREN HALLINAN [id number] 08-Jun-1946  Pre Procedure Tracheostomy Information  Trach Brand: Bivona Aire-Cuf (Portex) adjusatable flange hyerflex Size: 7.0 Style: Cuffed Secured by: Velcro  Lidocaine neb given prior to trach change  Procedure: Trach change and Trach cleaning   Post Procedure Tracheostomy Information  Trach Brand: Bivona Aire-Cuf Hyperflex XL Fined Flange  Rer# [id number] Size: 7.0 Style: Uncuffed Secured by: Velcro   Post Procedure Evaluation:  ETCO2 positive color change from yellow to purple : Yes.   Vital signs:VSS Patients current condition: stable Complications: No apparent complications Trach site exam: clean, dry Wound care done: 4 x 4 gauze drain Patient did tolerate procedure well.   Education: Educated wife on changing trach in case of emergecy  Prescription needs: none    Additional needs: An additional trach Ref# [id number]  Hyperflex XL FIxed Flange given to wife at this visit

## 2024-09-01 NOTE — Consult Note (Signed)
 Reason for consult To establish at trach clinic  Consulting MD Patel ENT  HPI  78 year old male patient with multiple medical comorbidities which include schizophrenia, parkinsonian type tremor, seizure disorder, atrial fibrillation, not on anticoagulation due to recurrent hematuria, dysphagia, dysphagia, PEG dependent, neurogenic bladder with suprapubic catheter and OSA.  Currently resides at home under the care of his wife.  Requires assistance with all activities of daily living.  Prior to his stay at the long-term acute care setting he had had a prolonged hospital stay at [facility] in March 2025 for acute hypoxic respiratory failure.  This was complicated by progressive deconditioning status post prolonged critical illness and was further complicated by tracheostomy dislodgment, for which a adjustable size 7 cuffed Bivona was placed in March 2025.  Prior to prolonged hospitalization he was communicative, but does have baseline parkinsonian type tremor and severe deconditioning.  He had been at a point where he could sit almost unassisted.  Since his discharge from the LTAC setting he is tracheostomy dependent, bed/chair dependent, requires a lift to get out of bed and 100% assistance in all activities of daily living.  He is improved minimally since his discharge, at time of discharge in June he could not even raise his hands against gravity, now he can raise his hands against gravity, he nods and attempts to talk, but otherwise remains severely deconditioned.  Regarding tracheostomy he is on room air, he has had ongoing humidification at home via aerosol trach collar.  Wife reports she has to suction him in excess of 10 times a day.  He has had no recent fevers, coughs frequently, appears in no distress.  Had been seen initially by Dr. Tobie noting his specialty tracheostomy, and also noting given his current status that he was clearly not a candidate for decannulation.  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, tracheostomy dependence following prolonged hospital stay, PEG dependence for severe dysphagia, has suprapubic catheter  Allergies  Allergen Reactions   Penicillins Hives, Dermatitis and Other (See Comments)    Childhood reaction Patient has tolerated cefepime  and Rocephin  Pt's spouse stated it doesn't do anything for him   Azithromycin Dermatitis and Other (See Comments)    Altered mental status   Sulfa Antibiotics Hives, Dermatitis and Other (See Comments)    Pt unsure of reaction   Bupropion Dermatitis and Other (See Comments)    Patient and pt wife unsure of reaction   Metformin Diarrhea, Dermatitis and Other (See Comments)    Patient not sure   Family history Nonapplicable  Social history: Lives at home with his wife.  Dependent 100% for all activities of daily living   Current Outpatient Medications:    acetaminophen  (TYLENOL ) 160 MG/5ML elixir, Take 650 mg by mouth every 6 (six) hours as needed for fever or pain., Disp: , Rfl:    acetic acid  0.25 % irrigation, Irrigate with as directed 2 (two) times daily. Instill 60cc twice a day through suprapubic tube to keep catheter patent, Disp: 500 mL, Rfl: 12   AMBULATORY NON FORMULARY MEDICATION, Home health nurse may exchange 22 french suprapubic catheter (SP tube) at home as needed., Disp: 1 each, Rfl: PRN   carbidopa -levodopa  (PARCOPA ) 25-100 MG disintegrating tablet, Take 2 tablets by mouth 4 (four) times daily., Disp: , Rfl:    clotrimazole  (  LOTRIMIN ) 1 % cream, Apply 1 application. topically 2 (two) times daily., Disp: 30 g, Rfl: 0   lacosamide  (VIMPAT ) 10 MG/ML oral solution, Take 20 mLs by mouth 2 (two) times daily. 20 ml by mouth twice a day, Disp: , Rfl:     levETIRAcetam  (KEPPRA ) 500 MG tablet, Take 500 mg by mouth 2 (two) times daily., Disp: , Rfl:    metoprolol  tartrate (LOPRESSOR ) 25 MG tablet, Place 0.5 tablets (12.5 mg total) into feeding tube every 6 (six) hours., Disp: 120 tablet, Rfl: 1   mirtazapine (REMERON) 7.5 MG tablet, Take 7.5 mg by mouth at bedtime., Disp: , Rfl:    Multiple Vitamins-Iron (TAB-A-VITE/IRON/BETA CAROTENE) TABS, Take 1 tablet by mouth daily., Disp: , Rfl:    Nutritional Supplements (FEEDING SUPPLEMENT, OSMOLITE 1.2 CAL,) LIQD, Place 1,000 mLs into feeding tube daily. 2 quarts every day, Disp: , Rfl:    nystatin (MYCOSTATIN/NYSTOP) powder, Apply 1 application topically 3 (three) times daily., Disp: , Rfl:    omeprazole (PRILOSEC) 40 MG capsule, Take 40 mg by mouth daily., Disp: , Rfl:    OVER THE COUNTER MEDICATION, Apply 1 Application topically daily. Apply for diaper rash Desyten cream, Disp: , Rfl:    PACERONE  200 MG tablet, Take 200 mg by mouth daily., Disp: , Rfl:    polyethylene glycol (MIRALAX  / GLYCOLAX ) 17 g packet, Take 17 g by mouth daily., Disp: , Rfl:    protein supplement (PROSOURCE NO CARB) LIQD, Place 30 mLs into feeding tube daily., Disp: , Rfl:    risperiDONE (RISPERDAL M-TABS) 2 MG disintegrating tablet, Take 2 mg by mouth at bedtime., Disp: , Rfl:    scopolamine  (TRANSDERM-SCOP) 1 MG/3DAYS, Place 1 patch onto the skin every 3 (three) days as needed (nausea/motion sickness)., Disp: , Rfl:    thiamine (VITAMIN B1) 100 MG tablet, Take 100 mg by mouth daily., Disp: , Rfl:    Valproate Sodium  (DEPAKENE ) 250 MG/5ML SOLN solution, Place 5 mLs (250 mg total) into feeding tube 3 (three) times daily., Disp: 450 mL, Rfl: 0   vitamin B-12 (CYANOCOBALAMIN) 100 MCG tablet, Take 100 mcg by mouth daily., Disp: , Rfl:   Current Facility-Administered Medications:    silver  nitrate applicators applicator 1 Application, 1 Application, Topical, Once, [place], Julienne Annette, PA-C  ROS Unable  Exam  Pulse  oximetry continuously monitoring, poor waveform but no distress maintained greater 90%  General This is a severely debilitated 78 year old male patient he is bed and wheelchair bound, PEG and trach dependent.  Presents to the tracheostomy clinic today accompanied by his wife he appears to be in no acute distress HEENT mucous membranes are moist, has thin clear tracheostomy secretions, requires suction frequently.  Prior to tracheostomy change she had a size 7 adjustable cuffed tracheostomy, this was a Portex Bivona.  He now has a Portex Bivona fixed length #7 cuffless trach.  His stoma is unremarkable, he can make some phonation noises but mostly unintelligible Pulmonary currently on room air coarse scattered rhonchi requiring frequent suctioning Cardiac irregular irregular with atrial fibrillation history Abdomen soft nontender has a PEG as well as a suprapubic Extremities warm dry dependent edema Neuro he is awake, attempts to answer questions but is unintelligible, he can follow commands but he is profoundly weak he has an ongoing parkinsonian type tremor involving both his upper extremities  Procedure Using a bronchoscope the #7 adjustable tracheostomy site was evaluated.  Following nebulized lidocaine the bronchoscope was passed via the tracheostomy, the distal tip of the tracheostomy  was approximately 4 cm above the carina.  He had notable dynamic airway collapse specifically with coughing but to some degree with just respiratory function as well. Following this the tracheostomy was removed, a new size 7 cuffless Portex fixed extended length was placed.  Placement again verified via bronchoscope as well as end-tidal CO2.  Of note current tracheostomy length is 110 mm.  This was actually the length of his adjustable trach.  Patient tolerated well  Active Problems:   Tracheostomy dependence (HCC)   OSA (obstructive sleep apnea)   Cerebral atrophy (HCC)   Tracheostomy dependence  (HCC) Overview: Trach dep d/t ineffective airway protection and severe deconditioning.  Underlying OSA High risk for asp PNA  Trach # 7 cuffless Portex ref # [id number] Changed from adjustable cuffed trach 9/18 DME: [place]  [facility] phone # [phone], extension number is [phone] case manager Lori [place]  Discussion  Tracheostomy dependent following prolonged critical illness, further complicated by ineffective cough with ineffective airway protection, severe deconditioning, and what looks like tracheomalacia.  At this point I am doubtful he will be a candidate for decannulation  Plan We will continue every 8-week tracheostomy changes, ultimately the goal will be to teach his wife to change the tracheostomy, and then we will alternate changes every 8 weeks Continue routine tracheostomy care I told his wife she can try changing humidified trach collar to just at at bedtime, and then add back for a couple hours during the daytime if secretions become more difficult to suction If secretions are improved on next visit we can attempt PMV evaluation in the clinic     My time 43 minutes

## 2024-09-02 NOTE — Plan of Care (Signed)
 Tracheostomy Home Health Order Sheet  Cory Pacheco Tracheostomy Clinic: [facility] Respiratory Care Department [address]  [place] [address]  [phone]  Fax [phone]  (Fax order requests to this number)  Attention Dr Burt  Fax# [phone]  Cory Pacheco  [birth year]  [id number]   Please order the following    Portex, hyperflex XL;  size 7,  uncuffed reference # [id number]  Quantity 1/mo Refill 12   Backup trach   Portex hyperflex XL , uncuffed, and size 6 Quantity 1/mo Refill 12 reference # [id number]   [name] ACNP NPI# [id number] [facility]  [address] [place], [place] [address]     _______________________________________________.

## 2024-09-07 DIAGNOSIS — F3132 Bipolar disorder, current episode depressed, moderate: Secondary | ICD-10-CM | POA: Diagnosis not present

## 2024-09-07 DIAGNOSIS — G20A1 Parkinson's disease without dyskinesia, without mention of fluctuations: Secondary | ICD-10-CM | POA: Diagnosis not present

## 2024-09-07 DIAGNOSIS — Z978 Presence of other specified devices: Secondary | ICD-10-CM | POA: Diagnosis not present

## 2024-09-07 DIAGNOSIS — K5909 Other constipation: Secondary | ICD-10-CM | POA: Diagnosis not present

## 2024-09-07 DIAGNOSIS — K219 Gastro-esophageal reflux disease without esophagitis: Secondary | ICD-10-CM | POA: Diagnosis not present

## 2024-09-07 DIAGNOSIS — I10 Essential (primary) hypertension: Secondary | ICD-10-CM | POA: Diagnosis not present

## 2024-09-07 DIAGNOSIS — Z930 Tracheostomy status: Secondary | ICD-10-CM | POA: Diagnosis not present

## 2024-09-07 DIAGNOSIS — I4821 Permanent atrial fibrillation: Secondary | ICD-10-CM | POA: Diagnosis not present

## 2024-09-07 DIAGNOSIS — Z7409 Other reduced mobility: Secondary | ICD-10-CM | POA: Diagnosis not present

## 2024-09-07 DIAGNOSIS — J301 Allergic rhinitis due to pollen: Secondary | ICD-10-CM | POA: Diagnosis not present

## 2024-09-07 DIAGNOSIS — G40909 Epilepsy, unspecified, not intractable, without status epilepticus: Secondary | ICD-10-CM | POA: Diagnosis not present

## 2024-09-07 DIAGNOSIS — Z789 Other specified health status: Secondary | ICD-10-CM | POA: Diagnosis not present

## 2024-09-09 DIAGNOSIS — Z23 Encounter for immunization: Secondary | ICD-10-CM | POA: Diagnosis not present

## 2024-09-09 DIAGNOSIS — Z515 Encounter for palliative care: Secondary | ICD-10-CM | POA: Diagnosis not present

## 2024-09-09 DIAGNOSIS — G20C Parkinsonism, unspecified: Secondary | ICD-10-CM | POA: Diagnosis not present

## 2024-09-12 DIAGNOSIS — F3132 Bipolar disorder, current episode depressed, moderate: Secondary | ICD-10-CM | POA: Diagnosis not present

## 2024-09-12 DIAGNOSIS — I10 Essential (primary) hypertension: Secondary | ICD-10-CM | POA: Diagnosis not present

## 2024-09-12 DIAGNOSIS — G20A1 Parkinson's disease without dyskinesia, without mention of fluctuations: Secondary | ICD-10-CM | POA: Diagnosis not present

## 2024-09-12 DIAGNOSIS — E782 Mixed hyperlipidemia: Secondary | ICD-10-CM | POA: Diagnosis not present

## 2024-09-16 DIAGNOSIS — I4821 Permanent atrial fibrillation: Secondary | ICD-10-CM | POA: Diagnosis not present

## 2024-09-16 DIAGNOSIS — G20A1 Parkinson's disease without dyskinesia, without mention of fluctuations: Secondary | ICD-10-CM | POA: Diagnosis not present

## 2024-09-16 DIAGNOSIS — J301 Allergic rhinitis due to pollen: Secondary | ICD-10-CM | POA: Diagnosis not present

## 2024-09-16 DIAGNOSIS — Z930 Tracheostomy status: Secondary | ICD-10-CM | POA: Diagnosis not present

## 2024-09-16 DIAGNOSIS — Z789 Other specified health status: Secondary | ICD-10-CM | POA: Diagnosis not present

## 2024-09-16 DIAGNOSIS — G40909 Epilepsy, unspecified, not intractable, without status epilepticus: Secondary | ICD-10-CM | POA: Diagnosis not present

## 2024-09-16 DIAGNOSIS — Z7409 Other reduced mobility: Secondary | ICD-10-CM | POA: Diagnosis not present

## 2024-09-16 DIAGNOSIS — Z978 Presence of other specified devices: Secondary | ICD-10-CM | POA: Diagnosis not present

## 2024-09-16 DIAGNOSIS — K219 Gastro-esophageal reflux disease without esophagitis: Secondary | ICD-10-CM | POA: Diagnosis not present

## 2024-09-16 DIAGNOSIS — F3132 Bipolar disorder, current episode depressed, moderate: Secondary | ICD-10-CM | POA: Diagnosis not present

## 2024-09-16 DIAGNOSIS — K5909 Other constipation: Secondary | ICD-10-CM | POA: Diagnosis not present

## 2024-09-16 DIAGNOSIS — I10 Essential (primary) hypertension: Secondary | ICD-10-CM | POA: Diagnosis not present

## 2024-09-19 ENCOUNTER — Ambulatory Visit

## 2024-09-20 ENCOUNTER — Ambulatory Visit

## 2024-09-20 DIAGNOSIS — N319 Neuromuscular dysfunction of bladder, unspecified: Secondary | ICD-10-CM

## 2024-09-20 DIAGNOSIS — R339 Retention of urine, unspecified: Secondary | ICD-10-CM | POA: Diagnosis not present

## 2024-09-20 LAB — URINALYSIS, ROUTINE W REFLEX MICROSCOPIC
Bilirubin, UA: NEGATIVE
Glucose, UA: NEGATIVE
Ketones, UA: NEGATIVE
Nitrite, UA: NEGATIVE
Specific Gravity, UA: 1.01 (ref 1.005–1.030)
Urobilinogen, Ur: 1 mg/dL (ref 0.2–1.0)
pH, UA: 6 (ref 5.0–7.5)

## 2024-09-20 LAB — MICROSCOPIC EXAMINATION
RBC, Urine: >30 /HPF — AB (ref 0–2)
WBC, UA: >30 /HPF — AB (ref 0–5)

## 2024-09-20 MED ORDER — CIPROFLOXACIN HCL 500 MG PO TABS
500.0000 mg | ORAL_TABLET | Freq: Once | ORAL | Status: AC
  Administered 2024-09-20: 500 mg via ORAL

## 2024-09-20 NOTE — Progress Notes (Signed)
 Suprapubic Cath Change  Patient is present today for a suprapubic catheter change due to urinary retention.  10ml of water  was drained from the balloon, a 22FR foley cath was removed from the tract with out difficulty.  Suprapubic catheter site was cleaned and prepped in a sterile fashion with Betadinex3  A 22FR foley cath was replaced into the tract no complications were noted. Urine return was noted, urine Amber in color . 10 ml of sterile water  was inflated into the balloon and a nightbag bag was attached for drainage.  Patient tolerated well. A night bag was given to patient and proper instruction was given on how to switch bags.    Performed by: Carlos, CMA  Follow up: Keep nurse visited and OV

## 2024-09-21 ENCOUNTER — Telehealth: Payer: Self-pay

## 2024-09-21 ENCOUNTER — Other Ambulatory Visit: Payer: Self-pay

## 2024-09-21 DIAGNOSIS — N319 Neuromuscular dysfunction of bladder, unspecified: Secondary | ICD-10-CM

## 2024-09-21 MED ORDER — DOXYCYCLINE HYCLATE 100 MG PO CAPS: 100.0000 mg | ORAL_CAPSULE | Freq: Two times a day (BID) | ORAL | 0 refills | Status: AC

## 2024-09-21 NOTE — Telephone Encounter (Signed)
 Patient presents today with complaints of  Recurrent UTI .  UA and Culture done today.  Dr. Sherrilee reviewed results and Doxycycline  100 mg BID X 7 days .  Patient aware of MD recommendations and that we will reach out with culture results.      Dyjwpvlj, CMA

## 2024-09-23 ENCOUNTER — Ambulatory Visit: Payer: Self-pay

## 2024-09-23 LAB — URINE CULTURE

## 2024-09-26 DIAGNOSIS — Z930 Tracheostomy status: Secondary | ICD-10-CM | POA: Diagnosis not present

## 2024-09-26 DIAGNOSIS — G40909 Epilepsy, unspecified, not intractable, without status epilepticus: Secondary | ICD-10-CM | POA: Diagnosis not present

## 2024-09-26 DIAGNOSIS — Z978 Presence of other specified devices: Secondary | ICD-10-CM | POA: Diagnosis not present

## 2024-09-26 DIAGNOSIS — I4821 Permanent atrial fibrillation: Secondary | ICD-10-CM | POA: Diagnosis not present

## 2024-09-26 DIAGNOSIS — G20A1 Parkinson's disease without dyskinesia, without mention of fluctuations: Secondary | ICD-10-CM | POA: Diagnosis not present

## 2024-09-26 DIAGNOSIS — K5909 Other constipation: Secondary | ICD-10-CM | POA: Diagnosis not present

## 2024-09-26 DIAGNOSIS — J301 Allergic rhinitis due to pollen: Secondary | ICD-10-CM | POA: Diagnosis not present

## 2024-09-26 DIAGNOSIS — Z789 Other specified health status: Secondary | ICD-10-CM | POA: Diagnosis not present

## 2024-09-26 DIAGNOSIS — Z7409 Other reduced mobility: Secondary | ICD-10-CM | POA: Diagnosis not present

## 2024-09-26 DIAGNOSIS — K219 Gastro-esophageal reflux disease without esophagitis: Secondary | ICD-10-CM | POA: Diagnosis not present

## 2024-09-26 DIAGNOSIS — I10 Essential (primary) hypertension: Secondary | ICD-10-CM | POA: Diagnosis not present

## 2024-09-26 DIAGNOSIS — F3132 Bipolar disorder, current episode depressed, moderate: Secondary | ICD-10-CM | POA: Diagnosis not present

## 2024-10-11 DIAGNOSIS — Z978 Presence of other specified devices: Secondary | ICD-10-CM | POA: Diagnosis not present

## 2024-10-11 DIAGNOSIS — G20A1 Parkinson's disease without dyskinesia, without mention of fluctuations: Secondary | ICD-10-CM | POA: Diagnosis not present

## 2024-10-11 DIAGNOSIS — Z789 Other specified health status: Secondary | ICD-10-CM | POA: Diagnosis not present

## 2024-10-11 DIAGNOSIS — J301 Allergic rhinitis due to pollen: Secondary | ICD-10-CM | POA: Diagnosis not present

## 2024-10-11 DIAGNOSIS — K5909 Other constipation: Secondary | ICD-10-CM | POA: Diagnosis not present

## 2024-10-11 DIAGNOSIS — I10 Essential (primary) hypertension: Secondary | ICD-10-CM | POA: Diagnosis not present

## 2024-10-11 DIAGNOSIS — F3132 Bipolar disorder, current episode depressed, moderate: Secondary | ICD-10-CM | POA: Diagnosis not present

## 2024-10-11 DIAGNOSIS — Z930 Tracheostomy status: Secondary | ICD-10-CM | POA: Diagnosis not present

## 2024-10-11 DIAGNOSIS — I4821 Permanent atrial fibrillation: Secondary | ICD-10-CM | POA: Diagnosis not present

## 2024-10-11 DIAGNOSIS — G40909 Epilepsy, unspecified, not intractable, without status epilepticus: Secondary | ICD-10-CM | POA: Diagnosis not present

## 2024-10-11 DIAGNOSIS — Z7409 Other reduced mobility: Secondary | ICD-10-CM | POA: Diagnosis not present

## 2024-10-11 DIAGNOSIS — K219 Gastro-esophageal reflux disease without esophagitis: Secondary | ICD-10-CM | POA: Diagnosis not present

## 2024-10-14 DIAGNOSIS — F3132 Bipolar disorder, current episode depressed, moderate: Secondary | ICD-10-CM | POA: Diagnosis not present

## 2024-10-14 DIAGNOSIS — G20A1 Parkinson's disease without dyskinesia, without mention of fluctuations: Secondary | ICD-10-CM | POA: Diagnosis not present

## 2024-10-14 DIAGNOSIS — I10 Essential (primary) hypertension: Secondary | ICD-10-CM | POA: Diagnosis not present

## 2024-10-14 DIAGNOSIS — E782 Mixed hyperlipidemia: Secondary | ICD-10-CM | POA: Diagnosis not present

## 2024-10-24 ENCOUNTER — Ambulatory Visit

## 2024-10-24 DIAGNOSIS — R339 Retention of urine, unspecified: Secondary | ICD-10-CM | POA: Diagnosis not present

## 2024-10-24 DIAGNOSIS — N319 Neuromuscular dysfunction of bladder, unspecified: Secondary | ICD-10-CM

## 2024-10-24 MED ORDER — CIPROFLOXACIN HCL 500 MG PO TABS
500.0000 mg | ORAL_TABLET | Freq: Once | ORAL | Status: AC
  Administered 2024-10-24: 500 mg via ORAL

## 2024-10-24 NOTE — Progress Notes (Signed)
 Suprapubic Cath Change  Patient is present today for a suprapubic catheter change due to urinary retention.  10 ml of water  was drained from the balloon, a 22 FR foley cath was removed from the tract with out difficulty.  Suprapubic catheter site was cleaned and prepped in a sterile fashion with Betadinex3  A 22 FR foley cath was replaced into the tract no complications were noted. Urine return was noted, urine Clear yellow in color . 10 ml of sterile water  was inflated into the balloon and a night bag was attached for drainage.  Patient tolerated well. A night bag was given to patient and proper instruction was given on how to switch bags.    Performed by: Carlos, CMA  Follow up: Keep monthly SP tube change

## 2024-10-25 DIAGNOSIS — Z515 Encounter for palliative care: Secondary | ICD-10-CM | POA: Diagnosis not present

## 2024-10-25 DIAGNOSIS — G20C Parkinsonism, unspecified: Secondary | ICD-10-CM | POA: Diagnosis not present

## 2024-10-28 ENCOUNTER — Ambulatory Visit (HOSPITAL_COMMUNITY)
Admission: RE | Admit: 2024-10-28 | Discharge: 2024-10-28 | Disposition: A | Discharge status: Home / Self Care | Source: Ambulatory Visit | Attending: Acute Care | Admitting: Acute Care

## 2024-10-28 DIAGNOSIS — G20B1 Parkinson's disease with dyskinesia, without mention of fluctuations: Secondary | ICD-10-CM | POA: Diagnosis not present

## 2024-10-28 DIAGNOSIS — K219 Gastro-esophageal reflux disease without esophagitis: Secondary | ICD-10-CM | POA: Diagnosis not present

## 2024-10-28 DIAGNOSIS — Z7409 Other reduced mobility: Secondary | ICD-10-CM | POA: Diagnosis not present

## 2024-10-28 DIAGNOSIS — Z930 Tracheostomy status: Secondary | ICD-10-CM

## 2024-10-28 DIAGNOSIS — G4733 Obstructive sleep apnea (adult) (pediatric): Secondary | ICD-10-CM | POA: Diagnosis not present

## 2024-10-28 DIAGNOSIS — G40909 Epilepsy, unspecified, not intractable, without status epilepticus: Secondary | ICD-10-CM | POA: Diagnosis not present

## 2024-10-28 DIAGNOSIS — Z789 Other specified health status: Secondary | ICD-10-CM | POA: Diagnosis not present

## 2024-10-28 DIAGNOSIS — J301 Allergic rhinitis due to pollen: Secondary | ICD-10-CM | POA: Diagnosis not present

## 2024-10-28 DIAGNOSIS — F3132 Bipolar disorder, current episode depressed, moderate: Secondary | ICD-10-CM | POA: Diagnosis not present

## 2024-10-28 DIAGNOSIS — I4821 Permanent atrial fibrillation: Secondary | ICD-10-CM | POA: Diagnosis not present

## 2024-10-28 DIAGNOSIS — G20A1 Parkinson's disease without dyskinesia, without mention of fluctuations: Secondary | ICD-10-CM | POA: Diagnosis not present

## 2024-10-28 DIAGNOSIS — Z978 Presence of other specified devices: Secondary | ICD-10-CM | POA: Diagnosis not present

## 2024-10-28 DIAGNOSIS — K5909 Other constipation: Secondary | ICD-10-CM | POA: Diagnosis not present

## 2024-10-28 DIAGNOSIS — I10 Essential (primary) hypertension: Secondary | ICD-10-CM | POA: Diagnosis not present

## 2024-10-28 NOTE — Progress Notes (Signed)
 Tracheostomy Procedure Note  [name] [id number] 03/28/1946  Pre Procedure Tracheostomy Information  Trach Brand: Portex Bivona Size: 7.0  [id number] Style: Uncuffed Secured by: Velcro   Procedure: Trach Change and Trach Cleaning    Post Procedure Tracheostomy Information  Trach Brand: Portex Size: 7.0  [id number] Style: Uncuffed Secured by: Velcro   Post Procedure Evaluation:  ETCO2 positive color change from yellow to purple : Yes.   Vital signs:VSS Patients current condition: stable Complications: No apparent complications Trach site exam: clean, dry Wound care done: 4 x 4 gauze  drain Patient did tolerate procedure well.   Education: none  Prescription needs: none    Additional needs:  PMV trial total with instructions for  PMV usage at home

## 2024-10-28 NOTE — Progress Notes (Signed)
 Reason for consult To establish at trach clinic  Consulting MD Patel ENT  HPI  78 year old male patient with multiple medical comorbidities which include schizophrenia, parkinsonian type tremor, seizure disorder, atrial fibrillation, not on anticoagulation due to recurrent hematuria, dysphagia, dysphagia, PEG dependent, neurogenic bladder with suprapubic catheter and OSA.  Currently resides at home under the care of his wife.  Requires assistance with all activities of daily living.  Prior to his stay at the long-term acute care setting he had had a prolonged hospital stay at [facility] in March 2025 for acute hypoxic respiratory failure.  This was complicated by progressive deconditioning status post prolonged critical illness and was further complicated by tracheostomy dislodgment, for which a adjustable size 7 cuffed Bivona was placed in March 2025.  Prior to prolonged hospitalization he was communicative, but does have baseline parkinsonian type tremor and severe deconditioning.  He had been at a point where he could sit almost unassisted.  Since his discharge from the LTAC setting he is tracheostomy dependent, bed/chair dependent, requires a lift to get out of bed and 100% assistance in all activities of daily living.  He is improved minimally since his discharge, at time of discharge in June he could not even raise his hands against gravity, now he can raise his hands against gravity, he nods and attempts to talk, but otherwise remains severely deconditioned.  Presents today for planned trach change. Has been doing well at home. Wife reports sxn requirements less. Able to phonate at times. 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 No distress  HENT NCAT has 7 fixed length XL portex. No stridor w/ trach occlusion. Strong cough. Phonation quality moderate but easily understood Pulm occasional rhonchi Card rrr Abd PEG Ext dep edema Neuro awake generalized weakness. No focal def Gu foley in place   Procedure Current trach removed. Site inspected no evidence of infection. New # 7 trach placed over obturator. Pt tolerated well. 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  Underlying OSA High risk for asp PNA  Trach # 7 cuffless Portex ref # [id number] Last change 11/14 DME: [place]  [facility] phone # [phone], extension number is [phone] case manager Lori [place]  Discussion  Tracheostomy dependent following prolonged critical illness, further complicated by ineffective cough with ineffective airway protection, severe deconditioning, and what looks like tracheomalacia. I still doubt given his advanced age and degree of deconditioning we will be able to liberate him from the trach. However I do think we can work towards at least maximizing use of PMV now that his secretions are better. This would hopefully have a positive impact on his QOL  Plan We will continue every 8-week tracheostomy changes Wife interested in doing trach changes in future. We can start training her on that next visit  Continue routine tracheostomy care Cont humidified trach collar to just at at  bedtime, and then add back for a  couple hours during the daytime if secretions become more difficult to suction PMV as tolerated but be certain to take off when not attended to and at [facility]      This visit: review of most recent records, direct face to face time obtaining history, performing physical exam, developing and documenting plan as well as discussing this plan with the patient and/or care givers.  My billing time 41m  level 3: 99213  (>20 min)

## 2024-11-13 DIAGNOSIS — I4821 Permanent atrial fibrillation: Secondary | ICD-10-CM | POA: Diagnosis not present

## 2024-11-13 DIAGNOSIS — E782 Mixed hyperlipidemia: Secondary | ICD-10-CM | POA: Diagnosis not present

## 2024-11-13 DIAGNOSIS — F3132 Bipolar disorder, current episode depressed, moderate: Secondary | ICD-10-CM | POA: Diagnosis not present

## 2024-11-13 DIAGNOSIS — I10 Essential (primary) hypertension: Secondary | ICD-10-CM | POA: Diagnosis not present

## 2024-11-16 DIAGNOSIS — G20A1 Parkinson's disease without dyskinesia, without mention of fluctuations: Secondary | ICD-10-CM | POA: Diagnosis not present

## 2024-11-16 DIAGNOSIS — Z789 Other specified health status: Secondary | ICD-10-CM | POA: Diagnosis not present

## 2024-11-16 DIAGNOSIS — F3132 Bipolar disorder, current episode depressed, moderate: Secondary | ICD-10-CM | POA: Diagnosis not present

## 2024-11-16 DIAGNOSIS — K219 Gastro-esophageal reflux disease without esophagitis: Secondary | ICD-10-CM | POA: Diagnosis not present

## 2024-11-16 DIAGNOSIS — Z7409 Other reduced mobility: Secondary | ICD-10-CM | POA: Diagnosis not present

## 2024-11-16 DIAGNOSIS — J301 Allergic rhinitis due to pollen: Secondary | ICD-10-CM | POA: Diagnosis not present

## 2024-11-16 DIAGNOSIS — Z978 Presence of other specified devices: Secondary | ICD-10-CM | POA: Diagnosis not present

## 2024-11-16 DIAGNOSIS — G40909 Epilepsy, unspecified, not intractable, without status epilepticus: Secondary | ICD-10-CM | POA: Diagnosis not present

## 2024-11-16 DIAGNOSIS — I4821 Permanent atrial fibrillation: Secondary | ICD-10-CM | POA: Diagnosis not present

## 2024-11-16 DIAGNOSIS — Z930 Tracheostomy status: Secondary | ICD-10-CM | POA: Diagnosis not present

## 2024-11-16 DIAGNOSIS — K5909 Other constipation: Secondary | ICD-10-CM | POA: Diagnosis not present

## 2024-11-22 ENCOUNTER — Ambulatory Visit

## 2024-11-29 ENCOUNTER — Ambulatory Visit

## 2024-11-29 DIAGNOSIS — N319 Neuromuscular dysfunction of bladder, unspecified: Secondary | ICD-10-CM

## 2024-11-29 MED ORDER — CIPROFLOXACIN HCL 500 MG PO TABS
500.0000 mg | ORAL_TABLET | Freq: Once | ORAL | Status: AC
  Administered 2024-11-29: 10:00:00 500 mg via ORAL

## 2024-11-29 NOTE — Progress Notes (Signed)
 Suprapubic Cath Change  Patient is present today for a suprapubic catheter change due to urinary retention.  10 ml of water  was drained from the balloon, a 22 FR foley cath was removed from the tract with out difficulty.  Suprapubic catheter site was cleaned and prepped in a sterile fashion with Betadinex3  A 22 FR foley cath was replaced into the tract no complications were noted. Urine return was noted, urine Clear yellow in color . 10 ml of sterile water  was inflated into the balloon and a night bag was attached for drainage.  Patient tolerated well. A night bag was given to patient and proper instruction was given on how to switch bags.    Performed by: Carlos, CMA  Follow up: 4 weeks Cath Change

## 2024-12-27 ENCOUNTER — Ambulatory Visit (HOSPITAL_COMMUNITY)
Admission: RE | Admit: 2024-12-27 | Discharge: 2024-12-27 | Disposition: A | Discharge status: Home / Self Care | Source: Ambulatory Visit | Attending: Family Medicine | Admitting: Family Medicine

## 2024-12-27 DIAGNOSIS — G4733 Obstructive sleep apnea (adult) (pediatric): Secondary | ICD-10-CM

## 2024-12-27 DIAGNOSIS — Z930 Tracheostomy status: Secondary | ICD-10-CM | POA: Diagnosis not present

## 2024-12-27 NOTE — Progress Notes (Signed)
 Tracheostomy Procedure Note  Cory Pacheco [id number] [birth year]  Pre Procedure Tracheostomy Information  Trach Brand: Portex Bivona Size: 60AFHXL70 Style: Uncuffed Secured by: Velcro   Procedure: Trach Change and Trach Cleaning    Post Procedure Tracheostomy Information  Trach Brand: Portex Bivona Size: 60AFHXL70 Style: Uncuffed Secured by: Velcro   Post Procedure Evaluation:  ETCO2 positive color change from yellow to purple : Yes.   Vital signs:VSS Patients current condition: stable Complications: No apparent complications Trach site exam: clean, dry Wound care done: 4 x 4 gauze dain Patient did tolerate procedure well.   Education: none  Prescription needs: none    Additional needs: New PMV given to patient at this visit

## 2024-12-27 NOTE — Progress Notes (Addendum)
 CC:  Trach change (routine)   79 year old male who is trach dependent since march 2025 after prolonged hospital and LTAC stay. Has multiple medical comorbidities which include schizophrenia, parkinsonian type tremor, seizure disorder, atrial fibrillation, not on anticoagulation due to recurrent hematuria, dysphagia, dysphagia, PEG dependent, neurogenic bladder with suprapubic catheter and OSA.  Currently resides at home under the care of his wife.  Requires assistance with all activities of daily living.He requires complete assist with all ADLs. Is Bed/WC bound, PEG dependent and is minimally verbal.   Returns to trach clinic today for routine trach change.  Wife speaking on his behalf. No new fever, chills, sick exposures, change in sputum volume or consistency. No increased need for sxn. No new concerns voiced    ROS Unable  Exam Pulse ox 90s room air   General 79 year old male. He is currently in [facility]. Does not appear to be in any distress HENT NCAT the # 7 protex biovona was midline. After removal trach stoma evaluation was unremarkable w/out ulceration or secretions of concern Pulm no inc'd WOB. Occ scattered rhonchi  Card rrr Abd soft has PEG Ext warm  Neuro a little more sleepy today   Procedure Current trach removed. Site inspected no evidence of infection. New # 7 trach placed over obturator. Pt tolerated well. Placement confirmed via ETCO2   Active Problems:   Tracheostomy dependence (HCC)   Tracheostomy dependence (HCC) Overview: Trach dep d/t ineffective airway protection and severe deconditioning.  Underlying OSA High risk for asp PNA  Trach # 7 cuffless Portex ref # [id number] Last change today 12/28/23 DME: [place]  [facility] phone # [phone], extension number is [phone] case manager Lori [place]  Discussion  Tracheostomy dependent following prolonged critical illness, further complicated by ineffective cough with ineffective airway protection,  severe deconditioning, and what looks like tracheomalacia. Last time I encouraged PMV as tolerated. He can tolerate this intermittently but often secretions are barrier   Plan We will continue every 12-week tracheostomy changes Wife interested in doing trach changes in future. Wife watched today. Will let her try next time  Continue routine tracheostomy care Cont humidified trach collar to just at at bedtime, and then add back for a couple hours during the daytime if secretions become more difficult to suction PMV as tolerated but be certain to take off when not attended to and at [facility] and only to use while supervised       This visit: review of most recent records, direct face to face time obtaining history, performing physical exam, developing and documenting plan as well as discussing this plan with the patient and/or care givers.  My billing time 17 level 2:  99212 (>10min)

## 2025-01-03 ENCOUNTER — Ambulatory Visit

## 2025-01-03 DIAGNOSIS — N319 Neuromuscular dysfunction of bladder, unspecified: Secondary | ICD-10-CM

## 2025-01-03 DIAGNOSIS — R339 Retention of urine, unspecified: Secondary | ICD-10-CM

## 2025-01-03 MED ORDER — CIPROFLOXACIN HCL 500 MG PO TABS
500.0000 mg | ORAL_TABLET | Freq: Once | ORAL | Status: AC
  Administered 2025-01-03: 500 mg via ORAL

## 2025-01-03 NOTE — Progress Notes (Addendum)
 Suprapubic Cath Change  Patient is present today for a suprapubic catheter change due to urinary retention.  10 ml of water  was drained from the balloon, a 22 FR foley cath was removed from the tract with out difficulty.  Suprapubic catheter site was cleaned and prepped in a sterile fashion with Betadinex3  A 22 FR foley cath was replaced into the tract no complications were noted. Flush 30 cc return was noted, urine Clear yellow in color . 10 ml of sterile water  was inflated into the balloon and a night  bag was attached for drainage.  Patient tolerated well. A night bag was given to patient and proper instruction was given on how to switch bags.    Performed by: Carlos, CMA  Follow up: 4 weeks SP Tube Changed

## 2025-01-30 ENCOUNTER — Ambulatory Visit (INDEPENDENT_AMBULATORY_CARE_PROVIDER_SITE_OTHER): Admitting: Otolaryngology

## 2025-02-07 ENCOUNTER — Ambulatory Visit

## 2025-02-20 ENCOUNTER — Ambulatory Visit: Admitting: Urology

## 2025-02-21 ENCOUNTER — Ambulatory Visit (HOSPITAL_COMMUNITY)
